# Patient Record
Sex: Male | Born: 1940 | Race: White | Hispanic: No | Smoking: Former smoker
Health system: Southern US, Community
[De-identification: ages and names within clinical notes are randomized; demographics above are authoritative.]

## PROBLEM LIST (undated history)

## (undated) DIAGNOSIS — I10 Essential (primary) hypertension: Secondary | ICD-10-CM

## (undated) DIAGNOSIS — E039 Hypothyroidism, unspecified: Secondary | ICD-10-CM

## (undated) DIAGNOSIS — I509 Heart failure, unspecified: Secondary | ICD-10-CM

## (undated) DIAGNOSIS — E119 Type 2 diabetes mellitus without complications: Secondary | ICD-10-CM

## (undated) DIAGNOSIS — E662 Morbid (severe) obesity with alveolar hypoventilation: Secondary | ICD-10-CM

## (undated) HISTORY — PX: OTHER SURGICAL HISTORY: SHX169

---

## 2019-09-08 ENCOUNTER — Other Ambulatory Visit: Payer: Self-pay

## 2019-09-08 ENCOUNTER — Inpatient Hospital Stay: Payer: Medicare Other

## 2019-09-08 ENCOUNTER — Inpatient Hospital Stay
Admission: EM | Admit: 2019-09-08 | Discharge: 2019-09-28 | DRG: 189 | Disposition: A | Discharge status: Hospice | Payer: Medicare Other | Attending: Internal Medicine | Admitting: Internal Medicine

## 2019-09-08 ENCOUNTER — Emergency Department: Payer: Medicare Other

## 2019-09-08 DIAGNOSIS — Z66 Do not resuscitate: Secondary | ICD-10-CM | POA: Diagnosis present

## 2019-09-08 DIAGNOSIS — D62 Acute posthemorrhagic anemia: Secondary | ICD-10-CM

## 2019-09-08 DIAGNOSIS — E662 Morbid (severe) obesity with alveolar hypoventilation: Secondary | ICD-10-CM | POA: Diagnosis present

## 2019-09-08 DIAGNOSIS — D500 Iron deficiency anemia secondary to blood loss (chronic): Secondary | ICD-10-CM | POA: Diagnosis not present

## 2019-09-08 DIAGNOSIS — I4819 Other persistent atrial fibrillation: Secondary | ICD-10-CM | POA: Diagnosis present

## 2019-09-08 DIAGNOSIS — E874 Mixed disorder of acid-base balance: Secondary | ICD-10-CM | POA: Diagnosis present

## 2019-09-08 DIAGNOSIS — I5082 Biventricular heart failure: Secondary | ICD-10-CM | POA: Diagnosis present

## 2019-09-08 DIAGNOSIS — E11649 Type 2 diabetes mellitus with hypoglycemia without coma: Secondary | ICD-10-CM | POA: Diagnosis present

## 2019-09-08 DIAGNOSIS — E875 Hyperkalemia: Secondary | ICD-10-CM

## 2019-09-08 DIAGNOSIS — Z79899 Other long term (current) drug therapy: Secondary | ICD-10-CM

## 2019-09-08 DIAGNOSIS — J440 Chronic obstructive pulmonary disease with acute lower respiratory infection: Secondary | ICD-10-CM | POA: Diagnosis present

## 2019-09-08 DIAGNOSIS — I272 Pulmonary hypertension, unspecified: Secondary | ICD-10-CM

## 2019-09-08 DIAGNOSIS — Z6841 Body Mass Index (BMI) 40.0 and over, adult: Secondary | ICD-10-CM | POA: Diagnosis not present

## 2019-09-08 DIAGNOSIS — Z95828 Presence of other vascular implants and grafts: Secondary | ICD-10-CM

## 2019-09-08 DIAGNOSIS — L89150 Pressure ulcer of sacral region, unstageable: Secondary | ICD-10-CM | POA: Diagnosis not present

## 2019-09-08 DIAGNOSIS — J159 Unspecified bacterial pneumonia: Secondary | ICD-10-CM | POA: Diagnosis present

## 2019-09-08 DIAGNOSIS — J9601 Acute respiratory failure with hypoxia: Principal | ICD-10-CM | POA: Diagnosis present

## 2019-09-08 DIAGNOSIS — I4891 Unspecified atrial fibrillation: Secondary | ICD-10-CM | POA: Insufficient documentation

## 2019-09-08 DIAGNOSIS — E861 Hypovolemia: Secondary | ICD-10-CM | POA: Diagnosis present

## 2019-09-08 DIAGNOSIS — L89154 Pressure ulcer of sacral region, stage 4: Secondary | ICD-10-CM | POA: Diagnosis present

## 2019-09-08 DIAGNOSIS — N2581 Secondary hyperparathyroidism of renal origin: Secondary | ICD-10-CM | POA: Diagnosis present

## 2019-09-08 DIAGNOSIS — Z880 Allergy status to penicillin: Secondary | ICD-10-CM

## 2019-09-08 DIAGNOSIS — D539 Nutritional anemia, unspecified: Secondary | ICD-10-CM | POA: Diagnosis present

## 2019-09-08 DIAGNOSIS — L89152 Pressure ulcer of sacral region, stage 2: Secondary | ICD-10-CM | POA: Diagnosis present

## 2019-09-08 DIAGNOSIS — J189 Pneumonia, unspecified organism: Secondary | ICD-10-CM

## 2019-09-08 DIAGNOSIS — K921 Melena: Secondary | ICD-10-CM | POA: Diagnosis not present

## 2019-09-08 DIAGNOSIS — Z7984 Long term (current) use of oral hypoglycemic drugs: Secondary | ICD-10-CM

## 2019-09-08 DIAGNOSIS — R0902 Hypoxemia: Secondary | ICD-10-CM | POA: Diagnosis not present

## 2019-09-08 DIAGNOSIS — W1811XA Fall from or off toilet without subsequent striking against object, initial encounter: Secondary | ICD-10-CM | POA: Diagnosis present

## 2019-09-08 DIAGNOSIS — B379 Candidiasis, unspecified: Secondary | ICD-10-CM | POA: Diagnosis present

## 2019-09-08 DIAGNOSIS — J9602 Acute respiratory failure with hypercapnia: Secondary | ICD-10-CM | POA: Diagnosis present

## 2019-09-08 DIAGNOSIS — I50811 Acute right heart failure: Secondary | ICD-10-CM | POA: Diagnosis not present

## 2019-09-08 DIAGNOSIS — Z452 Encounter for adjustment and management of vascular access device: Secondary | ICD-10-CM

## 2019-09-08 DIAGNOSIS — J690 Pneumonitis due to inhalation of food and vomit: Secondary | ICD-10-CM | POA: Diagnosis present

## 2019-09-08 DIAGNOSIS — N179 Acute kidney failure, unspecified: Secondary | ICD-10-CM | POA: Diagnosis not present

## 2019-09-08 DIAGNOSIS — N1832 Chronic kidney disease, stage 3b: Secondary | ICD-10-CM | POA: Diagnosis not present

## 2019-09-08 DIAGNOSIS — E039 Hypothyroidism, unspecified: Secondary | ICD-10-CM | POA: Diagnosis present

## 2019-09-08 DIAGNOSIS — N170 Acute kidney failure with tubular necrosis: Secondary | ICD-10-CM | POA: Diagnosis present

## 2019-09-08 DIAGNOSIS — N189 Chronic kidney disease, unspecified: Secondary | ICD-10-CM | POA: Diagnosis not present

## 2019-09-08 DIAGNOSIS — M899 Disorder of bone, unspecified: Secondary | ICD-10-CM | POA: Diagnosis present

## 2019-09-08 DIAGNOSIS — K259 Gastric ulcer, unspecified as acute or chronic, without hemorrhage or perforation: Secondary | ICD-10-CM | POA: Diagnosis present

## 2019-09-08 DIAGNOSIS — Z7189 Other specified counseling: Secondary | ICD-10-CM | POA: Diagnosis not present

## 2019-09-08 DIAGNOSIS — Z87891 Personal history of nicotine dependence: Secondary | ICD-10-CM

## 2019-09-08 DIAGNOSIS — I5033 Acute on chronic diastolic (congestive) heart failure: Secondary | ICD-10-CM | POA: Diagnosis present

## 2019-09-08 DIAGNOSIS — Z515 Encounter for palliative care: Secondary | ICD-10-CM | POA: Diagnosis not present

## 2019-09-08 DIAGNOSIS — J449 Chronic obstructive pulmonary disease, unspecified: Secondary | ICD-10-CM | POA: Diagnosis not present

## 2019-09-08 DIAGNOSIS — D649 Anemia, unspecified: Secondary | ICD-10-CM

## 2019-09-08 DIAGNOSIS — I248 Other forms of acute ischemic heart disease: Secondary | ICD-10-CM | POA: Diagnosis present

## 2019-09-08 DIAGNOSIS — E876 Hypokalemia: Secondary | ICD-10-CM | POA: Diagnosis not present

## 2019-09-08 DIAGNOSIS — Z79890 Hormone replacement therapy (postmenopausal): Secondary | ICD-10-CM

## 2019-09-08 DIAGNOSIS — I214 Non-ST elevation (NSTEMI) myocardial infarction: Secondary | ICD-10-CM | POA: Diagnosis present

## 2019-09-08 DIAGNOSIS — G9341 Metabolic encephalopathy: Secondary | ICD-10-CM | POA: Diagnosis present

## 2019-09-08 DIAGNOSIS — E1122 Type 2 diabetes mellitus with diabetic chronic kidney disease: Secondary | ICD-10-CM | POA: Diagnosis present

## 2019-09-08 DIAGNOSIS — R710 Precipitous drop in hematocrit: Secondary | ICD-10-CM | POA: Diagnosis not present

## 2019-09-08 DIAGNOSIS — I130 Hypertensive heart and chronic kidney disease with heart failure and stage 1 through stage 4 chronic kidney disease, or unspecified chronic kidney disease: Secondary | ICD-10-CM | POA: Diagnosis present

## 2019-09-08 DIAGNOSIS — I9589 Other hypotension: Secondary | ICD-10-CM | POA: Diagnosis present

## 2019-09-08 DIAGNOSIS — E872 Acidosis, unspecified: Secondary | ICD-10-CM

## 2019-09-08 DIAGNOSIS — T39395A Adverse effect of other nonsteroidal anti-inflammatory drugs [NSAID], initial encounter: Secondary | ICD-10-CM | POA: Diagnosis present

## 2019-09-08 DIAGNOSIS — R9389 Abnormal findings on diagnostic imaging of other specified body structures: Secondary | ICD-10-CM

## 2019-09-08 DIAGNOSIS — L270 Generalized skin eruption due to drugs and medicaments taken internally: Secondary | ICD-10-CM | POA: Diagnosis present

## 2019-09-08 DIAGNOSIS — L8990 Pressure ulcer of unspecified site, unspecified stage: Secondary | ICD-10-CM | POA: Insufficient documentation

## 2019-09-08 DIAGNOSIS — I82409 Acute embolism and thrombosis of unspecified deep veins of unspecified lower extremity: Secondary | ICD-10-CM

## 2019-09-08 DIAGNOSIS — E860 Dehydration: Secondary | ICD-10-CM | POA: Diagnosis present

## 2019-09-08 DIAGNOSIS — Z20822 Contact with and (suspected) exposure to covid-19: Secondary | ICD-10-CM | POA: Diagnosis present

## 2019-09-08 DIAGNOSIS — Z992 Dependence on renal dialysis: Secondary | ICD-10-CM

## 2019-09-08 DIAGNOSIS — G4733 Obstructive sleep apnea (adult) (pediatric): Secondary | ICD-10-CM | POA: Diagnosis not present

## 2019-09-08 DIAGNOSIS — E785 Hyperlipidemia, unspecified: Secondary | ICD-10-CM | POA: Diagnosis present

## 2019-09-08 DIAGNOSIS — Z538 Procedure and treatment not carried out for other reasons: Secondary | ICD-10-CM | POA: Diagnosis present

## 2019-09-08 DIAGNOSIS — D638 Anemia in other chronic diseases classified elsewhere: Secondary | ICD-10-CM | POA: Diagnosis present

## 2019-09-08 DIAGNOSIS — R809 Proteinuria, unspecified: Secondary | ICD-10-CM | POA: Diagnosis present

## 2019-09-08 DIAGNOSIS — Z9119 Patient's noncompliance with other medical treatment and regimen: Secondary | ICD-10-CM

## 2019-09-08 HISTORY — DX: Essential (primary) hypertension: I10

## 2019-09-08 HISTORY — DX: Heart failure, unspecified: I50.9

## 2019-09-08 HISTORY — DX: Hypothyroidism, unspecified: E03.9

## 2019-09-08 HISTORY — DX: Morbid (severe) obesity due to excess calories: E66.01

## 2019-09-08 HISTORY — DX: Type 2 diabetes mellitus without complications: E11.9

## 2019-09-08 HISTORY — DX: Morbid (severe) obesity with alveolar hypoventilation: E66.2

## 2019-09-08 LAB — COMPREHENSIVE METABOLIC PANEL
ALT: 16 U/L (ref 0–44)
AST: 25 U/L (ref 15–41)
Albumin: 3 g/dL — ABNORMAL LOW (ref 3.5–5.0)
Alkaline Phosphatase: 89 U/L (ref 38–126)
Anion gap: 8 (ref 5–15)
BUN: 94 mg/dL — ABNORMAL HIGH (ref 8–23)
CO2: 24 mmol/L (ref 22–32)
Calcium: 8 mg/dL — ABNORMAL LOW (ref 8.9–10.3)
Chloride: 108 mmol/L (ref 98–111)
Creatinine, Ser: 4.47 mg/dL — ABNORMAL HIGH (ref 0.61–1.24)
GFR calc Af Amer: 14 mL/min — ABNORMAL LOW (ref >60)
GFR calc non Af Amer: 12 mL/min — ABNORMAL LOW (ref >60)
Glucose, Bld: 88 mg/dL (ref 70–99)
Potassium: 5.3 mmol/L — ABNORMAL HIGH (ref 3.5–5.1)
Sodium: 140 mmol/L (ref 135–145)
Total Bilirubin: 1.5 mg/dL — ABNORMAL HIGH (ref 0.3–1.2)
Total Protein: 7.7 g/dL (ref 6.5–8.1)

## 2019-09-08 LAB — BLOOD GAS, VENOUS
Patient temperature: 37
pCO2, Ven: 59 mmHg (ref 44.0–60.0)
pH, Ven: 7.17 — CL (ref 7.250–7.430)
pO2, Ven: 50 mmHg — ABNORMAL HIGH (ref 32.0–45.0)

## 2019-09-08 LAB — CBC WITH DIFFERENTIAL/PLATELET
Abs Immature Granulocytes: 0.06 10*3/uL (ref 0.00–0.07)
Basophils Absolute: 0 10*3/uL (ref 0.0–0.1)
Basophils Relative: 0 %
Eosinophils Absolute: 0.1 10*3/uL (ref 0.0–0.5)
Eosinophils Relative: 1 %
HCT: 37.7 % — ABNORMAL LOW (ref 39.0–52.0)
Hemoglobin: 11.2 g/dL — ABNORMAL LOW (ref 13.0–17.0)
Immature Granulocytes: 1 %
Lymphocytes Relative: 8 %
Lymphs Abs: 0.8 10*3/uL (ref 0.7–4.0)
MCH: 30.9 pg (ref 26.0–34.0)
MCHC: 29.7 g/dL — ABNORMAL LOW (ref 30.0–36.0)
MCV: 104.1 fL — ABNORMAL HIGH (ref 80.0–100.0)
Monocytes Absolute: 0.8 10*3/uL (ref 0.1–1.0)
Monocytes Relative: 8 %
Neutro Abs: 8.9 10*3/uL — ABNORMAL HIGH (ref 1.7–7.7)
Neutrophils Relative %: 82 %
Platelets: 174 10*3/uL (ref 150–400)
RBC: 3.62 MIL/uL — ABNORMAL LOW (ref 4.22–5.81)
RDW: 17.7 % — ABNORMAL HIGH (ref 11.5–15.5)
WBC: 10.8 10*3/uL — ABNORMAL HIGH (ref 4.0–10.5)
nRBC: 0.4 % — ABNORMAL HIGH (ref 0.0–0.2)

## 2019-09-08 LAB — IRON AND TIBC
Iron: 32 ug/dL — ABNORMAL LOW (ref 45–182)
Saturation Ratios: 11 % — ABNORMAL LOW (ref 17.9–39.5)
TIBC: 290 ug/dL (ref 250–450)
UIBC: 258 ug/dL

## 2019-09-08 LAB — PROTEIN, URINE, RANDOM: Total Protein, Urine: 57 mg/dL

## 2019-09-08 LAB — BLOOD GAS, ARTERIAL
Acid-base deficit: 5.9 mmol/L — ABNORMAL HIGH (ref 0.0–2.0)
Bicarbonate: 22.1 mmol/L (ref 20.0–28.0)
FIO2: 40
O2 Saturation: 95 %
Patient temperature: 37
pCO2 arterial: 54 mmHg — ABNORMAL HIGH (ref 32.0–48.0)
pH, Arterial: 7.22 — ABNORMAL LOW (ref 7.350–7.450)
pO2, Arterial: 90 mmHg (ref 83.0–108.0)

## 2019-09-08 LAB — URINALYSIS, COMPLETE (UACMP) WITH MICROSCOPIC
Bacteria, UA: NONE SEEN
Bilirubin Urine: NEGATIVE
Glucose, UA: NEGATIVE mg/dL
Hgb urine dipstick: NEGATIVE
Ketones, ur: NEGATIVE mg/dL
Leukocytes,Ua: NEGATIVE
Nitrite: NEGATIVE
Protein, ur: 30 mg/dL — AB
Specific Gravity, Urine: 1.015 (ref 1.005–1.030)
Squamous Epithelial / HPF: NONE SEEN (ref 0–5)
pH: 5 (ref 5.0–8.0)

## 2019-09-08 LAB — RETICULOCYTES
Immature Retic Fract: 29.3 % — ABNORMAL HIGH (ref 2.3–15.9)
RBC.: 3.62 MIL/uL — ABNORMAL LOW (ref 4.22–5.81)
Retic Count, Absolute: 95.7 10*3/uL (ref 19.0–186.0)
Retic Ct Pct: 2.7 % (ref 0.4–3.1)

## 2019-09-08 LAB — GLUCOSE, CAPILLARY
Glucose-Capillary: 65 mg/dL — ABNORMAL LOW (ref 70–99)
Glucose-Capillary: 86 mg/dL (ref 70–99)
Glucose-Capillary: 86 mg/dL (ref 70–99)

## 2019-09-08 LAB — TROPONIN I (HIGH SENSITIVITY)
Troponin I (High Sensitivity): 181 ng/L (ref <18)
Troponin I (High Sensitivity): 445 ng/L (ref <18)

## 2019-09-08 LAB — FERRITIN: Ferritin: 32 ng/mL (ref 24–336)

## 2019-09-08 LAB — PROTIME-INR
INR: 1.2 (ref 0.8–1.2)
Prothrombin Time: 14.9 seconds (ref 11.4–15.2)

## 2019-09-08 LAB — HEPATITIS PANEL, ACUTE
HCV Ab: NONREACTIVE
Hep A IgM: NONREACTIVE
Hep B C IgM: NONREACTIVE
Hepatitis B Surface Ag: NONREACTIVE

## 2019-09-08 LAB — MAGNESIUM: Magnesium: 2.6 mg/dL — ABNORMAL HIGH (ref 1.7–2.4)

## 2019-09-08 LAB — APTT: aPTT: 34 seconds (ref 24–36)

## 2019-09-08 LAB — SODIUM, URINE, RANDOM: Sodium, Ur: 35 mmol/L

## 2019-09-08 LAB — BRAIN NATRIURETIC PEPTIDE: B Natriuretic Peptide: 280.3 pg/mL — ABNORMAL HIGH (ref 0.0–100.0)

## 2019-09-08 LAB — LACTIC ACID, PLASMA: Lactic Acid, Venous: 1.1 mmol/L (ref 0.5–1.9)

## 2019-09-08 LAB — CREATININE, URINE, RANDOM: Creatinine, Urine: 205 mg/dL

## 2019-09-08 LAB — SARS CORONAVIRUS 2 BY RT PCR (HOSPITAL ORDER, PERFORMED IN ~~LOC~~ HOSPITAL LAB): SARS Coronavirus 2: NEGATIVE

## 2019-09-08 LAB — PROCALCITONIN: Procalcitonin: <0.1 ng/mL

## 2019-09-08 LAB — FOLATE: Folate: 15.6 ng/mL (ref >5.9)

## 2019-09-08 LAB — SEDIMENTATION RATE: Sed Rate: 39 mm/hr — ABNORMAL HIGH (ref 0–20)

## 2019-09-08 MED ORDER — NOREPINEPHRINE 4 MG/250ML-% IV SOLN
INTRAVENOUS | Status: AC
  Administered 2019-09-08: 4 ug/min via INTRAVENOUS
  Filled 2019-09-08: qty 250

## 2019-09-08 MED ORDER — ATORVASTATIN CALCIUM 20 MG PO TABS
40.0000 mg | ORAL_TABLET | Freq: Every day | ORAL | Status: DC
  Administered 2019-09-08: 40 mg via ORAL
  Filled 2019-09-08: qty 2

## 2019-09-08 MED ORDER — INSULIN ASPART 100 UNIT/ML ~~LOC~~ SOLN: 0.0000 [IU] | Freq: Every day | SUBCUTANEOUS | Status: DC

## 2019-09-08 MED ORDER — NOREPINEPHRINE 4 MG/250ML-% IV SOLN
2.0000 ug/min | INTRAVENOUS | Status: DC
  Administered 2019-09-08: 10 ug/min via INTRAVENOUS
  Administered 2019-09-09: 12 ug/min via INTRAVENOUS
  Filled 2019-09-08: qty 250

## 2019-09-08 MED ORDER — AZITHROMYCIN 250 MG PO TABS
250.0000 mg | ORAL_TABLET | Freq: Every day | ORAL | Status: AC
  Administered 2019-09-10 – 2019-09-12 (×3): 250 mg via ORAL
  Filled 2019-09-08 (×4): qty 1

## 2019-09-08 MED ORDER — AZITHROMYCIN 500 MG PO TABS
500.0000 mg | ORAL_TABLET | Freq: Every day | ORAL | Status: AC
  Administered 2019-09-08: 500 mg via ORAL
  Filled 2019-09-08: qty 1

## 2019-09-08 MED ORDER — SENNA 8.6 MG PO TABS
1.0000 | ORAL_TABLET | Freq: Two times a day (BID) | ORAL | Status: DC
  Administered 2019-09-10 – 2019-09-28 (×27): 8.6 mg via ORAL
  Filled 2019-09-08 (×29): qty 1

## 2019-09-08 MED ORDER — SODIUM CHLORIDE 0.9 % IV SOLN: INTRAVENOUS | Status: DC

## 2019-09-08 MED ORDER — LEVOTHYROXINE SODIUM 50 MCG PO TABS
175.0000 ug | ORAL_TABLET | Freq: Every day | ORAL | Status: DC
  Administered 2019-09-08 – 2019-09-27 (×17): 175 ug via ORAL
  Filled 2019-09-08 (×3): qty 1
  Filled 2019-09-08: qty 4
  Filled 2019-09-08 (×2): qty 2
  Filled 2019-09-08: qty 1
  Filled 2019-09-08: qty 2
  Filled 2019-09-08: qty 1
  Filled 2019-09-08: qty 2
  Filled 2019-09-08 (×8): qty 1

## 2019-09-08 MED ORDER — ONDANSETRON HCL 4 MG PO TABS
4.0000 mg | ORAL_TABLET | Freq: Four times a day (QID) | ORAL | Status: DC | PRN
  Administered 2019-09-26: 4 mg via ORAL
  Filled 2019-09-08: qty 1

## 2019-09-08 MED ORDER — INSULIN ASPART 100 UNIT/ML ~~LOC~~ SOLN: 0.0000 [IU] | Freq: Three times a day (TID) | SUBCUTANEOUS | Status: DC

## 2019-09-08 MED ORDER — HEPARIN (PORCINE) 25000 UT/250ML-% IV SOLN
1600.0000 [IU]/h | INTRAVENOUS | Status: DC
  Administered 2019-09-08 – 2019-09-10 (×3): 1400 [IU]/h via INTRAVENOUS
  Administered 2019-09-12: 1800 [IU]/h via INTRAVENOUS
  Administered 2019-09-12: 1500 [IU]/h via INTRAVENOUS
  Filled 2019-09-08 (×8): qty 250

## 2019-09-08 MED ORDER — DEXTROSE-NACL 5-0.9 % IV SOLN: INTRAVENOUS | Status: DC

## 2019-09-08 MED ORDER — DEXTROSE 50 % IV SOLN
INTRAVENOUS | Status: AC
  Filled 2019-09-08: qty 50

## 2019-09-08 MED ORDER — ATORVASTATIN CALCIUM 20 MG PO TABS: 20.0000 mg | ORAL_TABLET | Freq: Every day | ORAL | Status: DC

## 2019-09-08 MED ORDER — ONDANSETRON HCL 4 MG/2ML IJ SOLN: 4.0000 mg | Freq: Four times a day (QID) | INTRAMUSCULAR | Status: DC | PRN

## 2019-09-08 MED ORDER — ALBUTEROL SULFATE (2.5 MG/3ML) 0.083% IN NEBU: 2.5000 mg | INHALATION_SOLUTION | RESPIRATORY_TRACT | Status: DC | PRN

## 2019-09-08 MED ORDER — ACETAMINOPHEN 650 MG RE SUPP: 650.0000 mg | Freq: Four times a day (QID) | RECTAL | Status: DC | PRN

## 2019-09-08 MED ORDER — SODIUM CHLORIDE 0.9 % IV BOLUS
250.0000 mL | Freq: Once | INTRAVENOUS | Status: AC
  Administered 2019-09-08: 250 mL via INTRAVENOUS

## 2019-09-08 MED ORDER — POLYETHYLENE GLYCOL 3350 17 G PO PACK: 17.0000 g | PACK | Freq: Every day | ORAL | Status: DC | PRN

## 2019-09-08 MED ORDER — FLUCONAZOLE IN SODIUM CHLORIDE 200-0.9 MG/100ML-% IV SOLN
200.0000 mg | INTRAVENOUS | Status: DC
  Administered 2019-09-09: 200 mg via INTRAVENOUS
  Filled 2019-09-08 (×2): qty 100

## 2019-09-08 MED ORDER — ASPIRIN 81 MG PO CHEW
324.0000 mg | CHEWABLE_TABLET | Freq: Once | ORAL | Status: AC
  Administered 2019-09-08: 324 mg via ORAL
  Filled 2019-09-08: qty 4

## 2019-09-08 MED ORDER — HEPARIN BOLUS VIA INFUSION
4000.0000 [IU] | Freq: Once | INTRAVENOUS | Status: AC
  Administered 2019-09-08: 4000 [IU] via INTRAVENOUS
  Filled 2019-09-08: qty 4000

## 2019-09-08 MED ORDER — LACTATED RINGERS IV BOLUS
1000.0000 mL | Freq: Once | INTRAVENOUS | Status: AC
  Administered 2019-09-08: 1000 mL via INTRAVENOUS

## 2019-09-08 MED ORDER — SODIUM CHLORIDE 0.9 % IV SOLN
1.0000 g | INTRAVENOUS | Status: DC
  Administered 2019-09-08: 1 g via INTRAVENOUS
  Filled 2019-09-08 (×2): qty 10

## 2019-09-08 MED ORDER — ACETAMINOPHEN 325 MG PO TABS: 650.0000 mg | ORAL_TABLET | Freq: Four times a day (QID) | ORAL | Status: DC | PRN

## 2019-09-08 MED ORDER — NYSTATIN 100000 UNIT/GM EX POWD
Freq: Once | CUTANEOUS | Status: AC
  Filled 2019-09-08: qty 15

## 2019-09-08 MED ORDER — ALBUTEROL SULFATE (2.5 MG/3ML) 0.083% IN NEBU
10.0000 mg | INHALATION_SOLUTION | Freq: Once | RESPIRATORY_TRACT | Status: AC
  Administered 2019-09-08: 10 mg via RESPIRATORY_TRACT
  Filled 2019-09-08: qty 12

## 2019-09-08 NOTE — ED Triage Notes (Signed)
Pt arrives via [facility] em[facility] from home, pt was having altered mental status, hypoglycemia of 38, room air sat of 67%, and hypotensive of 78/38. Pt received 25 grams of D10 and placed on a NS drip receiving 375 ml 12 lead showing right bundle branch block and a.fib.

## 2019-09-08 NOTE — ED Notes (Signed)
RN spoke with patient and MD Posey Pronto. Patient is a full code and NOT a DNR.

## 2019-09-08 NOTE — H&P (Addendum)
[facility] [place] NAME: Andrew Valentine    MR#:  [id number]  DATE OF BIRTH:  [birth year]  DATE OF ADMISSION:  09/08/2019  PRIMARY CARE PHYSICIAN: No primary care provider on file.   REQUESTING/REFERRING PHYSICIAN: Dr. Tamala Julian  Patient coming from : home  Patient lives alone. Daughter lives next door. He ambulate using walker. Does not wear oxygen  CHIEF COMPLAINT:   Patient is a very poor historian. No family in the ER. Unable to reach any family no contact information available  Brought in for altered mental status and hypoxia HISTORY OF PRESENT ILLNESS:  Andrew Valentine  is a 79 y.o. male with a known history of morbid obesity, sleep apnea intolerant noncompliant to CPAP, hypertension, diabetes, hyperlipidemia, hypothyroidism, remote history of tobacco abuse comes to the emergency room with altered mental status and hypoxia.  Patient tells me he fell from the commode could not get up. He was very short winded. Daughter called EMTs. His sats were found in the 60s. Patient is hard on hearing not able to give much history. His sugars were "low" per EMS received D50 and in the ER on dextrose drip.  ED course: in the ER patient is afebrile. Heart rate is 66 -111. His daily rhythm was noted to be a fib. ER physician start patient on IV heparin drip troponin 181 BNP 280 white count 10.8 creatinine 4.4 (unknown baseline) BNP 280, lactic acid 1.1 COVID negative chest x-ray shows BILATERAL pulmonary infiltrates favoring pneumonia. Probable small LEFT pleural effusion   Pro calcitonin pending.  Patient is being admitted with acute hypoxic respiratory failure, acute renal failure, hypoglycemia, elevated troponin, candidiasis PAST MEDICAL HISTORY:  HTN Dm-2 HL Hypothyroidism OSA  PAST SURGICAL HISTOIRY:  GB surgery  SOCIAL HISTORY:   Social History   Tobacco Use  . Smoking status: Not on file  Substance Use Topics  . Alcohol use: Not on  file    FAMILY HISTORY:  No family history on file.  DRUG ALLERGIES:   Allergies  Allergen Reactions  . Penicillins Rash    REVIEW OF SYSTEMS:  Review of Systems  Constitutional: Negative for chills, fever and weight loss.  HENT: Negative for ear discharge, ear pain and nosebleeds.   Eyes: Negative for blurred vision, pain and discharge.  Respiratory: Positive for shortness of breath. Negative for sputum production, wheezing and stridor.   Cardiovascular: Negative for chest pain, palpitations, orthopnea and PND.  Gastrointestinal: Negative for abdominal pain, diarrhea, nausea and vomiting.  Genitourinary: Negative for frequency and urgency.  Musculoskeletal: Positive for falls and joint pain. Negative for back pain.  Neurological: Positive for weakness. Negative for sensory change, speech change and focal weakness.  Psychiatric/Behavioral: Negative for depression and hallucinations. The patient is not nervous/anxious.      MEDICATIONS AT HOME:   Prior to Admission medications   Medication Sig Start Date End Date Taking? Authorizing Provider  atenolol (TENORMIN) 100 MG tablet Take 100 mg by mouth daily. 08/16/19   [provider]  atorvastatin (LIPITOR) 40 MG tablet Take 40 mg by mouth daily. 08/16/19   [provider]  glipiZIDE (GLUCOTROL) 5 MG tablet Take 5 mg by mouth daily. 08/16/19   [provider]  hydrochlorothiazide (HYDRODIURIL) 25 MG tablet Take 25 mg by mouth daily. 08/16/19   [provider]  levothyroxine (SYNTHROID) 175 MCG tablet Take 175 mcg by mouth daily. 08/16/19   [provider]  lisinopril (ZESTRIL) 5 MG tablet Take 5  mg by mouth daily. 08/16/19   [provider]      VITAL SIGNS:  Blood pressure 93/68, pulse 66, resp. rate 18, height 5\' 8"  (1.727 m), weight (!) 149.7 kg, SpO2 94 %.  PHYSICAL EXAMINATION:  GENERAL:  79 y.o.-year-old patient lying in the bed with no acute distress. Morbid obese EYES: Pupils  equal, round, reactive to light and accommodation. No scleral icterus.  HEENT: Head atraumatic, normocephalic. Oropharynx and nasopharynx clear.  LUNGS: Normal breath sounds bilaterally, no wheezing, rales,rhonchi or crepitation. No use of accessory muscles of respiration.  CARDIOVASCULAR: S1, S2 normal. No murmurs, rubs, or gallops.  ABDOMEN: Soft, nontender, nondistended. Abdominal obesity bowel sounds present. No organomegaly or mass.  Significant candidial skin rash and abdominal folds EXTREMITIES: + pedal edema, no cyanosis, or clubbing.  Left foot bruise after fall  POA aug 28th    NEUROLOGIC: Cranial nerves II through XII are intact. Muscle strength 5/5 in all extremities. Sensation intact. Gait not checked. weak PSYCHIATRIC: The patient is alert and oriented x 1-- hard on hearing SKIN: as above  LABORATORY PANEL:   CBC Recent Labs  Lab 09/08/19 1205  WBC 10.8*  HGB 11.2*  HCT 37.7*  PLT 174   ------------------------------------------------------------------------------------------------------------------  Chemistries  Recent Labs  Lab 09/08/19 1205  NA 140  K 5.3*  CL 108  CO2 24  GLUCOSE 88  BUN 94*  CREATININE 4.47*  CALCIUM 8.0*  MG 2.6*  AST 25  ALT 16  ALKPHOS 89  BILITOT 1.5*   ------------------------------------------------------------------------------------------------------------------  Cardiac Enzymes No results for input(s): TROPONINI in the last 168 hours. ------------------------------------------------------------------------------------------------------------------  RADIOLOGY:  DG Chest Portable 1 View  Result Date: 09/08/2019 CLINICAL DATA:  Oxygen desaturation, hypoglycemia, shortness of breath, history COPD, CHF EXAM: PORTABLE CHEST 1 VIEW COMPARISON:  Portable exam 1224 hours without priors for comparison FINDINGS: Upper normal heart size. Mediastinal contours normal. Patchy infiltrates bilaterally greater on RIGHT, question  pneumonia, asymmetric edema considered less likely. Probable small LEFT pleural effusion. No pneumothorax. IMPRESSION: BILATERAL pulmonary infiltrates favoring pneumonia. Probable small LEFT pleural effusion. Electronically Signed   By: Lavonia Dana M.D.   On: 09/08/2019 13:06    EKG:    IMPRESSION AND PLAN:   Kashden Deboy  is a 79 y.o. male with a known history of morbid obesity, sleep apnea intolerant noncompliant to CPAP, hypertension, diabetes, hyperlipidemia, hypothyroidism, remote history of tobacco abuse comes to the emergency room with altered mental status and hypoxia.  Acute metabolic encephalopathy Hypoglycemia, hypoxia, uremia  admit to medical floor -IV dextrose drip -check sugar Q3 hourly  Acute hypoxic respiratory failure suspected due to pneumonia versus CHF -empiric antibiotic with IV Rocephin and Zithromax -follow Pro calcitonin -blood culture -de-escalate antibiotics when able to -mild elevated white count of 10.8, no fever -COVID negative -given elevated creatinine holding of Lasix -cardiology consultation with Dr. Rockey Situ-- message sent  A fib-- unsure if it is chronic or acute -patient currently on heparin drip (started in the ER) -heart rate in the 60s -cardiology consultation placed  Acute renal failure-- unknown baseline creatinine -came in with creatinine of 4.4 with BUN 94 -cont IV fluids -US renal -monitor input output -avoid nephrotoxic agents -nephrology consultation with Dr Theador Hawthorne-- informed  Severe candidiasis of abdominal skin folds and sacrum/buttock -IV Diflucan for seven days  Chronic sacral decubitus-- present on admission -wound consult  Type II diabetes, hypoglycemia, non-insulin-dependent -sliding scale insulin once patient is off D10 -diabetes coordinator -patient takes glipizide at home Currently on  IV d10 gtt  History of hypertension relative hypotension -hold atenolol and lisinopril  hyperlipidemia  -continue  atorvastatin  Hypothyroidism - continue Synthroid   Family Communication : unable to reach daughter. No phone number in the medical records Consults : cardiology, nephrology, wound Code Status : full  DVT prophylaxis : heparin drip admission status: inpatient TOTAL TIME TAKING CARE OF THIS PATIENT: *55 minutes.    Fritzi Mandes M.D  [facility]     CC: Primary care physician; No primary care provider on file.

## 2019-09-08 NOTE — ED Provider Notes (Signed)
[facility] Emergency Department Provider Note ____________________________________________   First MD Initiated Contact with Patient 09/08/19 1149     (approximate)  I have reviewed the triage vital signs and the nursing notes.  HISTORY  Chief Complaint Hypotension, Hypoglycemia, and Altered Mental Status   HPI Andrew Valentine is a 79 y.o. malewho presents to the ED for evaluation of altered mental status, hypoglycemia and hypotension.  Chart review indicates no medical history with our system. No family contact permission available. Patient reports his daughter is his primary caregiver.  EMS reports 3-4 calls to this patient's house over the past 3 days, frequently for short of breath, and patient has always refused transport. They report finding him altered today hypoglycemic, hypoxic and hypotensive on presentation. EMS started IV access provided 500 cc of fluid, D50 and NRB.  History is limited due to acuity of condition and patient's altered mental status. He indicates that he does not know what happened this morning.  Denies pain at this time.  No past medical history on file.  Patient Active Problem List   Diagnosis Date Noted  . Acute hypoxemic respiratory failure ([place]) 09/08/2019  . Acute renal failure with tubular necrosis ([place])   . Hypoxia   . Hypotension due to hypovolemia   . Atrial fibrillation ([place])       Prior to Admission medications   Medication Sig Start Date End Date Taking? Authorizing Provider  atenolol (TENORMIN) 100 MG tablet Take 100 mg by mouth daily. 08/16/19   [provider]  atorvastatin (LIPITOR) 40 MG tablet Take 40 mg by mouth daily. 08/16/19   [provider]  glipiZIDE (GLUCOTROL) 5 MG tablet Take 5 mg by mouth daily. 08/16/19   [provider]  hydrochlorothiazide (HYDRODIURIL) 25 MG tablet Take 25 mg by mouth daily. 08/16/19   [provider]  levothyroxine (SYNTHROID) 175 MCG tablet Take  175 mcg by mouth daily. 08/16/19   [provider]  lisinopril (ZESTRIL) 5 MG tablet Take 5 mg by mouth daily. 08/16/19   [provider]    Allergies Penicillins  No family history on file.  Social History Social History   Tobacco Use  . Smoking status: Not on file  Substance Use Topics  . Alcohol use: Not on file  . Drug use: Not on file    Review of Systems  Unable to be accurately obtained due to patient's altered medical status and acuity of condition ____________________________________________   PHYSICAL EXAM:  VITAL SIGNS: Vitals:   09/08/19 1500 09/08/19 1515  BP: (!) 89/54 95/77  Pulse: 66 72  Resp: 16 20  SpO2: 100% 96%      Constitutional: Alert but disoriented to place and situation.  Follows commands in all 4 extremities.  Obese. Eyes: Conjunctivae are normal. PERRL. EOMI. Head: Atraumatic. Nose: No congestion/rhinnorhea. Mouth/Throat: Mucous membranes are dry.  Oropharynx non-erythematous. Neck: No stridor. No cervical spine tenderness to palpation. Cardiovascular: Normal rate, regular rhythm. Grossly normal heart sounds.  Good peripheral circulation. Respiratory: Tachypnea to the mid 20s, deescalated down to nasal cannula from EMS NRB.  He is conversational and speaking in full sentences. Gastrointestinal: Soft , nondistended, nontender to palpation. No abdominal bruits. No CVA tenderness. Erythema and irritation to his pannus Musculoskeletal: No lower extremity tenderness.  No signs of acute trauma. Neurologic:  Normal speech and language. No gross focal neurologic deficits are appreciated Skin:  No rash noted. Psychiatric: Mood and affect are normal. Speech and behavior are normal.  ____________________________________________   LABS (all labs ordered are listed, but only abnormal results are displayed)  Labs Reviewed  GLUCOSE, CAPILLARY - Abnormal; Notable for the following components:      Result Value   Glucose-Capillary  65 (*)    All other components within normal limits  CBC WITH DIFFERENTIAL/PLATELET - Abnormal; Notable for the following components:   WBC 10.8 (*)    RBC 3.62 (*)    Hemoglobin 11.2 (*)    HCT 37.7 (*)    MCV 104.1 (*)    MCHC 29.7 (*)    RDW 17.7 (*)    nRBC 0.4 (*)    Neutro Abs 8.9 (*)    All other components within normal limits  COMPREHENSIVE METABOLIC PANEL - Abnormal; Notable for the following components:   Potassium 5.3 (*)    BUN 94 (*)    Creatinine, Ser 4.47 (*)    Calcium 8.0 (*)    Albumin 3.0 (*)    Total Bilirubin 1.5 (*)    GFR calc non Af Amer 12 (*)    GFR calc Af Amer 14 (*)    All other components within normal limits  BRAIN NATRIURETIC PEPTIDE - Abnormal; Notable for the following components:   B Natriuretic Peptide 280.3 (*)    All other components within normal limits  MAGNESIUM - Abnormal; Notable for the following components:   Magnesium 2.6 (*)    All other components within normal limits  URINALYSIS, COMPLETE (UACMP) WITH MICROSCOPIC - Abnormal; Notable for the following components:   Color, Urine AMBER (*)    APPearance CLOUDY (*)    Protein, ur 30 (*)    All other components within normal limits  BLOOD GAS, VENOUS - Abnormal; Notable for the following components:   pH, Ven 7.17 (*)    pO2, Ven 50.0 (*)    All other components within normal limits  BLOOD GAS, ARTERIAL - Abnormal; Notable for the following components:   pH, Arterial 7.22 (*)    pCO2 arterial 54 (*)    Acid-base deficit 5.9 (*)    All other components within normal limits  TROPONIN I (HIGH SENSITIVITY) - Abnormal; Notable for the following components:   Troponin I (High Sensitivity) 181 (*)    All other components within normal limits  SARS CORONAVIRUS 2 BY RT PCR (HOSPITAL ORDER, [facility])  CULTURE, BLOOD (ROUTINE X 2)  CULTURE, BLOOD (ROUTINE X 2)  LACTIC ACID, PLASMA  APTT  PROTIME-INR  PROCALCITONIN  HEPARIN LEVEL (UNFRACTIONATED)    ____________________________________________  12 Lead EKG  Atrial fibrillation, rate of 63 bpm.  Normal axis.  Evidence of a right bundle and left anterior fascicular blocks, otherwise intervals are appropriate.  T wave inversions to anterior and septal leads of unknown chronicity, no evidence of acute STEMI criteria ____________________________________________  RADIOLOGY  ED MD interpretation: 1 view CXR reviewed and with evidence of pulmonary vascular congestion and a small left pleural effusion  Official radiology report(s): DG Chest Portable 1 View  Result Date: 09/08/2019 CLINICAL DATA:  Oxygen desaturation, hypoglycemia, shortness of breath, history COPD, CHF EXAM: PORTABLE CHEST 1 VIEW COMPARISON:  Portable exam 1224 hours without priors for comparison FINDINGS: Upper normal heart size. Mediastinal contours normal. Patchy infiltrates bilaterally greater on RIGHT, question pneumonia, asymmetric edema considered less likely. Probable small LEFT pleural effusion. No pneumothorax. IMPRESSION: BILATERAL pulmonary infiltrates favoring pneumonia. Probable small LEFT pleural effusion. Electronically Signed   By: Lavonia Dana M.D.   On:  09/08/2019 13:06    ____________________________________________   PROCEDURES and INTERVENTIONS  Procedure(s) performed (including Critical Care):  .Critical Care Performed by: Vladimir Crofts, MD Authorized by: Vladimir Crofts, MD   Critical care provider statement:    Critical care time (minutes):  45   Critical care was necessary to treat or prevent imminent or life-threatening deterioration of the following conditions:  Cardiac failure, respiratory failure, renal failure and shock   Critical care was time spent personally by me on the following activities:  Discussions with consultants, evaluation of patient's response to treatment, examination of patient, ordering and performing treatments and interventions, ordering and review of laboratory studies,  ordering and review of radiographic studies, pulse oximetry, re-evaluation of patient's condition, obtaining history from patient or surrogate and review of old charts    Medications  dextrose 50 % solution (0 g  Hold 09/08/19 1209)  dextrose 5 %-0.9 % sodium chloride infusion ( Intravenous New Bag/Given 09/08/19 1208)  nystatin (MYCOSTATIN/NYSTOP) topical powder ( Topical Not Given 09/08/19 1210)  heparin ADULT infusion 100 units/mL (25000 units/269mL sodium chloride 0.45%) (1,400 Units/hr Intravenous New Bag/Given 09/08/19 1401)  cefTRIAXone (ROCEPHIN) 1 g in sodium chloride 0.9 % 100 mL IVPB (has no administration in time range)  azithromycin (ZITHROMAX) tablet 500 mg (has no administration in time range)    Followed by  azithromycin (ZITHROMAX) tablet 250 mg (has no administration in time range)  lactated ringers bolus 1,000 mL (1,000 mLs Intravenous New Bag/Given 09/08/19 1312)  albuterol (PROVENTIL) (2.5 MG/3ML) 0.083% nebulizer solution 10 mg (10 mg Nebulization Given 09/08/19 1312)  aspirin chewable tablet 324 mg (324 mg Oral Given 09/08/19 1335)  heparin bolus via infusion 4,000 Units (4,000 Units Intravenous Bolus from Bag 09/08/19 1402)    ____________________________________________   MDM / ED COURSE  79 year old male presents from home with recurrent hypoglycemia, hypoxia and hypotension, found to have NSTEMI, acute renal failure requiring medical admission.  Patient initially hypotensive, but responsive to fluids and did not require pressors.  While he required NRB with EMS, he only needs nasal cannula here to maintain normoxia.  Exam demonstrates an obese patient without neurovascular deficits or distress.  EMS provides D50 on route, but he has hypoglycemia upon presentation here to the ED in the 60s, necessitating D5 normal saline drip.  After correction of his oxygen and hypoglycemia, patient is conversational in full sentences without distress.  He reports metabolic history, but  cannot provide any further history of present illness as he does not remember what happened this morning.  He further cannot give any contact information for family to assess collateral information.  Drew blood cultures and initiated heparin drip to treat NSTEMI due to his elevated troponin, in the setting of a nonischemic EKG without STEMI criteria, and will admit the patient to hospitalist medicine for further work-up and management of his metabolic derangements and hypoxia.  Clinical Course as of Sep 07 1529  Sat Sep 08, 2019  1212 Dextrose containing fluids ongoing due to fingerstick in the 60s.  Blood pressure 90/60, but not tachycardic   [DS]  1225 Reassessed.  Maps in the low 70s.  Mentation a little bit sharper.  Weaned down to nasal cannula.  I assisted x-ray techs with acquisition of CXR.Patient reports history of HTN, HLD, diabetes, denies blood thinners and denies heart stents   [DS]  13 Callback from hospitalist, Dr. Fritzi Mandes, who requests we wait until COVID-19 swab is resulted before disposition.   [DS]    Clinical Course  User Index [DS] Vladimir Crofts, MD   ____________________________________________   FINAL CLINICAL IMPRESSION(S) / ED DIAGNOSES  Final diagnoses:  AKI (acute kidney injury) ([place])  Hypotension due to hypovolemia  Hypoxia  Acute renal failure with tubular necrosis ([facility])  Hyperkalemia  Acidosis  NSTEMI (non-ST elevated myocardial infarction) [facility])     ED Discharge Orders    None       Dez Stauffer   Note:  This document was prepared using Dragon voice recognition software and may include unintentional dictation errors.   Vladimir Crofts, MD 09/08/19 1536

## 2019-09-08 NOTE — ED Notes (Signed)
Pt sats fluctuating on 5 L [place], 85-92 %, pt placed on nonrebreather, pt sats at 95-98% on 10 L non rebreather

## 2019-09-08 NOTE — ED Notes (Addendum)
Levo restarted, BP 73/59; RN is in communication with Dr. Sidney Ace (floor coverage), he is arranging for consult with ICU since level of care is currently Progressive Cardiac

## 2019-09-08 NOTE — Progress Notes (Signed)
Patient ID: Andrew Valentine, male   DOB: 1940-03-01, 79 y.o.   MRN: [id number]  Spoke with patient's daughter Donaciano Eva on the phone.  Daughter reports patient has been feeling weak on and off for a week. Poor PO intake. Drinks flavored water only, not taking his medications appropriately for last week. Having hard time getting around the house.  She denies any history of a fib or any issues related to his kidneys.  Discussed code status daughter says patient would want to be DNR.  20 mins

## 2019-09-08 NOTE — Consult Note (Signed)
Andrew Valentine MRN: [id number] DOB/AGE: 02-03-1940 79 y.o. Primary Care Physician:[facility] [employer] [facility] date: 09/08/2019 Chief Complaint:  Chief Complaint  Patient presents with  . Hypotension  . Hypoglycemia  . Altered Mental Status   HPI: Patient is a 79 year old Caucasian male with a past medical history of morbid obesity, sleep apnea, hypertension, diabetes mellitus, hyperlipidemia, hypothyroidism who was brought to the ER with chief complaint of altered mental status.  History has been obtained from medical record/patient's daughter/patient.  History of present illness  dates back to a week ago When as per patient's daughter patient started feeling weak, it was progressive in nature.  Patient also had decreased p.o. intake.  This morning patient was found to be confused and EMS was called.  Upon evaluation by the EMS at home patient blood glucose was found to be 38, patient was hypoxic with room air saturation of only 67% and was hypotensive with systolic blood pressure less than 80. Patient was brought to the ER.  Upon evaluation in the ER patient was found to have creatinine of 4.4.  Patient had chest x-ray done which showed bilateral pulmonary infiltrates-pneumonia. Patient was admitted for further care.  Patient was seen in the ER. Patient does give a history that he has not been eating or drinking well from past few days when asked a direct question when was his last good meal patient said few days ago Patient does complain of feeling weak No complaint of chest pain Patient does complain of shortness of breath No complaint of fever cough or chills No recent Covid contacts No complaint of nausea vomiting or diarrhea No complaint of increased frequency urgency or dysuria   No past medical history on file.  HTN Dm-2 HL Hypothyroidism OSA    No family history on file. No family history of ESRD   Social History:  has no history on file for tobacco use, alcohol  use, and drug use.   Allergies:  Allergies  Allergen Reactions  . Penicillins Rash    (Not in a hospital admission)      MOQ:HUTML from the symptoms mentioned above,there are no other symptoms referable to all systems reviewed.  Andrew Valentine ON 09/09/2019] azithromycin  250 mg Oral Daily  . dextrose      . nystatin   Topical Once          Physical Exam: Vital signs in last 24 hours: Pulse Rate:  [33-76] 67 (08/28 1604) Resp:  [14-36] 16 (08/28 1604) BP: (70-101)/(42-81) 93/46 (08/28 1604) SpO2:  [74 %-100 %] 98 % (08/28 1604) Weight:  [149.7 kg] 149.7 kg (08/28 1206) Weight change:     Intake/Output from previous day: No intake/output data recorded. Total I/O In: 1000 [IV Piggyback:1000] Out: 300 [Urine:300]   Physical Exam: General- pt is awake,alert, oriented to time place and person Resp- No acute REsp distress, CTA B/L NO Rhonchi, decreased breath sound at bases CVS- S1S2 irregular inrate and rhythm GIT- BS+, soft, NT, ND, morbidly obese EXT- NO LE Edema, no cyanosis CNS- CN 2-12 grossly intact. Moving all 4 extremities Psych- normal mod and affect    Lab Results: CBC Recent Labs    09/08/19 1205  WBC 10.8*  HGB 11.2*  HCT 37.7*  PLT 174    BMET Recent Labs    09/08/19 1205  NA 140  K 5.3*  CL 108  CO2 24  GLUCOSE 88  BUN 94*  CREATININE 4.47*  CALCIUM 8.0*    MICRO Recent Results (  from the past 240 hour(s))  SARS Coronavirus 2 by RT PCR (hospital order, performed in [facility] hospital lab) Nasopharyngeal Nasopharyngeal Swab     Status: None   Collection Time: 09/08/19 12:05 PM   Specimen: Nasopharyngeal Swab  Result Value Ref Range Status   SARS Coronavirus 2 NEGATIVE NEGATIVE Final    Comment: (NOTE) SARS-CoV-2 target nucleic acids are NOT DETECTED.  The SARS-CoV-2 RNA is generally detectable in upper and lower respiratory specimens during the acute phase of infection. The lowest concentration of SARS-CoV-2 viral copies  this assay can detect is 250 copies / mL. A negative result does not preclude SARS-CoV-2 infection and should not be used as the sole basis for treatment or other patient management decisions.  A negative result may occur with improper specimen collection / handling, submission of specimen other than nasopharyngeal swab, presence of viral mutation(s) within the areas targeted by this assay, and inadequate number of viral copies (<250 copies / mL). A negative result must be combined with clinical observations, patient history, and epidemiological information.  Fact Sheet for Patients:   StrictlyIdeas.no  Fact Sheet for Healthcare Providers: BankingDealers.co.za  This test is not yet approved or  cleared by the [place] FDA and has been authorized for detection and/or diagnosis of SARS-CoV-2 by FDA under an Emergency Use Authorization (EUA).  This EUA will remain in effect (meaning this test can be used) for the duration of the COVID-19 declaration under Section 564(b)(1) of the Act, 21 U.S.C. section 360bbb-3(b)(1), unless the authorization is terminated or revoked sooner.  Performed at [facility], [address] [place], [place] [address]   Blood culture (routine x 2)     Status: None (Preliminary result)   Collection Time: 09/08/19 12:07 PM   Specimen: BLOOD  Result Value Ref Range Status   Specimen Description BLOOD L DISTAL FOREARM  Final   Special Requests   Final    BOTTLES DRAWN AEROBIC AND ANAEROBIC Blood Culture results may not be optimal due to an excessive volume of blood received in culture bottles   Culture   Final    NO GROWTH <12 HOURS Performed at [facility], [address] [place], [place] [address]    Report Status PENDING  Incomplete  Blood culture (routine x 2)     Status: None (Preliminary result)   Collection Time: 09/08/19 12:59 PM   Specimen: BLOOD  Result Value Ref Range  Status   Specimen Description BLOOD LEFT UPPER FORARM  Final   Special Requests   Final    BOTTLES DRAWN AEROBIC AND ANAEROBIC Blood Culture results may not be optimal due to an excessive volume of blood received in culture bottles   Culture   Final    NO GROWTH <12 HOURS Performed at [facility], [address] [place], [place] [address]    Report Status PENDING  Incomplete      Lab Results  Component Value Date   CALCIUM 8.0 (L) 09/08/2019      Impression: 1)Renal  AKI secondary to ATN Patient has AKI secondary to hypovolemia/hypotension/ACE/diuretics on board AKI on CKD CKD? CKD data is not available Will initiate CKD/AKI work-up  Patient UA is positive for protein We will quantify patient proteinuria  2) hypotension Patient is currently on IV fluids Patient is not requiring IV pressors at this time  3)Anemia of chronic disease? Unclear etiology of anemia  HGb at goal (9--11) We will ask for anemia work-up  4) secondary hyperparathyroidism  CKD Mineral-Bone Disorder PTH not avail  secondary Hyperparathyroidism w/u pending   5) pneumonia Patient is on broad-spectrum antibiotics Primary MD is following  6) hyperkalemia Most likely secondary to AKI, CKD and is on board We will start patient on [place]   7)Acid base Patient has respiratory acidosis Patient pH is lower than expected for patient PCO2   Results for HESSTON, HITCHENS (MRN [id number]) as of 09/08/2019 16:53  Ref. Range 09/08/2019 12:53  pH, Arterial Latest Ref Range: 7.35 - 7.45  7.22 (L)  pCO2 arterial Latest Ref Range: 32 - 48 mmHg 54 (H)  pO2, Arterial Latest Ref Range: 83 - 108 mmHg 90  Acid-base deficit Latest Ref Range: 0.0 - 2.0 mmol/L 5.9 (H)  Bicarbonate Latest Ref Range: 20.0 - 28.0 mmol/L 22.1  O2 Saturation Latest Units: % 95.0  Patient temperature Unknown 37.0   Patient most likely has acute on chronic respiratory acidosis, metabolic acidosis and metabolic alkalosis   8)  troponin elevated Patient troponins are rising Patient is on IV heparin  Plan:  We will ask for AKI work-up-FENa  We will ask for CKD work-up As etiology of CKD is unclear we will ask for autoimmune work-up  We will ask for anemia work-up  We will ask for renal ultrasound  We will ask for urine spot protein/creatinine ratio  We will ask for serum phosphorus   We will suggest strict INO's   Loida Calamia s Andrew Valentine 09/08/2019, 4:53 PM

## 2019-09-08 NOTE — H&P (Signed)
The patient's blood pressure dropped to systolic of 37C after holding Levophed briefly.  It will be restarted again.  I discussed the case with the ED link intensivist.  The patient will be going to the ICU.  An intensivist consult was placed.

## 2019-09-08 NOTE — Progress Notes (Signed)
Patient ID: Andrew Valentine, male   DOB: 03-30-1940, 79 y.o.   MRN: [id number]  ER RN discussed with pt about code status. Pt per RN wants to be FULL CODE I called der again and told her about it and will keep full code till for now. BP remains low despite IV NS 250 cc bolus. Will start levophed gtt and admit to step down  CCU charge aware of step down admit.

## 2019-09-08 NOTE — ED Notes (Addendum)
Dr. Posey Pronto notified of pt's bp and change in O2 requirement. NS bolus given per Dr. Posey Pronto

## 2019-09-08 NOTE — ED Notes (Signed)
DNR armband placed

## 2019-09-08 NOTE — Consult Note (Addendum)
ANTICOAGULATION CONSULT NOTE - Initial Consult  Pharmacy Consult for heparin dosing  Indication: chest pain/ACS  Allergies  Allergen Reactions  . Penicillins Rash    Patient Measurements: Height: 5\' 8"  (172.7 cm) Weight: (!) 149.7 kg (330 lb) IBW/kg (Calculated) : 68.4 Heparin Dosing Weight: 104.8  Vital Signs: BP: 101/81 (08/28 1330) Pulse Rate: 63 (08/28 1330)  Labs: Recent Labs    09/08/19 1205  HGB 11.2*  HCT 37.7*  PLT 174  CREATININE 4.47*  TROPONINIHS 181*    Estimated Creatinine Clearance: 19.1 mL/min (A) (by C-G formula based on SCr of 4.47 mg/dL (H)).   Medical History: No past medical history on file.  Medications:  Scheduled:  . dextrose      . heparin  4,000 Units Intravenous Once  . nystatin   Topical Once    Assessment: 79 year old male presenting to the ED with AMS, hypoglycemia, and hypotension. Troponins 181, hgb 11.2, plt 174.  Goal of Therapy:  Heparin level 0.3-0.7 units/ml Monitor platelets by anticoagulation protocol: Yes   Plan:  Give 4000 units bolus x 1 Start heparin infusion at 1400 units/hr Check HL 8 hours after start of infusion Continue to monitor H&H and platelets with AM labs   Benn Moulder, PharmD Pharmacy Resident  09/08/2019 1:47 PM

## 2019-09-08 NOTE — ED Notes (Signed)
Patient has been cleaned and pressure pad has been placed on buttocks.

## 2019-09-08 NOTE — ED Notes (Signed)
Pt with skin tears to right outer upper arm x 2, nonstick dressing applied. Pt with wound to top off left foot, happened at home per pt. Nonstick dressing applied. Pt with multiple bruises, dry rough skin over entire body. Pt with raw, red, weeping areas in skin folds on abdomen. Pt with stage 2 decubitus on sacrum, sacral pad applied.

## 2019-09-08 NOTE — ED Notes (Addendum)
Levo stopped due to rising BP 162/151

## 2019-09-09 ENCOUNTER — Encounter: Payer: Self-pay | Admitting: Internal Medicine

## 2019-09-09 ENCOUNTER — Inpatient Hospital Stay (HOSPITAL_COMMUNITY)
Admit: 2019-09-09 | Discharge: 2019-09-09 | Disposition: A | Discharge status: Home / Self Care | Payer: Medicare Other | Attending: Cardiovascular Disease | Admitting: Cardiovascular Disease

## 2019-09-09 ENCOUNTER — Inpatient Hospital Stay: Payer: Medicare Other

## 2019-09-09 DIAGNOSIS — E861 Hypovolemia: Secondary | ICD-10-CM

## 2019-09-09 DIAGNOSIS — I4891 Unspecified atrial fibrillation: Secondary | ICD-10-CM

## 2019-09-09 DIAGNOSIS — I4819 Other persistent atrial fibrillation: Secondary | ICD-10-CM

## 2019-09-09 DIAGNOSIS — G4733 Obstructive sleep apnea (adult) (pediatric): Secondary | ICD-10-CM

## 2019-09-09 DIAGNOSIS — L8990 Pressure ulcer of unspecified site, unspecified stage: Secondary | ICD-10-CM | POA: Insufficient documentation

## 2019-09-09 DIAGNOSIS — J9601 Acute respiratory failure with hypoxia: Principal | ICD-10-CM

## 2019-09-09 DIAGNOSIS — I9589 Other hypotension: Secondary | ICD-10-CM

## 2019-09-09 DIAGNOSIS — E875 Hyperkalemia: Secondary | ICD-10-CM

## 2019-09-09 DIAGNOSIS — I214 Non-ST elevation (NSTEMI) myocardial infarction: Secondary | ICD-10-CM

## 2019-09-09 DIAGNOSIS — N179 Acute kidney failure, unspecified: Secondary | ICD-10-CM

## 2019-09-09 DIAGNOSIS — N170 Acute kidney failure with tubular necrosis: Secondary | ICD-10-CM

## 2019-09-09 LAB — BLOOD GAS, ARTERIAL
Acid-base deficit: 8.3 mmol/L — ABNORMAL HIGH (ref 0.0–2.0)
Acid-base deficit: 9.7 mmol/L — ABNORMAL HIGH (ref 0.0–2.0)
Bicarbonate: 19.9 mmol/L — ABNORMAL LOW (ref 20.0–28.0)
Bicarbonate: 18.3 mmol/L — ABNORMAL LOW (ref 20.0–28.0)
Delivery systems: POSITIVE
Delivery systems: POSITIVE
Expiratory PAP: 5
Expiratory PAP: 8
FIO2: 40
FIO2: 40
Inspiratory PAP: 10
Inspiratory PAP: 16
O2 Saturation: 95.8 %
O2 Saturation: 93.1 %
Patient temperature: 37
Patient temperature: 37
pCO2 arterial: 52 mmHg — ABNORMAL HIGH (ref 32.0–48.0)
pCO2 arterial: 48 mmHg (ref 32.0–48.0)
pH, Arterial: 7.19 — CL (ref 7.350–7.450)
pH, Arterial: 7.19 — CL (ref 7.350–7.450)
pO2, Arterial: 98 mmHg (ref 83.0–108.0)
pO2, Arterial: 83 mmHg (ref 83.0–108.0)

## 2019-09-09 LAB — BLOOD CULTURE ID PANEL (REFLEXED) - BCID2

## 2019-09-09 LAB — MAGNESIUM
Magnesium: 2.2 mg/dL (ref 1.7–2.4)
Magnesium: 2.2 mg/dL (ref 1.7–2.4)

## 2019-09-09 LAB — TROPONIN I (HIGH SENSITIVITY)
Troponin I (High Sensitivity): 1541 ng/L (ref <18)
Troponin I (High Sensitivity): 2333 ng/L (ref <18)

## 2019-09-09 LAB — COMPREHENSIVE METABOLIC PANEL
ALT: 19 U/L (ref 0–44)
AST: 48 U/L — ABNORMAL HIGH (ref 15–41)
Albumin: 2.7 g/dL — ABNORMAL LOW (ref 3.5–5.0)
Alkaline Phosphatase: 88 U/L (ref 38–126)
Anion gap: 8 (ref 5–15)
BUN: 94 mg/dL — ABNORMAL HIGH (ref 8–23)
CO2: 22 mmol/L (ref 22–32)
Calcium: 7.7 mg/dL — ABNORMAL LOW (ref 8.9–10.3)
Chloride: 109 mmol/L (ref 98–111)
Creatinine, Ser: 4.49 mg/dL — ABNORMAL HIGH (ref 0.61–1.24)
GFR calc Af Amer: 13 mL/min — ABNORMAL LOW (ref >60)
GFR calc non Af Amer: 12 mL/min — ABNORMAL LOW (ref >60)
Glucose, Bld: 86 mg/dL (ref 70–99)
Potassium: 5.3 mmol/L — ABNORMAL HIGH (ref 3.5–5.1)
Sodium: 139 mmol/L (ref 135–145)
Total Bilirubin: 1.3 mg/dL — ABNORMAL HIGH (ref 0.3–1.2)
Total Protein: 7.2 g/dL (ref 6.5–8.1)

## 2019-09-09 LAB — CBC
HCT: 36.3 % — ABNORMAL LOW (ref 39.0–52.0)
Hemoglobin: 10.7 g/dL — ABNORMAL LOW (ref 13.0–17.0)
MCH: 30.7 pg (ref 26.0–34.0)
MCHC: 29.5 g/dL — ABNORMAL LOW (ref 30.0–36.0)
MCV: 104.3 fL — ABNORMAL HIGH (ref 80.0–100.0)
Platelets: 186 10*3/uL (ref 150–400)
RBC: 3.48 MIL/uL — ABNORMAL LOW (ref 4.22–5.81)
RDW: 17.8 % — ABNORMAL HIGH (ref 11.5–15.5)
WBC: 13 10*3/uL — ABNORMAL HIGH (ref 4.0–10.5)
nRBC: 0.4 % — ABNORMAL HIGH (ref 0.0–0.2)

## 2019-09-09 LAB — FIBRIN DERIVATIVES D-DIMER (ARMC ONLY): Fibrin derivatives D-dimer (ARMC): 3926.98 ng/mL (FEU) — ABNORMAL HIGH (ref 0.00–499.00)

## 2019-09-09 LAB — BASIC METABOLIC PANEL
Anion gap: 9 (ref 5–15)
BUN: 95 mg/dL — ABNORMAL HIGH (ref 8–23)
CO2: 20 mmol/L — ABNORMAL LOW (ref 22–32)
Calcium: 8.2 mg/dL — ABNORMAL LOW (ref 8.9–10.3)
Chloride: 112 mmol/L — ABNORMAL HIGH (ref 98–111)
Creatinine, Ser: 4.44 mg/dL — ABNORMAL HIGH (ref 0.61–1.24)
GFR calc Af Amer: 14 mL/min — ABNORMAL LOW (ref >60)
GFR calc non Af Amer: 12 mL/min — ABNORMAL LOW (ref >60)
Glucose, Bld: 115 mg/dL — ABNORMAL HIGH (ref 70–99)
Potassium: 6 mmol/L — ABNORMAL HIGH (ref 3.5–5.1)
Sodium: 141 mmol/L (ref 135–145)

## 2019-09-09 LAB — HEMOGLOBIN A1C
Hgb A1c MFr Bld: 6.6 % — ABNORMAL HIGH (ref 4.8–5.6)
Mean Plasma Glucose: 142.72 mg/dL

## 2019-09-09 LAB — GLUCOSE, CAPILLARY
Glucose-Capillary: 109 mg/dL — ABNORMAL HIGH (ref 70–99)
Glucose-Capillary: 110 mg/dL — ABNORMAL HIGH (ref 70–99)
Glucose-Capillary: 76 mg/dL (ref 70–99)
Glucose-Capillary: 82 mg/dL (ref 70–99)
Glucose-Capillary: 83 mg/dL (ref 70–99)
Glucose-Capillary: 86 mg/dL (ref 70–99)
Glucose-Capillary: 99 mg/dL (ref 70–99)

## 2019-09-09 LAB — ECHOCARDIOGRAM COMPLETE
Height: 68 in
S' Lateral: 2.96 cm
Weight: 5280 oz

## 2019-09-09 LAB — MRSA PCR SCREENING: MRSA by PCR: NEGATIVE

## 2019-09-09 LAB — HEPARIN LEVEL (UNFRACTIONATED)
Heparin Unfractionated: 0.37 IU/mL (ref 0.30–0.70)
Heparin Unfractionated: 0.51 IU/mL (ref 0.30–0.70)

## 2019-09-09 LAB — LACTIC ACID, PLASMA: Lactic Acid, Venous: 1.2 mmol/L (ref 0.5–1.9)

## 2019-09-09 LAB — PHOSPHORUS
Phosphorus: 7.9 mg/dL — ABNORMAL HIGH (ref 2.5–4.6)
Phosphorus: 7.6 mg/dL — ABNORMAL HIGH (ref 2.5–4.6)

## 2019-09-09 LAB — POTASSIUM: Potassium: 5.7 mmol/L — ABNORMAL HIGH (ref 3.5–5.1)

## 2019-09-09 LAB — TSH: TSH: 1.653 u[IU]/mL (ref 0.350–4.500)

## 2019-09-09 LAB — VITAMIN B12: Vitamin B-12: 364 pg/mL (ref 180–914)

## 2019-09-09 LAB — PROCALCITONIN: Procalcitonin: 0.16 ng/mL

## 2019-09-09 LAB — CK: Total CK: 2443 U/L — ABNORMAL HIGH (ref 49–397)

## 2019-09-09 MED ORDER — FENTANYL CITRATE (PF) 100 MCG/2ML IJ SOLN
INTRAMUSCULAR | Status: AC
  Administered 2019-09-09: 50 ug via INTRAVENOUS
  Filled 2019-09-09: qty 2

## 2019-09-09 MED ORDER — NOREPINEPHRINE 16 MG/250ML-% IV SOLN
2.0000 ug/min | INTRAVENOUS | Status: DC
  Filled 2019-09-09: qty 250

## 2019-09-09 MED ORDER — DEXTROSE IN LACTATED RINGERS 5 % IV SOLN: INTRAVENOUS | Status: DC

## 2019-09-09 MED ORDER — LACTATED RINGERS IV SOLN: INTRAVENOUS | Status: DC

## 2019-09-09 MED ORDER — SODIUM CHLORIDE 0.9 % IV SOLN
2.0000 g | INTRAVENOUS | Status: DC
  Administered 2019-09-09 – 2019-09-11 (×3): 2 g via INTRAVENOUS
  Filled 2019-09-09: qty 2
  Filled 2019-09-09: qty 20
  Filled 2019-09-09 (×2): qty 2

## 2019-09-09 MED ORDER — INSULIN ASPART 100 UNIT/ML ~~LOC~~ SOLN
0.0000 [IU] | SUBCUTANEOUS | Status: DC
  Administered 2019-09-10 – 2019-09-11 (×8): 2 [IU] via SUBCUTANEOUS
  Administered 2019-09-11 – 2019-09-12 (×3): 3 [IU] via SUBCUTANEOUS
  Administered 2019-09-12 (×2): 2 [IU] via SUBCUTANEOUS
  Administered 2019-09-12: 3 [IU] via SUBCUTANEOUS
  Administered 2019-09-12 – 2019-09-15 (×13): 2 [IU] via SUBCUTANEOUS
  Administered 2019-09-16 (×2): 3 [IU] via SUBCUTANEOUS
  Administered 2019-09-16 (×4): 2 [IU] via SUBCUTANEOUS
  Administered 2019-09-17 (×3): 3 [IU] via SUBCUTANEOUS
  Administered 2019-09-17: 2 [IU] via SUBCUTANEOUS
  Administered 2019-09-17 – 2019-09-18 (×3): 3 [IU] via SUBCUTANEOUS
  Administered 2019-09-18: 2 [IU] via SUBCUTANEOUS
  Administered 2019-09-18 – 2019-09-19 (×4): 3 [IU] via SUBCUTANEOUS
  Administered 2019-09-19: 5 [IU] via SUBCUTANEOUS
  Administered 2019-09-19 (×4): 3 [IU] via SUBCUTANEOUS
  Administered 2019-09-20: 2 [IU] via SUBCUTANEOUS
  Administered 2019-09-20: 3 [IU] via SUBCUTANEOUS
  Administered 2019-09-20: 2 [IU] via SUBCUTANEOUS
  Administered 2019-09-20: 5 [IU] via SUBCUTANEOUS
  Administered 2019-09-21: 3 [IU] via SUBCUTANEOUS
  Administered 2019-09-21: 2 [IU] via SUBCUTANEOUS
  Administered 2019-09-21: 5 [IU] via SUBCUTANEOUS
  Administered 2019-09-21 (×3): 3 [IU] via SUBCUTANEOUS
  Administered 2019-09-22: 2 [IU] via SUBCUTANEOUS
  Filled 2019-09-09 (×61): qty 1

## 2019-09-09 MED ORDER — LACTATED RINGERS IV BOLUS
250.0000 mL | INTRAVENOUS | Status: AC
  Administered 2019-09-09 (×2): 250 mL via INTRAVENOUS

## 2019-09-09 MED ORDER — SODIUM CHLORIDE 0.9 % FOR CRRT
INTRAVENOUS_CENTRAL | Status: DC | PRN
  Filled 2019-09-09: qty 1000

## 2019-09-09 MED ORDER — HEPARIN SODIUM (PORCINE) 1000 UNIT/ML IJ SOLN
1000.0000 [IU] | INTRAMUSCULAR | Status: DC | PRN
  Filled 2019-09-09: qty 6
  Filled 2019-09-09: qty 10

## 2019-09-09 MED ORDER — PANTOPRAZOLE SODIUM 40 MG IV SOLR
40.0000 mg | INTRAVENOUS | Status: DC
  Administered 2019-09-10 – 2019-09-19 (×10): 40 mg via INTRAVENOUS
  Filled 2019-09-09 (×10): qty 40

## 2019-09-09 MED ORDER — INSULIN ASPART 100 UNIT/ML ~~LOC~~ SOLN: 0.0000 [IU] | SUBCUTANEOUS | Status: DC

## 2019-09-09 MED ORDER — CHLORHEXIDINE GLUCONATE CLOTH 2 % EX PADS
6.0000 | MEDICATED_PAD | Freq: Every day | CUTANEOUS | Status: DC
  Administered 2019-09-09 – 2019-09-10 (×2): 6 via TOPICAL

## 2019-09-09 MED ORDER — NOREPINEPHRINE 16 MG/250ML-% IV SOLN
0.0000 ug/min | INTRAVENOUS | Status: DC
  Administered 2019-09-09: 20 ug/min via INTRAVENOUS
  Administered 2019-09-09: 25 ug/min via INTRAVENOUS
  Administered 2019-09-09 (×2): 20 ug/min via INTRAVENOUS
  Administered 2019-09-10: 26 ug/min via INTRAVENOUS
  Administered 2019-09-10: 23 ug/min via INTRAVENOUS
  Administered 2019-09-11: 8 ug/min via INTRAVENOUS
  Administered 2019-09-12: 7 ug/min via INTRAVENOUS
  Filled 2019-09-09 (×7): qty 250

## 2019-09-09 MED ORDER — PRISMASOL BGK 0/2.5 32-2.5 MEQ/L IV SOLN
INTRAVENOUS | Status: DC
  Filled 2019-09-09 (×15): qty 5000

## 2019-09-09 MED ORDER — VASOPRESSIN 20 UNITS/100 ML INFUSION FOR SHOCK
0.0000 [IU]/min | INTRAVENOUS | Status: DC
  Administered 2019-09-09 – 2019-09-10 (×4): 0.03 [IU]/min via INTRAVENOUS
  Filled 2019-09-09 (×5): qty 100

## 2019-09-09 MED ORDER — PRISMASOL BGK 0/2.5 32-2.5 MEQ/L IV SOLN
INTRAVENOUS | Status: DC
  Filled 2019-09-09: qty 5000

## 2019-09-09 MED ORDER — FENTANYL CITRATE (PF) 100 MCG/2ML IJ SOLN: 50.0000 ug | Freq: Once | INTRAMUSCULAR | Status: AC

## 2019-09-09 MED ORDER — FLUCONAZOLE IN SODIUM CHLORIDE 400-0.9 MG/200ML-% IV SOLN
400.0000 mg | INTRAVENOUS | Status: DC
  Administered 2019-09-09 – 2019-09-11 (×3): 400 mg via INTRAVENOUS
  Filled 2019-09-09 (×4): qty 200

## 2019-09-09 MED ORDER — HEPARIN SODIUM (PORCINE) 1000 UNIT/ML DIALYSIS
1000.0000 [IU] | INTRAMUSCULAR | Status: DC | PRN
  Filled 2019-09-09: qty 6

## 2019-09-09 MED ORDER — IPRATROPIUM-ALBUTEROL 0.5-2.5 (3) MG/3ML IN SOLN
3.0000 mL | Freq: Four times a day (QID) | RESPIRATORY_TRACT | Status: DC
  Administered 2019-09-09 – 2019-09-12 (×14): 3 mL via RESPIRATORY_TRACT
  Filled 2019-09-09 (×14): qty 3

## 2019-09-09 NOTE — Progress Notes (Addendum)
Andrew Valentine  MRN: [id number]  DOB/AGE: 79-Jan-1942 79 y.o.  Primary Care Physician:[facility] [place] date: 09/08/2019  Chief Complaint:  Chief Complaint  Patient presents with  . Hypotension  . Hypoglycemia  . Altered Mental Status    S-Pt presented on  09/08/2019 with  Chief Complaint  Patient presents with  . Hypotension  . Hypoglycemia  . Altered Mental Status  .    Pt is obtunded , unable to offer any complaints . Patient son who happens to be traveling ICU RN video called from his location in [place]. I had extensive discussion with patient's son about patient's kidney related issues. As there was no recent data available in Care Everywhere/epic system I asked the patient's son let me know if they have any data on him. Patient's son after discussion with the family informed me about the family's request to continue to pursue aggressive treatment for now. Patient son also informed me about his creatinine available from 2020 which was around 1.5  Medications . atorvastatin  20 mg Oral Daily  . azithromycin  250 mg Oral Daily  . Chlorhexidine Gluconate Cloth  6 each Topical Daily  . insulin aspart  0-15 Units Subcutaneous Q4H  . ipratropium-albuterol  3 mL Nebulization Q6H  . levothyroxine  175 mcg Oral Daily  . pantoprazole (PROTONIX) IV  40 mg Intravenous Q24H  . senna  1 tablet Oral BID         ROS: unable to get any data  Physical Exam: Vital signs in last 24 hours: Temp:  [96.6 F (35.9 C)-97.6 F (36.4 C)] 97.6 F (36.4 C) (08/29 0400) Pulse Rate:  [33-91] 88 (08/29 1030) Resp:  [11-36] 17 (08/29 1030) BP: (67-162)/(31-151) 140/43 (08/29 1030) SpO2:  [74 %-100 %] 100 % (08/29 1030) FiO2 (%):  [40 %] 40 % (08/29 0300) Weight:  [149.7 kg] 149.7 kg (08/28 1206) Weight change:  Last BM Date: 09/09/19  Intake/Output from previous day: 08/28 0701 - 08/29 0700 In: 3090.9 [I.V.:1640.9; IV Piggyback:1450] Out: 445 [Urine:445] No  intake/output data recorded.   Physical Exam: General- pt is obtunded HEENT BIpap in situ  Resp- Moderate REsp distress, Rhonchi present CVS- S1S2 regular in rate and rhythm GIT- BS+, soft, NT, ND EXT- NO LE Edema, Cyanosis GU- Foley in situ  Access patient has temporary catheter in the left IJ  Lab Results: CBC Recent Labs    09/08/19 1205 09/09/19 0329  WBC 10.8* 13.0*  HGB 11.2* 10.7*  HCT 37.7* 36.3*  PLT 174 186    BMET Recent Labs    09/08/19 1205 09/09/19 0329  NA 140 139  K 5.3* 5.3*  CL 108 109  CO2 24 22  GLUCOSE 88 86  BUN 94* 94*  CREATININE 4.47* 4.49*  CALCIUM 8.0* 7.7*   Creatinine trend 2021 4.5 2020 1.5   Anion  Gap  140-131=9 Albumin  3.0  NO Delta AG Results for JAQWON, MANFRED (MRN [id number]) as of 09/09/2019 10:52  Ref. Range 09/08/2019 12:11 09/08/2019 12:53 09/09/2019 08:00  pH, Arterial Latest Ref Range: 7.35 - 7.45   7.22 (L) 7.19 (LL)  pCO2 arterial Latest Ref Range: 32 - 48 mmHg  54 (H) 52 (H)  pO2, Arterial Latest Ref Range: 83 - 108 mmHg  90 98  pH, Ven Latest Ref Range: 7.25 - 7.43  7.17 (LL)    pCO2, Ven Latest Ref Range: 44 - 60 mmHg 59       MICRO Recent Results (from the  past 240 hour(s))  MRSA PCR Screening     Status: None   Collection Time: 09/08/19 11:49 AM   Specimen: Nasopharyngeal  Result Value Ref Range Status   MRSA by PCR NEGATIVE NEGATIVE Final    Comment:        The GeneXpert MRSA Assay (FDA approved for NASAL specimens only), is one component of a comprehensive MRSA colonization surveillance program. It is not intended to diagnose MRSA infection nor to guide or monitor treatment for MRSA infections. Performed at [facility], [address] [place], [place] [address]   SARS Coronavirus 2 by RT PCR (hospital order, performed in [facility] hospital lab) Nasopharyngeal Nasopharyngeal Swab     Status: None   Collection Time: 09/08/19 12:05 PM   Specimen: Nasopharyngeal Swab  Result Value Ref  Range Status   SARS Coronavirus 2 NEGATIVE NEGATIVE Final    Comment: (NOTE) SARS-CoV-2 target nucleic acids are NOT DETECTED.  The SARS-CoV-2 RNA is generally detectable in upper and lower respiratory specimens during the acute phase of infection. The lowest concentration of SARS-CoV-2 viral copies this assay can detect is 250 copies / mL. A negative result does not preclude SARS-CoV-2 infection and should not be used as the sole basis for treatment or other patient management decisions.  A negative result may occur with improper specimen collection / handling, submission of specimen other than nasopharyngeal swab, presence of viral mutation(s) within the areas targeted by this assay, and inadequate number of viral copies (<250 copies / mL). A negative result must be combined with clinical observations, patient history, and epidemiological information.  Fact Sheet for Patients:   StrictlyIdeas.no  Fact Sheet for Healthcare Providers: BankingDealers.co.za  This test is not yet approved or  cleared by the [place] FDA and has been authorized for detection and/or diagnosis of SARS-CoV-2 by FDA under an Emergency Use Authorization (EUA).  This EUA will remain in effect (meaning this test can be used) for the duration of the COVID-19 declaration under Section 564(b)(1) of the Act, 21 U.S.C. section 360bbb-3(b)(1), unless the authorization is terminated or revoked sooner.  Performed at [facility], [address] [place], [place] [address]   Blood culture (routine x 2)     Status: None (Preliminary result)   Collection Time: 09/08/19 12:07 PM   Specimen: BLOOD  Result Value Ref Range Status   Specimen Description BLOOD L DISTAL FOREARM  Final   Special Requests   Final    BOTTLES DRAWN AEROBIC AND ANAEROBIC Blood Culture results may not be optimal due to an excessive volume of blood received in culture bottles    Culture  Setup Time   Final    Organism ID to follow [facility] CRITICAL RESULT CALLED TO, READ BACK BY AND VERIFIED WITH: Performed at [facility], [address], [place], [place] [address]    Culture GRAM POSITIVE COCCI  Final   Report Status PENDING  Incomplete  Blood culture (routine x 2)     Status: None (Preliminary result)   Collection Time: 09/08/19 12:59 PM   Specimen: BLOOD  Result Value Ref Range Status   Specimen Description BLOOD LEFT UPPER FORARM  Final   Special Requests   Final    BOTTLES DRAWN AEROBIC AND ANAEROBIC Blood Culture results may not be optimal due to an excessive volume of blood received in culture bottles   Culture   Final    NO GROWTH < 24 HOURS Performed at  [facility], [address], [place],  [address]    Report Status PENDING  Incomplete      Lab Results  Component Value Date   CALCIUM 7.7 (L) 09/09/2019   PHOS 7.9 (H) 09/09/2019          Results for EZELL, POKE (MRN [id number]) as of 09/09/2019 10:26  Ref. Range 09/08/2019 12:05  Hep A Ab, IgM Latest Ref Range: NON REACTIVE  NON REACTIVE  Hepatitis B Surface Ag Latest Ref Range: NON REACTIVE  NON REACTIVE  Hep B Core Ab, IgM Latest Ref Range: NON REACTIVE  NON REACTIVE  HCV Ab Latest Ref Range: NON REACTIVE  NON REACTIVE      Renal measurements: 10.2 x 4.8 x 5.2 cm = volume: 132 mL. Echogenicity within normal limits. No hydronephrosis. There is a cystic mass in the mid pole the right kidney measuring 1.2 cm.  Left Kidney:  Renal measurements: 9.1 x 4.8 x 4.3 cm = volume: 97 mL. Echogenicity within normal limits. No mass or hydronephrosis visualized.   FENA Results for SINAI, MAHANY (MRN [id number]) as of 09/09/2019 10:26  Ref. Range 09/08/2019 12:05  Sodium, Urine Latest Units: mmol/L 35  Creatinine, Urine Latest Units: mg/dL 205  Results for IMANOL, BIHL (MRN [id number]) as of 09/09/2019 10:26  Ref. Range 09/08/2019 12:05   Sodium Latest Ref Range: 135 - 145 mmol/L 140   Results for MIKAEL, SKODA (MRN [id number]) as of 09/09/2019 10:26  Ref. Range 09/08/2019 12:05  Creatinine Latest Ref Range: 0.61 - 1.24 mg/dL 4.47 (H)   FENA 0.05%  Impression:  1)Renal  AKI secondary to ATN Patient has AKI secondary to hypovolemia/hypotension/ACE/diuretics on board AKI on CKD CKD most likely secondary diabetes mellitus. 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 Range 09/08/2019 12:05  Iron Latest Ref Range: 45 - 182 ug/dL 32 (L)  UIBC Latest Units: ug/dL 258  TIBC Latest Ref Range: 250 - 450 ug/dL 290  Saturation Ratios Latest Ref Range: 17.9 - 39.5 % 11 (L)  Ferritin Latest Ref Range: 24 - 336 ng/mL 32  Folate Latest Ref Range: >5.9 ng/mL 15.6      4) secondary hyperparathyroidism CKD Mineral-Bone Disorder   secondary Hyperparathyroidism present Patient phosphorus is high CRRT should help   5) pneumonia Patient is on broad-spectrum antibiotics Primary MD is following  6) hyperkalemia Most likely secondary to AKI, CKD and is on board   CRRT should help  7)Acid base Patient has NO Anion gap ABG shows [place] low Patient ABG showed pCo2 high And chemistry bicarb normal  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 intake   8) troponin elevated Patient troponins  are rising  Results for STEVENSON, WINDMILLER (MRN [id number]) as of 09/09/2019 10:52  Ref. Range 09/08/2019 12:05 09/08/2019 16:07 09/09/2019 03:29 09/09/2019 06:07 09/09/2019 10:04  Troponin I (High Sensitivity) Latest Ref Range: <18 ng/L 181 (HH) 445 (  HH)  1,287 ([place]) 2,333 ([place])    Patient is on IV heparin   9) Acute respiratory  failures On Bipap Pulmonary team following closely for the need of intubation   10) AMS Primary team  Following     Plan:  Pt with rising troponin's, on vasopressors, Oliguric, acidotic -patient will not tolerate hemodialysis.  Pt will need CRRT. After discussion with the patient's family they do agree with the need for CRRT There is logistical issue of staffing.ICU team is working very diligently to bring in the trained staff under current pandemic peak/wave situations  Will use 3K bath and not 2K bath in an effort to avoid hypokalemia and rising troponin situation.  I spent more than 60 minutes on patient data collection/physical examination/decision making/care coordination/discussion with the ICU team/discussion with patient family   Jiya Kissinger s Theador Hawthorne 09/09/2019, 11:57 AM

## 2019-09-09 NOTE — Consult Note (Signed)
[place] NOTE   Pharmacy Consult for CRRT Med review  Allergies  Allergen Reactions   Penicillins Rash    Patient Measurements: Height: 5\' 8"  (172.7 cm) Weight: (!) 149.7 kg (330 lb) IBW/kg (Calculated) : 68.4  Vital Signs: Temp: 97 F (36.1 C) (08/29 1200) Temp Source: Axillary (08/29 1200) BP: 109/41 (08/29 1230) Pulse Rate: 80 (08/29 1230) Intake/Output from previous day: 08/28 0701 - 08/29 0700 In: 3090.9 [I.V.:1640.9; IV Piggyback:1450] Out: 445 [Urine:445] Intake/Output from this shift: No intake/output data recorded.  Labs: Recent Labs    09/08/19 1205 09/08/19 1206 09/09/19 0329  WBC 10.8*  --  13.0*  HGB 11.2*  --  10.7*  HCT 37.7*  --  36.3*  PLT 174  --  186  APTT  --  34  --   CREATININE 4.47*  --  4.49*  LABCREA 205  --   --   MG 2.6*  --  2.2  PHOS  --   --  7.9*  ALBUMIN 3.0*  --  2.7*  PROT 7.7  --  7.2  AST 25  --  48*  ALT 16  --  19  ALKPHOS 89  --  88  BILITOT 1.5*  --  1.3*   Estimated Creatinine Clearance: 19 mL/min (A) (by C-G formula based on SCr of 4.49 mg/dL (H)).    Medical History: Past Medical History:  Diagnosis Date   Congestive heart failure (CHF) ([place])    Hypertension    Hypothyroidism    Morbid obesity ([place])    Obesity hypoventilation syndrome (HCC)    Type 2 diabetes mellitus (HCC)     Medications:  Medications Prior to Admission  Medication Sig Dispense Refill Last Dose   atenolol (TENORMIN) 100 MG tablet Take 100 mg by mouth daily.      atorvastatin (LIPITOR) 40 MG tablet Take 40 mg by mouth daily.      glipiZIDE (GLUCOTROL) 5 MG tablet Take 5 mg by mouth daily.      hydrochlorothiazide (HYDRODIURIL) 25 MG tablet Take 25 mg by mouth daily.      levothyroxine (SYNTHROID) 175 MCG tablet Take 175 mcg by mouth daily.      lisinopril (ZESTRIL) 5 MG tablet Take 5 mg by mouth daily.      Scheduled:   azithromycin  250 mg Oral Daily   Chlorhexidine Gluconate Cloth  6 each Topical  Daily   insulin aspart  0-15 Units Subcutaneous Q4H   ipratropium-albuterol  3 mL Nebulization Q6H   levothyroxine  175 mcg Oral Daily   pantoprazole (PROTONIX) IV  40 mg Intravenous Q24H   senna  1 tablet Oral BID   Infusions:   cefTRIAXone (ROCEPHIN)  IV     dextrose 5% lactated ringers 75 mL/hr at 09/09/19 0640   fluconazole (DIFLUCAN) IV     heparin 1,400 Units/hr (09/09/19 0600)   lactated ringers 150 mL/hr at 09/09/19 1020   norepinephrine (LEVOPHED) Adult infusion 15 mcg/min (09/09/19 1059)   prismasol bgk replacement solution with potassium     prismasol bgk replacement solution with potassium     prismasol bgk dialysis solution with potassium     vasopressin 0.03 Units/min (09/09/19 1016)   PRN: acetaminophen **OR** acetaminophen, heparin, heparin sodium (porcine), ondansetron **OR** ondansetron (ZOFRAN) IV, polyethylene glycol, sodium chloride Anti-infectives (From admission, onward)   Start     Dose/Rate Route Frequency Ordered Stop   09/09/19 2200  fluconazole (DIFLUCAN) IVPB 400 mg  400 mg 100 mL/hr over 120 Minutes Intravenous Every 24 hours 09/09/19 1300 09/15/19 2159   09/09/19 1500  cefTRIAXone (ROCEPHIN) 2 g in sodium chloride 0.9 % 100 mL IVPB        2 g 200 mL/hr over 30 Minutes Intravenous Every 24 hours 09/09/19 1321     09/09/19 1000  azithromycin (ZITHROMAX) tablet 250 mg       "Followed by" Linked Group Details   250 mg Oral Daily 09/08/19 1437 09/13/19 0959   09/08/19 2151  fluconazole (DIFLUCAN) IVPB 200 mg  Status:  Discontinued        200 mg 100 mL/hr over 60 Minutes Intravenous Every 24 hours 09/08/19 1850 09/09/19 1300   09/08/19 1445  cefTRIAXone (ROCEPHIN) 1 g in sodium chloride 0.9 % 100 mL IVPB  Status:  Discontinued        1 g 200 mL/hr over 30 Minutes Intravenous Every 24 hours 09/08/19 1436 09/09/19 1321   09/08/19 1445  azithromycin (ZITHROMAX) tablet 500 mg       "Followed by" Linked Group Details   500 mg Oral Daily  09/08/19 1436 09/08/19 1548      Assessment: Pt will not tolerate HD per medical team. Starting CRRT. No med adjustments needed. Will continue to monitor daily.    [name], PharmD, BCPS 09/09/2019,1:23 PM

## 2019-09-09 NOTE — Progress Notes (Signed)
OT Cancellation Note  Patient Details Name: Andrew Valentine MRN: [id number] DOB: [birth year]   Cancelled Treatment:    Reason Eval/Treat Not Completed: Patient not medically ready. OT order received and chart reviewed - pt noted to have K+ critically high at 5.3; contraindicated for exertional activity at this time. Will continue to follow and initiate services as pt medically appropriate to participate in therapy.  Dessie Coma, M.S. OTR/L  09/09/19, 12:57 PM  ascom [phone]

## 2019-09-09 NOTE — Progress Notes (Signed)
Assisted tele visit to patient with family member.  Jaleisa Brose M, RN  

## 2019-09-09 NOTE — Consult Note (Signed)
[place] for heparin dosing  Indication: chest pain/ACS  Allergies  Allergen Reactions  . Penicillins Rash    Patient Measurements: Height: 5\' 8"  (172.7 cm) Weight: (!) 149.7 kg (330 lb) IBW/kg (Calculated) : 68.4 Heparin Dosing Weight: 104.8  Vital Signs: Temp: 97 F (36.1 C) (08/29 1200) Temp Source: Axillary (08/29 1200) BP: 109/41 (08/29 1230) Pulse Rate: 80 (08/29 1230)  Labs: Recent Labs    09/08/19 1205 09/08/19 1205 09/08/19 1206 09/08/19 1607 09/09/19 0042 09/09/19 0329 09/09/19 0607 09/09/19 1004 09/09/19 1218  HGB 11.2*  --   --   --   --  10.7*  --   --   --   HCT 37.7*  --   --   --   --  36.3*  --   --   --   PLT 174  --   --   --   --  186  --   --   --   APTT  --   --  34  --   --   --   --   --   --   LABPROT  --   --  14.9  --   --   --   --   --   --   INR  --   --  1.2  --   --   --   --   --   --   HEPARINUNFRC  --   --   --   --  0.51  --   --   --  0.37  CREATININE 4.47*  --   --   --   --  4.49*  --   --   --   CKTOTAL  --   --   --   --   --  2,443*  --   --   --   TROPONINIHS 181*   < >  --  445*  --   --  1,541* 2,333*  --    < > = values in this interval not displayed.    Estimated Creatinine Clearance: 19 mL/min (A) (by C-G formula based on SCr of 4.49 mg/dL (H)).   Medical History: Past Medical History:  Diagnosis Date  . Congestive heart failure (CHF) ([place])   . Hypertension   . Hypothyroidism   . Morbid obesity ([place])   . Obesity hypoventilation syndrome ([place])   . Type 2 diabetes mellitus (HCC)     Medications:  Scheduled:  . azithromycin  250 mg Oral Daily  . Chlorhexidine Gluconate Cloth  6 each Topical Daily  . insulin aspart  0-15 Units Subcutaneous Q4H  . ipratropium-albuterol  3 mL Nebulization Q6H  . levothyroxine  175 mcg Oral Daily  . pantoprazole (PROTONIX) IV  40 mg Intravenous Q24H  . senna  1 tablet Oral BID    Assessment: 78 year old male presenting to the ED  with AMS, hypoglycemia, and hypotension. Troponins 181, hgb 11.2, plt 174.  8/29 0042 HL 0.51  8/29 1218 HL 0.37   Goal of Therapy:  Heparin level 0.3-0.7 units/ml Monitor platelets by anticoagulation protocol: Yes   Plan:  Heparin level is therapeutic x2. Will continue current rate (1400 units/hr) and will recheck HL and CBC with AM labs.   Eleonore Chiquito, PharmD, BCPS Clinical Pharmacist 09/09/2019 1:08 PM

## 2019-09-09 NOTE — Progress Notes (Signed)
PT Cancellation Note  Patient Details Name: Andrew Valentine MRN: [id number] DOB: [birth year]   Cancelled Treatment:    Reason Eval/Treat Not Completed:Patient was transferred to the ICU and PT needs new orders to evaluate.    [address] [place], [place] DPT 09/09/2019, 11:05 AM

## 2019-09-09 NOTE — Progress Notes (Signed)
Levo moved over to  Central line following provider verification of central line placement.

## 2019-09-09 NOTE — Consult Note (Signed)
ANTICOAGULATION CONSULT NOTE - Initial Consult  Pharmacy Consult for heparin dosing  Indication: chest pain/ACS  Allergies  Allergen Reactions   Penicillins Rash    Patient Measurements: Height: 5\' 8"  (172.7 cm) Weight: (!) 149.7 kg (330 lb) IBW/kg (Calculated) : 68.4 Heparin Dosing Weight: 104.8  Vital Signs: Temp: 97 F (36.1 C) (08/29 0045) Temp Source: Axillary (08/29 0045) BP: 90/50 (08/29 0145) Pulse Rate: 75 (08/29 0145)  Labs: Recent Labs    09/08/19 1205 09/08/19 1206 09/08/19 1607 09/09/19 0042  HGB 11.2*  --   --   --   HCT 37.7*  --   --   --   PLT 174  --   --   --   APTT  --  34  --   --   LABPROT  --  14.9  --   --   INR  --  1.2  --   --   HEPARINUNFRC  --   --   --  0.51  CREATININE 4.47*  --   --   --   TROPONINIHS 181*  --  445*  --     Estimated Creatinine Clearance: 19.1 mL/min (A) (by C-G formula based on SCr of 4.47 mg/dL (H)).   Medical History: No past medical history on file.  Medications:  Scheduled:   atorvastatin  20 mg Oral Daily   azithromycin  250 mg Oral Daily   Chlorhexidine Gluconate Cloth  6 each Topical Daily   insulin aspart  0-15 Units Subcutaneous Q4H   levothyroxine  175 mcg Oral Daily   senna  1 tablet Oral BID    Assessment: 79 year old male presenting to the ED with AMS, hypoglycemia, and hypotension. Troponins 181, hgb 11.2, plt 174.  Goal of Therapy:  Heparin level 0.3-0.7 units/ml Monitor platelets by anticoagulation protocol: Yes   Plan:  08/29 @ 0100 HL 0.51 therapeutic. Will continue current rate and will recheck HL at 1000 and continue to monitor.   Tobie Lords, PharmD, BCPS Clinical Pharmacist 09/09/2019 1:55 AM

## 2019-09-09 NOTE — Consult Note (Signed)
PHARMACY - PHYSICIAN COMMUNICATION CRITICAL VALUE ALERT - BLOOD CULTURE IDENTIFICATION (BCID)  Andrew Valentine is an 79 y.o. male who presented to [facility] on 09/08/2019 with a chief complaint of AMS and hypoxia  Assessment:  1 out of 4 bottles positive for GPC. BCID was positive for staphylococcus epidermidis.    Name of physician (or Provider) Contacted: Dr. Lillard Anes  Current antibiotics: ceftriaxone and azithromycin + fluconazole.   Changes to prescribed antibiotics recommended:  No changes needed - provider agreed.   Results for orders placed or performed during the hospital encounter of 09/08/19  Blood Culture ID Panel (Reflexed) (Collected: 09/08/2019 12:07 PM)  Result Value Ref Range   Enterococcus faecalis NOT DETECTED NOT DETECTED   Enterococcus Faecium NOT DETECTED NOT DETECTED   Listeria monocytogenes NOT DETECTED NOT DETECTED   Staphylococcus species DETECTED (A) NOT DETECTED   Staphylococcus aureus (BCID) NOT DETECTED NOT DETECTED   Staphylococcus epidermidis DETECTED (A) NOT DETECTED   Staphylococcus lugdunensis NOT DETECTED NOT DETECTED   Streptococcus species NOT DETECTED NOT DETECTED   Streptococcus agalactiae NOT DETECTED NOT DETECTED   Streptococcus pneumoniae NOT DETECTED NOT DETECTED   Streptococcus pyogenes NOT DETECTED NOT DETECTED   A.calcoaceticus-baumannii NOT DETECTED NOT DETECTED   Bacteroides fragilis NOT DETECTED NOT DETECTED   Enterobacterales NOT DETECTED NOT DETECTED   Enterobacter cloacae complex NOT DETECTED NOT DETECTED   Escherichia coli NOT DETECTED NOT DETECTED   Klebsiella aerogenes NOT DETECTED NOT DETECTED   Klebsiella oxytoca NOT DETECTED NOT DETECTED   Klebsiella pneumoniae NOT DETECTED NOT DETECTED   Proteus species NOT DETECTED NOT DETECTED   Salmonella species NOT DETECTED NOT DETECTED   Serratia marcescens NOT DETECTED NOT DETECTED   Haemophilus influenzae NOT DETECTED NOT DETECTED   Neisseria meningitidis NOT DETECTED NOT DETECTED    Pseudomonas aeruginosa NOT DETECTED NOT DETECTED   Stenotrophomonas maltophilia NOT DETECTED NOT DETECTED   Candida albicans NOT DETECTED NOT DETECTED   Candida auris NOT DETECTED NOT DETECTED   Candida glabrata NOT DETECTED NOT DETECTED   Candida krusei NOT DETECTED NOT DETECTED   Candida parapsilosis NOT DETECTED NOT DETECTED   Candida tropicalis NOT DETECTED NOT DETECTED   Cryptococcus neoformans/gattii NOT DETECTED NOT DETECTED   Methicillin resistance mecA/C NOT DETECTED NOT DETECTED    [name] 09/09/2019  2:45 PM

## 2019-09-09 NOTE — Progress Notes (Signed)
Provider aware of blood pressure with Map below 65 after LR bolus and Levo at 10 mcg.  Provider aware Patient has only put out 164mls of dark brown urine.   Patient is Alert  And follows commands

## 2019-09-09 NOTE — Progress Notes (Signed)
PROGRESS NOTE    Andrew Valentine  WRU:[id number] DOB: 10-08-1940 DOA: 09/08/2019 PCP: [facility] [place]:   Active Problems:   Acute hypoxemic respiratory failure (HCC)   Acute renal failure (HCC)   Hyperkalemia   NSTEMI (non-ST elevated myocardial infarction) (HCC)   Pressure injury of skin   Acute metabolic encephalopathy: etiology unclear, possibly secondary to infection. Continue on IV abxs & IV anti-fungals. Re-orient prn   Acute hypoxic respiratory failure: likely secondary pneumonia versus CHF. Continue on IV rocephin & azithromycin. Holding lasix secondary to rising Cr. COVID19 neg.   A fib: unsure if it is chronic or PAF. Continue on tele. Continue on IV heparin drip. Cardio following and recs apprec  AKI vs CKD: baseline Cr/GFR is unknown. Will need CRRT as per nephro. Nephro following and recs apprec   Hypotension: continue on levophed, keep MAP > 65 and wean as tolerated.  Hyperkalemia: will managed w/ CRRT. CRRT as per nephro   Elevated troponins: significant, etiology unclear. S/p echo which shows pulmon HTN. Continue on tele. Cardio following and recs apprec  Severe candidiasis of abdominal skin folds and sacrum/buttock: continue on fluconazole   Leukocytosis: secondary to infection, bacterial & fungal   Macrocytic anemia: etiology unclear. 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 Wound care consulted   DM2: continue to hold home dose of glipizide. Continue on SSI w/ accuchecks   HTN: continue to hold home dose of atenolol, lisinopril secondary to hypotension   HLD: continue on statin    Hypothyroidism: continue on levothyroxine   DVT prophylaxis: heparin drip Code Status: full  Family Communication:  Disposition Plan: depends on PT/OT recs (not consulted yet)    Consultants:      Procedures:    Antimicrobials: azithromycin, rocephin    Subjective: Pt is obtunded and  unable to answer questions  Objective: Vitals:   09/09/19 0600 09/09/19 0615 09/09/19 0630 09/09/19 0728  BP: 116/74 123/76 (!) 115/48   Pulse: 77 72 72 64  Resp: (!) 23 11  17   Temp:      TempSrc:      SpO2: 97% 97% 99%   Weight:      Height:        Intake/Output Summary (Last 24 hours) at 09/09/2019 0810 Last data filed at 09/09/2019 0600 Gross per 24 hour  Intake 3090.92 ml  Output 445 ml  Net 2645.92 ml   Filed Weights   09/08/19 1206  Weight: (!) 149.7 kg    Examination:  General exam: obtunded. Morbidly obese  Respiratory system: diminished breath sounds b/l Cardiovascular system: S1 & S2 +. No rubs, gallops or clicks. B/l LE edema Gastrointestinal system: Abdomen is obese, soft and nontender. Hypoactive bowel sounds heard. Central nervous system: obtunded. Unable to complete neuro exam  Skin: fungal skin infection under abd folds Psychiatry: Judgement and insight appear abnormal.    Data Reviewed: I have personally reviewed following labs and imaging studies  CBC: Recent Labs  Lab 09/08/19 1205 09/09/19 0329  WBC 10.8* 13.0*  NEUTROABS 8.9*  --   HGB 11.2* 10.7*  HCT 37.7* 36.3*  MCV 104.1* 104.3*  PLT 174 914   Basic Metabolic Panel: Recent Labs  Lab 09/08/19 1205 09/09/19 0329  NA 140 139  K 5.3* 5.3*  CL 108 109  CO2 24 22  GLUCOSE 88 86  BUN 94* 94*  CREATININE 4.47* 4.49*  CALCIUM 8.0* 7.7*  MG  2.6* 2.2  PHOS  --  7.9*   GFR: Estimated Creatinine Clearance: 19 mL/min (A) (by C-G formula based on SCr of 4.49 mg/dL (H)). Liver Function Tests: Recent Labs  Lab 09/08/19 1205 09/09/19 0329  AST 25 48*  ALT 16 19  ALKPHOS 89 88  BILITOT 1.5* 1.3*  PROT 7.7 7.2  ALBUMIN 3.0* 2.7*   No results for input(s): LIPASE, AMYLASE in the last 168 hours. No results for input(s): AMMONIA in the last 168 hours. Coagulation Profile: Recent Labs  Lab 09/08/19 1206  INR 1.2   Cardiac Enzymes: Recent Labs  Lab 09/09/19 0329  CKTOTAL  2,443*   BNP (last 3 results) No results for input(s): PROBNP in the last 8760 hours. HbA1C: No results for input(s): HGBA1C in the last 72 hours. CBG: Recent Labs  Lab 09/08/19 1546 09/08/19 1848 09/09/19 0239 09/09/19 0600 09/09/19 0723  GLUCAP 86 86 86 76 82   Lipid Profile: No results for input(s): CHOL, HDL, LDLCALC, TRIG, CHOLHDL, LDLDIRECT in the last 72 hours. Thyroid Function Tests: Recent Labs    09/09/19 0329  TSH 1.653   Anemia Panel: Recent Labs    09/08/19 1205 09/08/19 1808  VITAMINB12  --  364  FOLATE 15.6  --   FERRITIN 32  --   TIBC 290  --   IRON 32*  --   RETICCTPCT 2.7  --    Sepsis Labs: Recent Labs  Lab 09/08/19 1205 09/08/19 1206 09/09/19 0630  PROCALCITON  --  <0.10  --   LATICACIDVEN 1.1  --  1.2    Recent Results (from the past 240 hour(s))  MRSA PCR Screening     Status: None   Collection Time: 09/08/19 11:49 AM   Specimen: Nasopharyngeal  Result Value Ref Range Status   MRSA by PCR NEGATIVE NEGATIVE Final    Comment:        The GeneXpert MRSA Assay (FDA approved for NASAL specimens only), is one component of a comprehensive MRSA colonization surveillance program. It is not intended to diagnose MRSA infection nor to guide or monitor treatment for MRSA infections. Performed at [facility], [address] [place], [place] [address]   SARS Coronavirus 2 by RT PCR (hospital order, performed in [facility] hospital lab) Nasopharyngeal Nasopharyngeal Swab     Status: None   Collection Time: 09/08/19 12:05 PM   Specimen: Nasopharyngeal Swab  Result Value Ref Range Status   SARS Coronavirus 2 NEGATIVE NEGATIVE Final    Comment: (NOTE) SARS-CoV-2 target nucleic acids are NOT DETECTED.  The SARS-CoV-2 RNA is generally detectable in upper and lower respiratory specimens during the acute phase of infection. The lowest concentration of SARS-CoV-2 viral copies this assay can detect is 250 copies / mL. A negative  result does not preclude SARS-CoV-2 infection and should not be used as the sole basis for treatment or other patient management decisions.  A negative result may occur with improper specimen collection / handling, submission of specimen other than nasopharyngeal swab, presence of viral mutation(s) within the areas targeted by this assay, and inadequate number of viral copies (<250 copies / mL). A negative result must be combined with clinical observations, patient history, and epidemiological information.  Fact Sheet for Patients:   StrictlyIdeas.no  Fact Sheet for Healthcare Providers: BankingDealers.co.za  This test is not yet approved or  cleared by the [place] FDA and has been authorized for detection and/or diagnosis of SARS-CoV-2 by FDA under an Emergency Use Authorization (EUA).  This EUA will remain in effect (meaning this test can be used) for the duration of the COVID-19 declaration under Section 564(b)(1) of the Act, 21 U.S.C. section 360bbb-3(b)(1), unless the authorization is terminated or revoked sooner.  Performed at [facility], [address] [place], [place] [address]   Blood culture (routine x 2)     Status: None (Preliminary result)   Collection Time: 09/08/19 12:07 PM   Specimen: BLOOD  Result Value Ref Range Status   Specimen Description BLOOD L DISTAL FOREARM  Final   Special Requests   Final    BOTTLES DRAWN AEROBIC AND ANAEROBIC Blood Culture results may not be optimal due to an excessive volume of blood received in culture bottles   Culture   Final    NO GROWTH < 24 HOURS Performed at [facility], [address], [place], [place] [address]    Report Status PENDING  Incomplete  Blood culture (routine x 2)     Status: None (Preliminary result)   Collection Time: 09/08/19 12:59 PM   Specimen: BLOOD  Result Value Ref Range Status   Specimen Description BLOOD LEFT UPPER FORARM   Final   Special Requests   Final    BOTTLES DRAWN AEROBIC AND ANAEROBIC Blood Culture results may not be optimal due to an excessive volume of blood received in culture bottles   Culture   Final    NO GROWTH < 24 HOURS Performed at [facility], [address] [place], [place] [address]    Report Status PENDING  Incomplete         Radiology Studies: US RENAL  Result Date: 09/08/2019 CLINICAL DATA:  ATN EXAM: RENAL / URINARY TRACT ULTRASOUND COMPLETE COMPARISON:  None. FINDINGS: Right Kidney: Renal measurements: 10.2 x 4.8 x 5.2 cm = volume: 132 mL. Echogenicity within normal limits. No hydronephrosis. There is a cystic mass in the mid pole the right kidney measuring 1.2 cm. Left Kidney: Renal measurements: 9.1 x 4.8 x 4.3 cm = volume: 97 mL. Echogenicity within normal limits. No mass or hydronephrosis visualized. Bladder: Not visualized. Other: Exam limited by patient body habitus and inability to change position. IMPRESSION: 1. Limited exam. No definite acute finding in the bilateral kidneys. 2.  Small cyst in the midpole of the right kidney. Electronically Signed   By: Audie Pinto M.D.   On: 09/08/2019 17:06   DG Chest Port 1 View  Result Date: 09/09/2019 CLINICAL DATA:  Central line placement, history of CHF EXAM: PORTABLE CHEST 1 VIEW COMPARISON:  09/08/2019 FINDINGS: Left IJ double-lumen central line tip overlies the SVC. No pneumothorax. There is similar lung aeration with patchy bilateral opacities. Probable small bilateral pleural effusions. Top-normal heart size. Possible enlargement of the main pulmonary artery. IMPRESSION: Interval left IJ line placement with tip overlying SVC. No pneumothorax. Lung aeration is not substantially changed. Electronically Signed   By: Macy Mis M.D.   On: 09/09/2019 08:07   DG Chest Portable 1 View  Result Date: 09/08/2019 CLINICAL DATA:  Oxygen desaturation, hypoglycemia, shortness of breath, history COPD, CHF EXAM: PORTABLE  CHEST 1 VIEW COMPARISON:  Portable exam 1224 hours without priors for comparison FINDINGS: Upper normal heart size. Mediastinal contours normal. Patchy infiltrates bilaterally greater on RIGHT, question pneumonia, asymmetric edema considered less likely. Probable small LEFT pleural effusion. No pneumothorax. IMPRESSION: BILATERAL pulmonary infiltrates favoring pneumonia. Probable small LEFT pleural effusion. Electronically Signed   By: Lavonia Dana M.D.   On: 09/08/2019 13:06  Scheduled Meds: . atorvastatin  20 mg Oral Daily  . azithromycin  250 mg Oral Daily  . Chlorhexidine Gluconate Cloth  6 each Topical Daily  . insulin aspart  0-15 Units Subcutaneous Q4H  . ipratropium-albuterol  3 mL Nebulization Q6H  . levothyroxine  175 mcg Oral Daily  . pantoprazole (PROTONIX) IV  40 mg Intravenous Q24H  . senna  1 tablet Oral BID   Continuous Infusions: . cefTRIAXone (ROCEPHIN)  IV Stopped (09/08/19 1708)  . dextrose 5% lactated ringers 75 mL/hr at 09/09/19 0640  . fluconazole (DIFLUCAN) IV Stopped (09/09/19 0319)  . heparin 1,400 Units/hr (09/09/19 0600)  . norepinephrine (LEVOPHED) Adult infusion       LOS: 1 day    Time spent: 33 mins    Wyvonnia Dusky, MD [facility] Pager [phone]  If 7PM-7AM, please contact night-coverage [email or web address] 09/09/2019, 8:10 AM

## 2019-09-09 NOTE — Progress Notes (Signed)
Verbal MAP goal per Dr. Lanney Gins: MAP greater than 60    Patient on Bipap at the beginning of this RN's shift. Patients daughter, Daneil Dan was updated via phone call with this RN & Dr. Lanney Gins. The patients son, Legrand Como used Elink to access video into the room. Legrand Como was updated by Dr. Theador Hawthorne who was at the bedside. Legrand Como agreeable to starting CRRT on his father. Potential start of CRRT this evening.  Pt has remained on Bipap for the duration of this RN's shift. Patient remains on Levophed, Heparin and Vasopressin. Pt remains lethargic and confused but is following commands.

## 2019-09-09 NOTE — Progress Notes (Signed)
0436 Provider instructed nurse to turn Patients PIV levo  up to 12 mcg  While attempting to place central line   0532 patient instructed nurse to turn patients PIV levo up to 58mcg  While attempting to place central line.   IV site is intact flushes and has blood return

## 2019-09-09 NOTE — Progress Notes (Signed)
*  PRELIMINARY RESULTS* Echocardiogram 2D Echocardiogram has been performed.  Sherrie Sport 09/09/2019, 8:59 AM

## 2019-09-09 NOTE — Procedures (Signed)
Hemodialysis Catheter Insertion Procedure Note  Andrew Valentine  [id number]  12-09-1940  Date:09/09/19  Time:6:41 AM   Provider Performing:Magddalene S Tukov-Yual   Procedure: Insertion of Non-tunneled Central Venous [phone]) with US guidance ([id number])   Indication(s) Medication administration, Difficult access and Hemodialysis  Consent Risks of the procedure as well as the alternatives and risks of each were explained to the patient and/or caregiver.  Consent for the procedure was obtained and is signed in the bedside chart  Anesthesia Topical only with 1% lidocaine   Timeout Verified patient identification, verified procedure, site/side was marked, verified correct patient position, special equipment/implants available, medications/allergies/relevant history reviewed, required imaging and test results available.  Sterile Technique Maximal sterile technique including full sterile barrier drape, hand hygiene, sterile gown, sterile gloves, mask, hair covering, sterile ultrasound probe cover (if used).  Procedure Description Area of catheter insertion was cleaned with chlorhexidine and draped in sterile fashion.  With real-time ultrasound guidance a HD catheter was placed into the left internal jugular vein. Nonpulsatile blood flow and easy flushing noted in all ports.  The catheter was sutured in place and sterile dressing applied.  Complications/Tolerance None; patient tolerated the procedure well. Chest X-ray is ordered to verify placement for internal jugular or subclavian cannulation.   Chest x-ray is not ordered for femoral cannulation.  EBL Minimal   Specimen(s) Morning labs Procedure performed under direct supervision of Dr.Kasa, supervising MD. Ultrasound utilized for realtime vessel cannulation  Andrew Valentine S. [facility] ANP-BC Pulmonary and Critical Care Medicine [facility] Pager [phone] or [phone]  NB: This document was prepared using Dragon voice  recognition software and may include unintentional dictation errors.

## 2019-09-09 NOTE — Progress Notes (Signed)
Patients Map 62. Patient is on 10 mcg of levo PIV. Two 250ML LR bolus ordered by provider to run over two hours.

## 2019-09-09 NOTE — Consult Note (Signed)
Cardiology Consultation:   Patient ID: Andrew Valentine MRN: [id number]; DOB: Jun 20, 1940  Admit date: 09/08/2019 Date of Consult: 09/09/2019  Primary Care Provider: [facility] Cardiologist: new to [facility] Physician requesting consult: Dr. Fritzi Mandes Reason for consult: Atrial fibrillation, respiratory distress   Patient Profile:   Andrew Valentine is a 79 y.o. male with a hx of morbid obesity, obstructive sleep apnea, obesity hypoventilation syndrome, noncompliant with CPAP , diabetes , presenting with hyperglycemia, hypoxia, hypotension, noted to be in atrial fibrillation in the emergency room  History of Present Illness:   Andrew Valentine is unable to provide history, most of the information below provided from the notes and his daughter EMS has been called to the patient's house 3-4 times over the past 3 days for shortness of breath, On each occasion patient refusing transport He was found altered on this admission , fell from the commode, could not get up, was very short winded and daughter called EMS EMS noted he was  hypoglycemia, hypoxia, hypotension as above, on route was given D50, placed on nonrebreather  In the emergency room lab work showing acute renal failure creatinine greater than 4, troponin up to 180 He was started on heparin infusion, IV fluids  Chest x-ray with bilateral pulmonary infiltrate favoring pneumonia  This morning on BiPAP, lethargic though arousable EKG confirming atrial fibrillation  Past Medical History:  Diagnosis Date  . Congestive heart failure (CHF) ([place])   . Hypertension   . Hypothyroidism   . Morbid obesity ([place])   . Obesity hypoventilation syndrome ([place])   . Type 2 diabetes mellitus ([place])     Past Surgical History:  Procedure Laterality Date  . none       Home Medications:  Prior to Admission medications   Medication Sig Start Date End Date Taking? Authorizing Provider  atenolol (TENORMIN) 100 MG tablet Take 100  mg by mouth daily. 08/16/19   [provider]  atorvastatin (LIPITOR) 40 MG tablet Take 40 mg by mouth daily. 08/16/19   [provider]  glipiZIDE (GLUCOTROL) 5 MG tablet Take 5 mg by mouth daily. 08/16/19   [provider]  hydrochlorothiazide (HYDRODIURIL) 25 MG tablet Take 25 mg by mouth daily. 08/16/19   [provider]  levothyroxine (SYNTHROID) 175 MCG tablet Take 175 mcg by mouth daily. 08/16/19   [provider]  lisinopril (ZESTRIL) 5 MG tablet Take 5 mg by mouth daily. 08/16/19   [provider]    Inpatient Medications: Scheduled Meds: . azithromycin  250 mg Oral Daily  . Chlorhexidine Gluconate Cloth  6 each Topical Daily  . insulin aspart  0-15 Units Subcutaneous Q4H  . ipratropium-albuterol  3 mL Nebulization Q6H  . levothyroxine  175 mcg Oral Daily  . pantoprazole (PROTONIX) IV  40 mg Intravenous Q24H  . senna  1 tablet Oral BID   Continuous Infusions: . cefTRIAXone (ROCEPHIN)  IV    . dextrose 5% lactated ringers 75 mL/hr at 09/09/19 0640  . fluconazole (DIFLUCAN) IV    . heparin 1,400 Units/hr (09/09/19 0600)  . lactated ringers 150 mL/hr at 09/09/19 1020  . norepinephrine (LEVOPHED) Adult infusion 15 mcg/min (09/09/19 1059)  . prismasol bgk replacement solution with potassium    . prismasol bgk replacement solution with potassium    . prismasol bgk dialysis solution with potassium    . vasopressin 0.03 Units/min (09/09/19 1016)   PRN Meds: acetaminophen **OR** acetaminophen, heparin, heparin sodium (porcine), ondansetron **OR** ondansetron (ZOFRAN) IV, polyethylene  glycol, sodium chloride  Allergies:    Allergies  Allergen Reactions  . Penicillins Rash    Social History:   Social History   Socioeconomic History  . Marital status: Widowed    Spouse name: Not on file  . Number of children: Not on file  . Years of education: Not on file  . Highest education level: Not on file  Occupational History  . Not on  file  Tobacco Use  . Smoking status: Former Research scientist (life sciences)  . Smokeless tobacco: Never Used  Substance and Sexual Activity  . Alcohol use: Not Currently  . Drug use: Never  . Sexual activity: Not Currently  Other Topics Concern  . Not on file  Social History Narrative  . Not on file   Social Determinants of Health   Financial Resource Strain:   . Difficulty of Paying Living Expenses: Not on file  Food Insecurity:   . Worried About Charity fundraiser in the Last Year: Not on file  . Ran Out of Food in the Last Year: Not on file  Transportation Needs:   . Lack of Transportation (Medical): Not on file  . Lack of Transportation (Non-Medical): Not on file  Physical Activity:   . Days of Exercise per Week: Not on file  . Minutes of Exercise per Session: Not on file  Stress:   . Feeling of Stress : Not on file  Social Connections:   . Frequency of Communication with Friends and Family: Not on file  . Frequency of Social Gatherings with Friends and Family: Not on file  . Attends Religious Services: Not on file  . Active Member of Clubs or Organizations: Not on file  . Attends Archivist Meetings: Not on file  . Marital Status: Not on file  Intimate Partner Violence:   . Fear of Current or Ex-Partner: Not on file  . Emotionally Abused: Not on file  . Physically Abused: Not on file  . Sexually Abused: Not on file    Family History:    Family History  Family history unknown: Yes     ROS:  Please see the history of present illness.  Review of Systems  Unable to perform ROS: Acuity of condition    Physical Exam/Data:   Vitals:   09/09/19 1145 09/09/19 1200 09/09/19 1215 09/09/19 1230  BP: (!) 107/33 116/62 99/74 (!) 109/41  Pulse: 81 73 79 80  Resp: 20 18 20 17   Temp:  (!) 97 F (36.1 C)    TempSrc:  Axillary    SpO2: 100% 98% 91% 98%  Weight:      Height:        Intake/Output Summary (Last 24 hours) at 09/09/2019 1338 Last data filed at 09/09/2019 0600 Gross  per 24 hour  Intake 3090.92 ml  Output 145 ml  Net 2945.92 ml   Last 3 Weights 09/08/2019  Weight (lbs) 330 lb  Weight (kg) 149.687 kg     Body mass index is 50.18 kg/m.  General: Obese, no distress, on BiPAP HEENT: normal Lymph: no adenopathy Neck: no JVD Endocrine:  No thryomegaly Vascular: No carotid bruits; FA pulses 2+ bilaterally without bruits  Cardiac: Irregularly irregular no murmur  Lungs: Coarse breath sounds bilaterally, Rales Abd: soft, nontender, no hepatomegaly  Ext: no edema Musculoskeletal:  No deformities, BUE and BLE strength normal and equal Skin: warm and dry  Neuro: Unable to test Psych: Minimally arousable  EKG:  The EKG was personally reviewed and demonstrates:  Atrial fibrillation   Telemetry:  Telemetry was personally reviewed and demonstrates: Atrial fibrillation, right bundle branch block, rate in the 70s  Relevant CV Studies: Echocardiogram with normal LV function, mildly dilated left atrium, moderately dilated right ventricle though difficult to visualize, moderate to severely elevated right heart pressures  Laboratory Data:  High Sensitivity Troponin:   Recent Labs  Lab 10-04-19 1205 10/04/2019 1607 09/09/19 0607 09/09/19 1004  TROPONINIHS 181* 445* 1,541* 2,333*     Chemistry Recent Labs  Lab 10/04/19 1205 09/09/19 0329  NA 140 139  K 5.3* 5.3*  CL 108 109  CO2 24 22  GLUCOSE 88 86  BUN 94* 94*  CREATININE 4.47* 4.49*  CALCIUM 8.0* 7.7*  GFRNONAA 12* 12*  GFRAA 14* 13*  ANIONGAP 8 8    Recent Labs  Lab Oct 04, 2019 1205 09/09/19 0329  PROT 7.7 7.2  ALBUMIN 3.0* 2.7*  AST 25 48*  ALT 16 19  ALKPHOS 89 88  BILITOT 1.5* 1.3*   Hematology Recent Labs  Lab 10-04-2019 1205 09/09/19 0329  WBC 10.8* 13.0*  RBC 3.62*  3.62* 3.48*  HGB 11.2* 10.7*  HCT 37.7* 36.3*  MCV 104.1* 104.3*  MCH 30.9 30.7  MCHC 29.7* 29.5*  RDW 17.7* 17.8*  PLT 174 186   BNP Recent Labs  Lab 10-04-19 1205  BNP 280.3*    DDimer No  results for input(s): DDIMER in the last 168 hours.   Radiology/Studies:  US RENAL  Result Date: 2019-10-04 CLINICAL DATA:  ATN EXAM: RENAL / URINARY TRACT ULTRASOUND COMPLETE COMPARISON:  None. FINDINGS: Right Kidney: Renal measurements: 10.2 x 4.8 x 5.2 cm = volume: 132 mL. Echogenicity within normal limits. No hydronephrosis. There is a cystic mass in the mid pole the right kidney measuring 1.2 cm. Left Kidney: Renal measurements: 9.1 x 4.8 x 4.3 cm = volume: 97 mL. Echogenicity within normal limits. No mass or hydronephrosis visualized. Bladder: Not visualized. Other: Exam limited by patient body habitus and inability to change position. IMPRESSION: 1. Limited exam. No definite acute finding in the bilateral kidneys. 2.  Small cyst in the midpole of the right kidney. Electronically Signed   By: Audie Pinto M.D.   On: October 04, 2019 17:06   DG Chest Port 1 View  Result Date: 09/09/2019 CLINICAL DATA:  Central line placement, history of CHF EXAM: PORTABLE CHEST 1 VIEW COMPARISON:  October 04, 2019 FINDINGS: Left IJ double-lumen central line tip overlies the SVC. No pneumothorax. There is similar lung aeration with patchy bilateral opacities. Probable small bilateral pleural effusions. Top-normal heart size. Possible enlargement of the main pulmonary artery. IMPRESSION: Interval left IJ line placement with tip overlying SVC. No pneumothorax. Lung aeration is not substantially changed. Electronically Signed   By: Macy Mis M.D.   On: 09/09/2019 08:07   DG Chest Portable 1 View  Result Date: 04-Oct-2019 CLINICAL DATA:  Oxygen desaturation, hypoglycemia, shortness of breath, history COPD, CHF EXAM: PORTABLE CHEST 1 VIEW COMPARISON:  Portable exam 1224 hours without priors for comparison FINDINGS: Upper normal heart size. Mediastinal contours normal. Patchy infiltrates bilaterally greater on RIGHT, question pneumonia, asymmetric edema considered less likely. Probable small LEFT pleural effusion. No  pneumothorax. IMPRESSION: BILATERAL pulmonary infiltrates favoring pneumonia. Probable small LEFT pleural effusion. Electronically Signed   By: Lavonia Dana M.D.   On: 10/04/19 13:06   {   Assessment and Plan:   Atrial fibrillation Timing of onset unclear, rate relatively well controlled Prior medical records unavailable though have been requested Given hypotension, oral medications  have been held including lisinopril, HCTZ, atenolol --- He is on heparin infusion, currently not taking oral medications --- No plan to restore normal sinus rhythm at this time, high risk of recurrent arrhythmia given body habitus, dilated left atrium, pulmonary hypertension.  We will continue to reassess  Acute respiratory distress Presenting with hypoxia, hypercarbia in the setting of creatinine of 4 D-dimer markedly elevated, Discussed with critical care service, need to rule out PE High risk of PE given sedentary state --He does have pulmonary hypertension noted on echocardiogram, RVSP at least greater than 55 Elevated right heart pressures could also be secondary to chronic longstanding poorly controlled sleep apnea and obesity hypoventilation, prior smoking history  Pulmonary hypertension Elevated D-dimer, it is worth attempting to rule out PE -Likely secondary to poor controlled sleep apnea, obstructive sleep apnea --We will need outpatient BiPAP  Anemia Close monitoring, consider transfusion Followed by nephrology,  -Iron studies, check guaiacs, Consider EPO  Renal failure, stage IV Creatinine greater than 4 Etiology unclear, nephrology following May need kidney ultrasound to rule out obstructive uropathy, hydronephrosis   Total encounter time more than 110 minutes  Greater than 50% was spent in counseling and coordination of care with the patient   For questions or updates, please contact [facility] Please consult [email or web address] for contact info under    Signed, Ida Rogue,  MD  09/09/2019 1:38 PM

## 2019-09-09 NOTE — Consult Note (Signed)
PULMONARY / CRITICAL CARE MEDICINE  Name: Andrew Valentine MRN: [id number] DOB: [birth year]    LOS: 1  Referring Provider: Dr Sidney Ace Reason for Referral:  Hypovolemic shock and acute renal failure Brief patient description: This is a 79 year old male who presented from home via EMS with severe hypovolemic shock, acute renal failure with decreased urine output and uremic encephalopathy  SIGNIFICANT EVENTS: September 08, 2019: Admitted with acute renal failure, acute hypoxic respiratory failure requiring BiPAP and hypovolemic shock 09/09/2019: Decreased urine output, dialysis catheter placed for possible hemodialysis   HPI: This is a 79 year old male with a medical history as indicated below who presented to the ED after being found on his chair by his daughter.  Of note, patient lives alone but his daughter checks on him frequently.  Per patient's daughter, patient reported that he was trying to get up from his recliner and fell backwards.  He was unable to get up.  It is unclear how long patient was in this position.  When his daughter arrived, he was hypoxic, hypoglycemic and confused.  EMS was called and when EMS arrived, his SPO2 was in the 94I, systolic blood pressure in the 70s and his blood sugar was 38 mg/dL.  He was given dextrose, placed on supplemental oxygen and transferred to the ED.  His ED work-up revealed a creatinine level of 4.47.  He does not have a baseline creatinine documented.  His daughters denies any known history of CKD.  His renal ultrasound was unremarkable and difficult to obtain due to body habitus.  His chest x-ray showed patchy bilateral infiltrates suggestive of pneumonia.  He was placed on BiPAP and started on broad-spectrum antibiotics and admitted to the hospitalist service.  Despite fluid resuscitation, patient remained in refractory hypovolemic shock hence PCCM was consulted.  Past Medical History:  Diagnosis Date   Congestive heart failure (CHF) ([place])    Hypertension     Hypothyroidism    Morbid obesity ([place])    Obesity hypoventilation syndrome ([place])    Type 2 diabetes mellitus ([place])    Past Surgical History:  Procedure Laterality Date   none     No current facility-administered medications on file prior to encounter.   Current Outpatient Medications on File Prior to Encounter  Medication Sig   atenolol (TENORMIN) 100 MG tablet Take 100 mg by mouth daily.   atorvastatin (LIPITOR) 40 MG tablet Take 40 mg by mouth daily.   glipiZIDE (GLUCOTROL) 5 MG tablet Take 5 mg by mouth daily.   hydrochlorothiazide (HYDRODIURIL) 25 MG tablet Take 25 mg by mouth daily.   levothyroxine (SYNTHROID) 175 MCG tablet Take 175 mcg by mouth daily.   lisinopril (ZESTRIL) 5 MG tablet Take 5 mg by mouth daily.    Allergies Allergies  Allergen Reactions   Penicillins Rash    Family History Family History  Family history unknown: Yes   Social History  reports that he has quit smoking. He has never used smokeless tobacco. He reports previous alcohol use. He reports that he does not use drugs.  Review Of Systems: Unable to obtain an accurate review of systems as patient is on BiPAP and confused  VITAL SIGNS: BP 116/74    Pulse 77    Temp (!) 97 F (36.1 C) (Axillary)    Resp (!) 23    Ht 5\' 8"  (1.727 m)    Wt (!) 149.7 kg    SpO2 97%    BMI 50.18 kg/m   HEMODYNAMICS:    VENTILATOR SETTINGS:  INTAKE / OUTPUT: I/O last 3 completed shifts: In: 1100 [IV Piggyback:1100] Out: 300 [Urine:300]  PHYSICAL EXAMINATION: General: In moderate respiratory distress, awake and confused HEENT: Normocephalic and atraumatic, PERRLA, trachea midline, neck is short, mild JVD Neuro: Alert to person and place, follows basic commands, moves all extremities Cardiovascular: Apical pulse is regular, S1-S2, no murmur regurg or gallop, +2 pulses bilaterally Lungs: Increased work of breathing, diffuse rhonchi in anterior lung fields, no wheezes  Abdomen: Profound  central obesity, positive bowel sounds in all 4 quadrants, palpation reveals no organomegaly, anasarca involving the lower abdominal folds Musculoskeletal: Positive range of motion, no joint deformities Extremities: Venous stasis discoloration in bilateral lower extremity, +2 pulses, +2 nonpitting edema with multiple small excoriated skin areas Psyche: Confused, cooperative and follows commands Skin: Extensive bruising in bilateral lower and upper extremities, minor skin tears in upper and lower images, abdominal folds and perineal area extensively excoriated with small bleeding areas  LABS:  BMET Recent Labs  Lab 09/08/19 1205 09/09/19 0329  NA 140 139  K 5.3* 5.3*  CL 108 109  CO2 24 22  BUN 94* 94*  CREATININE 4.47* 4.49*  GLUCOSE 88 86    Electrolytes Recent Labs  Lab 09/08/19 1205 09/09/19 0329  CALCIUM 8.0* 7.7*  MG 2.6* 2.2  PHOS  --  7.9*    CBC Recent Labs  Lab 09/08/19 1205 09/09/19 0329  WBC 10.8* 13.0*  HGB 11.2* 10.7*  HCT 37.7* 36.3*  PLT 174 186    Coag's Recent Labs  Lab 09/08/19 1206  APTT 34  INR 1.2    Sepsis Markers Recent Labs  Lab 09/08/19 1205 09/08/19 1206  LATICACIDVEN 1.1  --   PROCALCITON  --  <0.10    ABG Recent Labs  Lab 09/08/19 1253  PHART 7.22*  PCO2ART 54*  PO2ART 90    Liver Enzymes Recent Labs  Lab 09/08/19 1205 09/09/19 0329  AST 25 48*  ALT 16 19  ALKPHOS 89 88  BILITOT 1.5* 1.3*  ALBUMIN 3.0* 2.7*    Cardiac Enzymes No results for input(s): TROPONINI, PROBNP in the last 168 hours.  Glucose Recent Labs  Lab 09/08/19 1155 09/08/19 1546 09/08/19 1848 09/09/19 0239 09/09/19 0600  GLUCAP 65* 86 86 86 76    Imaging US RENAL  Result Date: 09/08/2019 CLINICAL DATA:  ATN EXAM: RENAL / URINARY TRACT ULTRASOUND COMPLETE COMPARISON:  None. FINDINGS: Right Kidney: Renal measurements: 10.2 x 4.8 x 5.2 cm = volume: 132 mL. Echogenicity within normal limits. No hydronephrosis. There is a cystic  mass in the mid pole the right kidney measuring 1.2 cm. Left Kidney: Renal measurements: 9.1 x 4.8 x 4.3 cm = volume: 97 mL. Echogenicity within normal limits. No mass or hydronephrosis visualized. Bladder: Not visualized. Other: Exam limited by patient body habitus and inability to change position. IMPRESSION: 1. Limited exam. No definite acute finding in the bilateral kidneys. 2.  Small cyst in the midpole of the right kidney. Electronically Signed   By: Audie Pinto M.D.   On: 09/08/2019 17:06   DG Chest Portable 1 View  Result Date: 09/08/2019 CLINICAL DATA:  Oxygen desaturation, hypoglycemia, shortness of breath, history COPD, CHF EXAM: PORTABLE CHEST 1 VIEW COMPARISON:  Portable exam 1224 hours without priors for comparison FINDINGS: Upper normal heart size. Mediastinal contours normal. Patchy infiltrates bilaterally greater on RIGHT, question pneumonia, asymmetric edema considered less likely. Probable small LEFT pleural effusion. No pneumothorax. IMPRESSION: BILATERAL pulmonary infiltrates favoring pneumonia. Probable small LEFT pleural effusion.  Electronically Signed   By: Lavonia Dana M.D.   On: 09/08/2019 13:06   STUDIES:  2D echo  CULTURES: Blood cultures x2  ANTIBIOTICS: Ceftriaxone:  Azithromycin:  LINES/TUBES: Peripheral IVs Foley catheter Left IJ dialysis catheter  DISCUSSION: 79 year old male presenting with acute hypoxic respiratory failure secondary to obesity hypoventilation syndrome, hypovolemic shock and hypoglycemia  ASSESSMENT / PLAN:  PULMONARY A: Acute hypoxic respiratory failure requiring BiPAP Obesity hypoventilation syndrome Possible obstructive sleep apnea-patient never completed sleep study Community-acquired pneumonia P:   Continues BiPAP and titrate to nasal cannula as tolerated Mandatory nocturnal BiPAP Patient will require an outpatient sleep study DuoNeb every 6 hours Chest x-ray and ABG as needed  CARDIOVASCULAR A:  Hypovolemic  shock History of CHF-unclear if it is diastolic or systolic heart failure or combined History of hypertension and hyperlipidemia Elevated troponin P:  Gentle IV hydration and pressors to maintain mean arterial blood pressure greater than 65 Central venous catheter placed CVP monitoring every 4 hours Heparin infusion per hospitalist team Consulted cardiology, awaiting input  RENAL A:   Acute renal failure-current creatinine 4.4 Hyperkalemia Possible rhabdomyolysis given that patient was sitting in the same position for a long time after falling P:   Gentle IV hydration Nephrology consulted and following already Follow-up renal indicis Monitor and correct electrolytes Dialysis catheter placed Patient may require intermittent hemodialysis or CRRT  HEMATOLOGIC A:   Anemia of chronic disease P:  Trend hemoglobin and hematocrit Transfuse as needed Anemia panel unremarkable  INFECTIOUS A:   Community-acquired pneumonia P:   Continue broad-spectrum antibiotics Follow-up cultures  ENDOCRINE A:   Hypoglycemia due to poor oral intake Type 2 diabetes mellitus P:   We will monitor blood glucose closely Hold off on all insulin and oral hypoglycemics D5 LR at 75 cc/h  NEUROLOGIC A:   Acute uremic encephalopathy P:   Improving with IV fluids and BiPAP Monitor mental status closely Consider CT head for worsening encephalopathy  Best Practice: Code Status: Full code.  Will consult palliative care for goals of care conversations. Diet: N.p.o. while on BiPAP and carb modified and health healthy diet once off BiPAP GI prophylaxis: Protonix VTE prophylaxis: Subcu heparin   Plan of care discussed with [facility] physician  FAMILY  - Updates: Daughter updated on plan of care.  All questions answered  Aislin Onofre S. [facility] ANP-BC Pulmonary and Critical Care Medicine [facility] Pager [phone] or [phone]  NB: This document was prepared using Dragon voice  recognition software and may include unintentional dictation errors.    09/09/2019, 6:39 AM

## 2019-09-09 NOTE — Procedures (Signed)
Arterial Catheter Insertion Procedure Note  Andrew Valentine  [id number]  05/26/1940  Date:09/09/19  Time:6:45 AM    Provider Performing: Delight Hoh Tukov-Yual    Procedure: Insertion of Arterial Line [id number]) with US guidance ([id number])   Indication(s) Blood pressure monitoring and/or need for frequent ABGs  Consent Risks of the procedure as well as the alternatives and risks of each were explained to the patient and/or caregiver.  Consent for the procedure was obtained and is signed in the bedside chart  Anesthesia None   Time Out Verified patient identification, verified procedure, site/side was marked, verified correct patient position, special equipment/implants available, medications/allergies/relevant history reviewed, required imaging and test results available.   Sterile Technique Maximal sterile technique including full sterile barrier drape, hand hygiene, sterile gown, sterile gloves, mask, hair covering, sterile ultrasound probe cover (if used).   Procedure Description Area of catheter insertion was cleaned with chlorhexidine and draped in sterile fashion. With real-time ultrasound guidance an arterial catheter was attempted to be  placed into the left radial artery twice but patient kept moving his arm.  After two attempts, procedure was aborted.   Complications/Tolerance None; patient tolerated the procedure well.   EBL Minimal   Specimen(s) None Procedure performed under direct supervision of Dr.Kasa, supervising MD. Ultrasound utilized for realtime vessel cannulation  [name] S. [facility] ANP-BC Pulmonary and Critical Care Medicine [facility] Pager [phone] or [phone]  NB: This document was prepared using Dragon voice recognition software and may include unintentional dictation errors.

## 2019-09-09 NOTE — Progress Notes (Signed)
CRITICAL CARE PROGRESS NOTE    Name: Andrew Valentine MRN: [id number] DOB: 09-Nov-1940     LOS: 1   SUBJECTIVE FINDINGS & SIGNIFICANT EVENTS    Patient description:  30M hx of Morbid obesity BMI >50, CHF, DM, hypothoroidism, came from home due to dehydration and AKI.  He lives alone and cannot take care of himself. He was noted to be lethargic in ED with hypercapnia and hypoxemia.  I spoke to daughter she states she came to check on him and blood glucose was 47, he was unable to get out of chair. He was weak could not walk, desaturating 70s.  He was encephalopathic at this time. Daughter lives next door.   Lines/tubes : Urethral Catheter Maureen RN Straight-tip 16 Fr. (Active)  Indication for Insertion or Continuance of Catheter Unstable critically ill patients first 24-48 hours (See Criteria) 09/09/19 0729  Site Assessment Clean;Intact 09/09/19 0729  Catheter Maintenance Bag below level of bladder;Drainage bag/tubing not touching floor;No dependent loops;Seal intact 09/09/19 0729  Collection Container Standard drainage bag 09/09/19 0729  Securement Method Securing device (Describe) 09/09/19 0729  Urinary Catheter Interventions (if applicable) Unclamped 81/27/51 0729  Output (mL) 40 mL 09/09/19 0400    Microbiology/Sepsis markers: Results for orders placed or performed during the hospital encounter of 09/08/19  MRSA PCR Screening     Status: None   Collection Time: 09/08/19 11:49 AM   Specimen: Nasopharyngeal  Result Value Ref Range Status   MRSA by PCR NEGATIVE NEGATIVE Final    Comment:        The GeneXpert MRSA Assay (FDA approved for NASAL specimens only), is one component of a comprehensive MRSA colonization surveillance program. It is not intended to diagnose MRSA infection nor to guide or monitor  treatment for MRSA infections. Performed at [facility], [address] [place], [place] [address]   SARS Coronavirus 2 by RT PCR (hospital order, performed in [facility] hospital lab) Nasopharyngeal Nasopharyngeal Swab     Status: None   Collection Time: 09/08/19 12:05 PM   Specimen: Nasopharyngeal Swab  Result Value Ref Range Status   SARS Coronavirus 2 NEGATIVE NEGATIVE Final    Comment: (NOTE) SARS-CoV-2 target nucleic acids are NOT DETECTED.  The SARS-CoV-2 RNA is generally detectable in upper and lower respiratory specimens during the acute phase of infection. The lowest concentration of SARS-CoV-2 viral copies this assay can detect is 250 copies / mL. A negative result does not preclude SARS-CoV-2 infection and should not be used as the sole basis for treatment or other patient management decisions.  A negative result may occur with improper specimen collection / handling, submission of specimen other than nasopharyngeal swab, presence of viral mutation(s) within the areas targeted by this assay, and inadequate number of viral copies (<250 copies / mL). A negative result must be combined with clinical observations, patient history, and epidemiological information.  Fact Sheet for Patients:   StrictlyIdeas.no  Fact Sheet for Healthcare Providers: BankingDealers.co.za  This test is not yet approved or  cleared by the [place] FDA and has been authorized for detection and/or diagnosis of SARS-CoV-2 by FDA under an Emergency Use Authorization (EUA).  This EUA will remain in effect (meaning this test can be used) for the duration of the COVID-19 declaration under Section 564(b)(1) of the Act, 21 U.S.C. section 360bbb-3(b)(1), unless the authorization is terminated or revoked sooner.  Performed at [facility], [address] [place], [place] [address]   Blood culture (routine x 2)  Status: None  (Preliminary result)   Collection Time: 09/08/19 12:07 PM   Specimen: BLOOD  Result Value Ref Range Status   Specimen Description BLOOD L DISTAL FOREARM  Final   Special Requests   Final    BOTTLES DRAWN AEROBIC AND ANAEROBIC Blood Culture results may not be optimal due to an excessive volume of blood received in culture bottles   Culture   Final    NO GROWTH < 24 HOURS Performed at [facility], [address] [place], [place] [address]    Report Status PENDING  Incomplete  Blood culture (routine x 2)     Status: None (Preliminary result)   Collection Time: 09/08/19 12:59 PM   Specimen: BLOOD  Result Value Ref Range Status   Specimen Description BLOOD LEFT UPPER FORARM  Final   Special Requests   Final    BOTTLES DRAWN AEROBIC AND ANAEROBIC Blood Culture results may not be optimal due to an excessive volume of blood received in culture bottles   Culture   Final    NO GROWTH < 24 HOURS Performed at [facility], [address] [place], [place] [address]    Report Status PENDING  Incomplete    Anti-infectives:  Anti-infectives (From admission, onward)   Start     Dose/Rate Route Frequency Ordered Stop   09/09/19 1000  azithromycin (ZITHROMAX) tablet 250 mg       "Followed by" Linked Group Details   250 mg Oral Daily 09/08/19 1437 09/13/19 0959   09/08/19 2151  fluconazole (DIFLUCAN) IVPB 200 mg        200 mg 100 mL/hr over 60 Minutes Intravenous Every 24 hours 09/08/19 1850 09/15/19 2159   09/08/19 1445  cefTRIAXone (ROCEPHIN) 1 g in sodium chloride 0.9 % 100 mL IVPB        1 g 200 mL/hr over 30 Minutes Intravenous Every 24 hours 09/08/19 1436     09/08/19 1445  azithromycin (ZITHROMAX) tablet 500 mg       "Followed by" Linked Group Details   500 mg Oral Daily 09/08/19 1436 09/08/19 1548       Consults: Treatment Team:  Minna Merritts, MD Liana Gerold, MD     PAST MEDICAL HISTORY   Past Medical History:  Diagnosis Date  .  Congestive heart failure (CHF) ([place])   . Hypertension   . Hypothyroidism   . Morbid obesity ([place])   . Obesity hypoventilation syndrome ([place])   . Type 2 diabetes mellitus ([place])      SURGICAL HISTORY   Past Surgical History:  Procedure Laterality Date  . none       FAMILY HISTORY   Family History  Family history unknown: Yes     SOCIAL HISTORY   Social History   Tobacco Use  . Smoking status: Former Research scientist (life sciences)  . Smokeless tobacco: Never Used  Substance Use Topics  . Alcohol use: Not Currently  . Drug use: Never     MEDICATIONS   Current Medication:  Current Facility-Administered Medications:  .  acetaminophen (TYLENOL) tablet 650 mg, 650 mg, Oral, Q6H PRN **OR** acetaminophen (TYLENOL) suppository 650 mg, 650 mg, Rectal, Q6H PRN, Fritzi Mandes, MD .  atorvastatin (LIPITOR) tablet 20 mg, 20 mg, Oral, Daily, Fritzi Mandes, MD .  Margrett Rud azithromycin [facility]) tablet 500 mg, 500 mg, Oral, Daily, 500 mg at 09/08/19 1548 **FOLLOWED BY** azithromycin (ZITHROMAX) tablet 250 mg, 250 mg, Oral, Daily, Fritzi Mandes, MD .  cefTRIAXone (ROCEPHIN) 1 g in  sodium chloride 0.9 % 100 mL IVPB, 1 g, Intravenous, Q24H, Fritzi Mandes, MD, Stopped at 09/08/19 1708 .  Chlorhexidine Gluconate Cloth 2 % PADS 6 each, 6 each, Topical, Daily, Tukov-Yual, Magdalene S, NP, 6 each at 09/09/19 1000 .  dextrose 5 % in lactated ringers infusion, , Intravenous, Continuous, Tukov-Yual, Magdalene S, NP, Last Rate: 75 mL/hr at 09/09/19 0640, New Bag at 09/09/19 0640 .  fluconazole (DIFLUCAN) IVPB 200 mg, 200 mg, Intravenous, Q24H, Fritzi Mandes, MD, Stopped at 09/09/19 0319 .  heparin ADULT infusion 100 units/mL (25000 units/270mL sodium chloride 0.45%), 1,400 Units/hr, Intravenous, Continuous, Benn Moulder, RPH, Last Rate: 14 mL/hr at 09/09/19 0600, 1,400 Units/hr at 09/09/19 0600 .  heparin sodium (porcine) injection 1,000-6,000 Units, 1,000-6,000 Units, Intracatheter, PRN, Fritzi Mandes, MD .  insulin  aspart (novoLOG) injection 0-15 Units, 0-15 Units, Subcutaneous, Q4H, Tukov-Yual, Magdalene S, NP .  ipratropium-albuterol (DUONEB) 0.5-2.5 (3) MG/3ML nebulizer solution 3 mL, 3 mL, Nebulization, Q6H, Tukov-Yual, Magdalene S, NP, 3 mL at 09/09/19 0824 .  lactated ringers infusion, , Intravenous, Continuous, Anarely Nicholls, MD, Last Rate: 150 mL/hr at 09/09/19 1020, New Bag at 09/09/19 1020 .  levothyroxine (SYNTHROID) tablet 175 mcg, 175 mcg, Oral, Daily, Fritzi Mandes, MD, 175 mcg at 09/08/19 2036 .  norepinephrine (LEVOPHED) 16 mg in 25mL premix infusion, 0-40 mcg/min, Intravenous, Titrated, Jyl Chico, MD, Last Rate: 23.4 mL/hr at 09/09/19 0840, 25 mcg/min at 09/09/19 0840 .  ondansetron (ZOFRAN) tablet 4 mg, 4 mg, Oral, Q6H PRN **OR** ondansetron (ZOFRAN) injection 4 mg, 4 mg, Intravenous, Q6H PRN, Fritzi Mandes, MD .  pantoprazole (PROTONIX) injection 40 mg, 40 mg, Intravenous, Q24H, Tukov-Yual, Magdalene S, NP .  polyethylene glycol (MIRALAX / GLYCOLAX) packet 17 g, 17 g, Oral, Daily PRN, Fritzi Mandes, MD .  senna (SENOKOT) tablet 8.6 mg, 1 tablet, Oral, BID, Fritzi Mandes, MD .  vasopressin (PITRESSIN) 20 Units in sodium chloride 0.9 % 100 mL infusion-*FOR SHOCK*, 0-0.03 Units/min, Intravenous, Continuous, Lanney Gins, Canon Gola, MD, Last Rate: 9 mL/hr at 09/09/19 1016, 0.03 Units/min at 09/09/19 1016    ALLERGIES   Penicillins    REVIEW OF SYSTEMS    Unable to obtain due to lethargy  PHYSICAL EXAMINATION   Vital Signs: Temp:  [96.6 F (35.9 C)-97.6 F (36.4 C)] (P) 97.6 F (36.4 C) (08/29 0400) Pulse Rate:  [33-91] 81 (08/29 1000) Resp:  [11-36] 17 (08/29 1000) BP: (67-162)/(31-151) 129/45 (08/29 1000) SpO2:  [74 %-100 %] 100 % (08/29 1000) FiO2 (%):  [40 %] 40 % (08/29 0300) Weight:  [149.7 kg] 149.7 kg (08/28 1206)  GENERAL:Age appropriate on BIPAP lethargic HEAD: Normocephalic, atraumatic.  EYES: Pupils equal, round, reactive to light.  No scleral icterus.  MOUTH: Moist  mucosal membrane. NECK: Supple. No thyromegaly. No nodules. No JVD.  PULMONARY: decreased breath sounds bilatearlly  CARDIOVASCULAR: S1 and S2. Regular rate and rhythm. No murmurs, rubs, or gallops.  GASTROINTESTINAL: excoriated rash with skin breakdown of anterior lowe abdominal pannus.  MUSCULOSKELETAL: No swelling, clubbing, or edema.  NEUROLOGIC:  GCS6 SKIN:intact,warm,dry   PERTINENT DATA     Infusions: . cefTRIAXone (ROCEPHIN)  IV Stopped (09/08/19 1708)  . dextrose 5% lactated ringers 75 mL/hr at 09/09/19 0640  . fluconazole (DIFLUCAN) IV Stopped (09/09/19 0319)  . heparin 1,400 Units/hr (09/09/19 0600)  . lactated ringers 150 mL/hr at 09/09/19 1020  . norepinephrine (LEVOPHED) Adult infusion 25 mcg/min (09/09/19 0840)  . vasopressin 0.03 Units/min (09/09/19 1016)   Scheduled Medications: . atorvastatin  20 mg Oral  Daily  . azithromycin  250 mg Oral Daily  . Chlorhexidine Gluconate Cloth  6 each Topical Daily  . insulin aspart  0-15 Units Subcutaneous Q4H  . ipratropium-albuterol  3 mL Nebulization Q6H  . levothyroxine  175 mcg Oral Daily  . pantoprazole (PROTONIX) IV  40 mg Intravenous Q24H  . senna  1 tablet Oral BID   PRN Medications: acetaminophen **OR** acetaminophen, heparin sodium (porcine), ondansetron **OR** ondansetron (ZOFRAN) IV, polyethylene glycol Hemodynamic parameters:   Intake/Output: 08/28 0701 - 08/29 0700 In: 3090.9 [I.V.:1640.9; IV Piggyback:1450] Out: 445 [Urine:445]  Ventilator  Settings: FiO2 (%):  [40 %] 40 %   LAB RESULTS:  Basic Metabolic Panel: Recent Labs  Lab 09/08/19 1205 09/09/19 0329  NA 140 139  K 5.3* 5.3*  CL 108 109  CO2 24 22  GLUCOSE 88 86  BUN 94* 94*  CREATININE 4.47* 4.49*  CALCIUM 8.0* 7.7*  MG 2.6* 2.2  PHOS  --  7.9*   Liver Function Tests: Recent Labs  Lab 09/08/19 1205 09/09/19 0329  AST 25 48*  ALT 16 19  ALKPHOS 89 88  BILITOT 1.5* 1.3*  PROT 7.7 7.2  ALBUMIN 3.0* 2.7*   No results for  input(s): LIPASE, AMYLASE in the last 168 hours. No results for input(s): AMMONIA in the last 168 hours. CBC: Recent Labs  Lab 09/08/19 1205 09/09/19 0329  WBC 10.8* 13.0*  NEUTROABS 8.9*  --   HGB 11.2* 10.7*  HCT 37.7* 36.3*  MCV 104.1* 104.3*  PLT 174 186   Cardiac Enzymes: Recent Labs  Lab 09/09/19 0329  CKTOTAL 2,443*   BNP: Invalid input(s): POCBNP CBG: Recent Labs  Lab 09/08/19 1546 09/08/19 1848 09/09/19 0239 09/09/19 0600 09/09/19 0723  GLUCAP 86 86 86 76 82       IMAGING RESULTS:  Imaging: US RENAL  Result Date: 09/08/2019 CLINICAL DATA:  ATN EXAM: RENAL / URINARY TRACT ULTRASOUND COMPLETE COMPARISON:  None. FINDINGS: Right Kidney: Renal measurements: 10.2 x 4.8 x 5.2 cm = volume: 132 mL. Echogenicity within normal limits. No hydronephrosis. There is a cystic mass in the mid pole the right kidney measuring 1.2 cm. Left Kidney: Renal measurements: 9.1 x 4.8 x 4.3 cm = volume: 97 mL. Echogenicity within normal limits. No mass or hydronephrosis visualized. Bladder: Not visualized. Other: Exam limited by patient body habitus and inability to change position. IMPRESSION: 1. Limited exam. No definite acute finding in the bilateral kidneys. 2.  Small cyst in the midpole of the right kidney. Electronically Signed   By: Audie Pinto M.D.   On: 09/08/2019 17:06   DG Chest Port 1 View  Result Date: 09/09/2019 CLINICAL DATA:  Central line placement, history of CHF EXAM: PORTABLE CHEST 1 VIEW COMPARISON:  09/08/2019 FINDINGS: Left IJ double-lumen central line tip overlies the SVC. No pneumothorax. There is similar lung aeration with patchy bilateral opacities. Probable small bilateral pleural effusions. Top-normal heart size. Possible enlargement of the main pulmonary artery. IMPRESSION: Interval left IJ line placement with tip overlying SVC. No pneumothorax. Lung aeration is not substantially changed. Electronically Signed   By: Macy Mis M.D.   On: 09/09/2019  08:07   DG Chest Portable 1 View  Result Date: 09/08/2019 CLINICAL DATA:  Oxygen desaturation, hypoglycemia, shortness of breath, history COPD, CHF EXAM: PORTABLE CHEST 1 VIEW COMPARISON:  Portable exam 1224 hours without priors for comparison FINDINGS: Upper normal heart size. Mediastinal contours normal. Patchy infiltrates bilaterally greater on RIGHT, question pneumonia, asymmetric edema considered less likely.  Probable small LEFT pleural effusion. No pneumothorax. IMPRESSION: BILATERAL pulmonary infiltrates favoring pneumonia. Probable small LEFT pleural effusion. Electronically Signed   By: Lavonia Dana M.D.   On: 09/08/2019 13:06   @PROBHOSP @ US RENAL  Result Date: 09/08/2019 CLINICAL DATA:  ATN EXAM: RENAL / URINARY TRACT ULTRASOUND COMPLETE COMPARISON:  None. FINDINGS: Right Kidney: Renal measurements: 10.2 x 4.8 x 5.2 cm = volume: 132 mL. Echogenicity within normal limits. No hydronephrosis. There is a cystic mass in the mid pole the right kidney measuring 1.2 cm. Left Kidney: Renal measurements: 9.1 x 4.8 x 4.3 cm = volume: 97 mL. Echogenicity within normal limits. No mass or hydronephrosis visualized. Bladder: Not visualized. Other: Exam limited by patient body habitus and inability to change position. IMPRESSION: 1. Limited exam. No definite acute finding in the bilateral kidneys. 2.  Small cyst in the midpole of the right kidney. Electronically Signed   By: Audie Pinto M.D.   On: 09/08/2019 17:06   DG Chest Port 1 View  Result Date: 09/09/2019 CLINICAL DATA:  Central line placement, history of CHF EXAM: PORTABLE CHEST 1 VIEW COMPARISON:  09/08/2019 FINDINGS: Left IJ double-lumen central line tip overlies the SVC. No pneumothorax. There is similar lung aeration with patchy bilateral opacities. Probable small bilateral pleural effusions. Top-normal heart size. Possible enlargement of the main pulmonary artery. IMPRESSION: Interval left IJ line placement with tip overlying SVC. No  pneumothorax. Lung aeration is not substantially changed. Electronically Signed   By: Macy Mis M.D.   On: 09/09/2019 08:07   DG Chest Portable 1 View  Result Date: 09/08/2019 CLINICAL DATA:  Oxygen desaturation, hypoglycemia, shortness of breath, history COPD, CHF EXAM: PORTABLE CHEST 1 VIEW COMPARISON:  Portable exam 1224 hours without priors for comparison FINDINGS: Upper normal heart size. Mediastinal contours normal. Patchy infiltrates bilaterally greater on RIGHT, question pneumonia, asymmetric edema considered less likely. Probable small LEFT pleural effusion. No pneumothorax. IMPRESSION: BILATERAL pulmonary infiltrates favoring pneumonia. Probable small LEFT pleural effusion. Electronically Signed   By: Lavonia Dana M.D.   On: 09/08/2019 13:06      ASSESSMENT AND PLAN     Acute Hypoxic Respiratory Failure -negative COVID -8/29 -due to most likely OHS/chronic thoracic restriction with concomitant OSA -background of CHF- repeat TTE  -CXR reviewd independently by me- cardiomegaly, mild interstitial infiltrates with platelike atelectasis bilaterally.  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    - will order [place] LE and ddimer today   - patient empirically on heparin gtt  -plan for poss CTPE once renal impairment improved.   - may be due to AKI, will trend trop     Encephalopathy     -toxic metabolic vs  hypercapnic.     - correct electrolyte derrangement    -IVF rehydration      Excoriated rash of abdominal pannus -dressing per RN -wound care consultation  -empiric abx  ID -continue IV abx as prescibed -follow up cultures  GI/Nutrition GI PROPHYLAXIS as indicated DIET-->TF's as tolerated Constipation protocol as indicated  ENDO - ICU hypoglycemic\Hyperglycemia protocol -check FSBS per protocol   ELECTROLYTES -follow labs as needed -replace as needed -pharmacy consultation   DVT/GI PRX ordered -SCDs  TRANSFUSIONS AS NEEDED MONITOR FSBS ASSESS the need for LABS as needed   Critical care provider statement:    Critical care time (minutes):  109   Critical care time was exclusive of:  Separately billable procedures and treating other patients   Critical care was necessary to treat or prevent imminent or life-threatening deterioration of the following conditions:  Encephalopathy, morbid obesity, AKI, demand ischemia, mulpiple comorbid conditions   Critical care was time spent personally by me on the following activities:  Development of treatment plan with patient or surrogate, discussions with consultants, evaluation of patient's response to treatment, examination of patient, obtaining history from patient or surrogate, ordering and performing treatments and interventions, ordering and review of laboratory studies and re-evaluation of patient's condition.  I assumed direction of critical care for this patient from another provider in my specialty: no    This document was prepared using Dragon voice recognition software and may include unintentional dictation errors.    Ottie Glazier, M.D.  Division of [place]

## 2019-09-10 LAB — CBC
HCT: 34.1 % — ABNORMAL LOW (ref 39.0–52.0)
Hemoglobin: 10.3 g/dL — ABNORMAL LOW (ref 13.0–17.0)
MCH: 31.4 pg (ref 26.0–34.0)
MCHC: 30.2 g/dL (ref 30.0–36.0)
MCV: 104 fL — ABNORMAL HIGH (ref 80.0–100.0)
Platelets: 156 10*3/uL (ref 150–400)
RBC: 3.28 MIL/uL — ABNORMAL LOW (ref 4.22–5.81)
RDW: 17.7 % — ABNORMAL HIGH (ref 11.5–15.5)
WBC: 11.5 10*3/uL — ABNORMAL HIGH (ref 4.0–10.5)
nRBC: 0.2 % (ref 0.0–0.2)

## 2019-09-10 LAB — BLOOD GAS, ARTERIAL
Acid-base deficit: 5.8 mmol/L — ABNORMAL HIGH (ref 0.0–2.0)
Bicarbonate: 22.4 mmol/L (ref 20.0–28.0)
Delivery systems: POSITIVE
Expiratory PAP: 8
FIO2: 0.4
Inspiratory PAP: 16
O2 Saturation: 96.7 %
Patient temperature: 37
pCO2 arterial: 56 mmHg — ABNORMAL HIGH (ref 32.0–48.0)
pH, Arterial: 7.21 — ABNORMAL LOW (ref 7.350–7.450)
pO2, Arterial: 105 mmHg (ref 83.0–108.0)

## 2019-09-10 LAB — BASIC METABOLIC PANEL
Anion gap: 4 — ABNORMAL LOW (ref 5–15)
BUN: 82 mg/dL — ABNORMAL HIGH (ref 8–23)
CO2: 25 mmol/L (ref 22–32)
Calcium: 7.9 mg/dL — ABNORMAL LOW (ref 8.9–10.3)
Chloride: 110 mmol/L (ref 98–111)
Creatinine, Ser: 3.67 mg/dL — ABNORMAL HIGH (ref 0.61–1.24)
GFR calc Af Amer: 17 mL/min — ABNORMAL LOW (ref >60)
GFR calc non Af Amer: 15 mL/min — ABNORMAL LOW (ref >60)
Glucose, Bld: 136 mg/dL — ABNORMAL HIGH (ref 70–99)
Potassium: 5.2 mmol/L — ABNORMAL HIGH (ref 3.5–5.1)
Sodium: 139 mmol/L (ref 135–145)

## 2019-09-10 LAB — MAGNESIUM: Magnesium: 2.2 mg/dL (ref 1.7–2.4)

## 2019-09-10 LAB — PROCALCITONIN: Procalcitonin: 0.62 ng/mL

## 2019-09-10 LAB — GLUCOSE, CAPILLARY
Glucose-Capillary: 89 mg/dL (ref 70–99)
Glucose-Capillary: 117 mg/dL — ABNORMAL HIGH (ref 70–99)
Glucose-Capillary: 117 mg/dL — ABNORMAL HIGH (ref 70–99)
Glucose-Capillary: 128 mg/dL — ABNORMAL HIGH (ref 70–99)
Glucose-Capillary: 144 mg/dL — ABNORMAL HIGH (ref 70–99)
Glucose-Capillary: 139 mg/dL — ABNORMAL HIGH (ref 70–99)

## 2019-09-10 LAB — HEPARIN LEVEL (UNFRACTIONATED): Heparin Unfractionated: 0.35 IU/mL (ref 0.30–0.70)

## 2019-09-10 LAB — VITAMIN B12: Vitamin B-12: 325 pg/mL (ref 180–914)

## 2019-09-10 MED ORDER — HYDROCORTISONE NA SUCCINATE PF 100 MG IJ SOLR
100.0000 mg | Freq: Two times a day (BID) | INTRAMUSCULAR | Status: DC
  Administered 2019-09-10 – 2019-09-19 (×18): 100 mg via INTRAVENOUS
  Filled 2019-09-10 (×19): qty 2

## 2019-09-10 MED ORDER — PRISMASOL BGK 0/2.5 32-2.5 MEQ/L IV SOLN
INTRAVENOUS | Status: DC
  Filled 2019-09-10: qty 5000

## 2019-09-10 MED ORDER — ZINC OXIDE 12.8 % EX OINT
TOPICAL_OINTMENT | Freq: Every day | CUTANEOUS | Status: DC
  Filled 2019-09-10: qty 56.7

## 2019-09-10 MED ORDER — ZINC OXIDE 11.3 % EX CREA
1.0000 "application " | TOPICAL_CREAM | Freq: Every day | CUTANEOUS | Status: DC
  Administered 2019-09-10 – 2019-09-28 (×18): 1 via TOPICAL
  Filled 2019-09-10 (×2): qty 56

## 2019-09-10 MED ORDER — ORAL CARE MOUTH RINSE
15.0000 mL | Freq: Two times a day (BID) | OROMUCOSAL | Status: DC
  Administered 2019-09-10 – 2019-09-28 (×32): 15 mL via OROMUCOSAL

## 2019-09-10 MED ORDER — MIDODRINE HCL 5 MG PO TABS
10.0000 mg | ORAL_TABLET | Freq: Three times a day (TID) | ORAL | Status: DC
  Administered 2019-09-10 – 2019-09-28 (×41): 10 mg via ORAL
  Filled 2019-09-10 (×44): qty 2

## 2019-09-10 NOTE — Progress Notes (Signed)
[facility]  ROUNDING NOTE   Subjective:   On CRRT. Requiring vasopressin and norepinephrine.   Some confusion but able to answer some questions slowly.   LR infusion Tmax 100.2  K 5.2 (6)  Objective:  Vital signs in last 24 hours:  Temp:  [98.7 F (37.1 C)-100.2 F (37.9 C)] 98.7 F (37.1 C) (08/30 0800) Pulse Rate:  [65-99] 82 (08/30 0900) Resp:  [14-22] 19 (08/30 0900) BP: (65-209)/(23-184) 107/55 (08/30 0900) SpO2:  [91 %-100 %] 94 % (08/30 0900) FiO2 (%):  [40 %] 40 % (08/30 0000) Weight:  [161.4 kg-162.3 kg] 162.3 kg (08/30 0500)  Weight change: 11.7 kg Filed Weights   09/08/19 1206 09/09/19 2000 09/10/19 0500  Weight: (!) 149.7 kg (!) 161.4 kg (!) 162.3 kg    Intake/Output: I/O last 3 completed shifts: In: 8829.9 [I.V.:7666.9; IV HQPRFFMBW:4665] Out: 2321 [Urine:742; LDJTT:0177]   Intake/Output this shift:  Total I/O In: 215.1 [P.O.:20; I.V.:195.1] Out: 235 [Urine:35; Other:200]  Physical Exam: General: Critically ill  Head: Normocephalic, atraumatic. Moist oral mucosal membranes  Eyes: Anicteric, PERRL  Neck: Supple, trachea midline, obese  Lungs:  Diminished bilaterally 2L [place] O2  Heart: Regular rate and rhythm  Abdomen:  Soft, nontender, obese  Extremities: ++ peripheral edema.  Neurologic: Nonfocal, moving all four extremities, answering questions intermittently.   Skin: No lesions  Access: LIJ temp HD catheter 8/29 ICU    Basic Metabolic Panel: Recent Labs  Lab 09/08/19 1205 09/08/19 1205 09/09/19 0329 09/09/19 1004 09/09/19 1613 09/10/19 0409  NA 140  --  139  --  141 139  K 5.3*  --  5.3* 5.7* 6.0* 5.2*  CL 108  --  109  --  112* 110  CO2 24  --  22  --  20* 25  GLUCOSE 88  --  86  --  115* 136*  BUN 94*  --  94*  --  95* 82*  CREATININE 4.47*  --  4.49*  --  4.44* 3.67*  CALCIUM 8.0*   < > 7.7*  --  8.2* 7.9*  MG 2.6*  --  2.2  --  2.2 2.2  PHOS  --   --  7.9*  --  7.6*  --    < > = values in this interval not  displayed.    Liver Function Tests: Recent Labs  Lab 09/08/19 1205 09/09/19 0329  AST 25 48*  ALT 16 19  ALKPHOS 89 88  BILITOT 1.5* 1.3*  PROT 7.7 7.2  ALBUMIN 3.0* 2.7*   No results for input(s): LIPASE, AMYLASE in the last 168 hours. No results for input(s): AMMONIA in the last 168 hours.  CBC: Recent Labs  Lab 09/08/19 1205 09/09/19 0329 09/10/19 0409  WBC 10.8* 13.0* 11.5*  NEUTROABS 8.9*  --   --   HGB 11.2* 10.7* 10.3*  HCT 37.7* 36.3* 34.1*  MCV 104.1* 104.3* 104.0*  PLT 174 186 156    Cardiac Enzymes: Recent Labs  Lab 09/09/19 0329  CKTOTAL 2,443*    BNP: Invalid input(s): POCBNP  CBG: Recent Labs  Lab 09/09/19 1604 09/09/19 1913 09/09/19 2347 09/10/19 0404 09/10/19 0746  GLUCAP 99 110* 109* 117* 117*    Microbiology: Results for orders placed or performed during the hospital encounter of 09/08/19  MRSA PCR Screening     Status: None   Collection Time: 09/08/19 11:49 AM   Specimen: Nasopharyngeal  Result Value Ref Range Status   MRSA by PCR NEGATIVE NEGATIVE Final  Comment:        The GeneXpert MRSA Assay (FDA approved for NASAL specimens only), is one component of a comprehensive MRSA colonization surveillance program. It is not intended to diagnose MRSA infection nor to guide or monitor treatment for MRSA infections. Performed at [facility], [address] [place], [place] [address]   SARS Coronavirus 2 by RT PCR (hospital order, performed in [facility] hospital lab) Nasopharyngeal Nasopharyngeal Swab     Status: None   Collection Time: 09/08/19 12:05 PM   Specimen: Nasopharyngeal Swab  Result Value Ref Range Status   SARS Coronavirus 2 NEGATIVE NEGATIVE Final    Comment: (NOTE) SARS-CoV-2 target nucleic acids are NOT DETECTED.  The SARS-CoV-2 RNA is generally detectable in upper and lower respiratory specimens during the acute phase of infection. The lowest concentration of SARS-CoV-2 viral copies this assay  can detect is 250 copies / mL. A negative result does not preclude SARS-CoV-2 infection and should not be used as the sole basis for treatment or other patient management decisions.  A negative result may occur with improper specimen collection / handling, submission of specimen other than nasopharyngeal swab, presence of viral mutation(s) within the areas targeted by this assay, and inadequate number of viral copies (<250 copies / mL). A negative result must be combined with clinical observations, patient history, and epidemiological information.  Fact Sheet for Patients:   StrictlyIdeas.no  Fact Sheet for Healthcare Providers: BankingDealers.co.za  This test is not yet approved or  cleared by the [place] FDA and has been authorized for detection and/or diagnosis of SARS-CoV-2 by FDA under an Emergency Use Authorization (EUA).  This EUA will remain in effect (meaning this test can be used) for the duration of the COVID-19 declaration under Section 564(b)(1) of the Act, 21 U.S.C. section 360bbb-3(b)(1), unless the authorization is terminated or revoked sooner.  Performed at [facility], [address] [place], [place] [address]   Blood culture (routine x 2)     Status: None (Preliminary result)   Collection Time: 09/08/19 12:07 PM   Specimen: BLOOD  Result Value Ref Range Status   Specimen Description   Final    BLOOD L DISTAL FOREARM Performed at [facility], [address] [place], [place] [address]    Special Requests   Final    BOTTLES DRAWN AEROBIC AND ANAEROBIC Blood Culture results may not be optimal due to an excessive volume of blood received in culture bottles Performed at [facility], [address] [place], [place] [address]    Culture  Setup Time   Final    [place] CRITICAL RESULT CALLED TO, READ BACK BY AND VERIFIED WITH: MORGAN CUNNINGHAM  AT  [place] ON 09/09/19 SNG    Culture GRAM POSITIVE COCCI  Final   Report Status PENDING  Incomplete  Blood Culture ID Panel (Reflexed)     Status: Abnormal   Collection Time: 09/08/19 12:07 PM  Result Value Ref Range Status   Enterococcus faecalis NOT DETECTED NOT DETECTED Final   Enterococcus Faecium NOT DETECTED NOT DETECTED Final   Listeria monocytogenes NOT DETECTED NOT DETECTED Final   Staphylococcus species DETECTED (A) NOT DETECTED Final    Comment: CRITICAL RESULT CALLED TO, READ BACK BY AND VERIFIED WITH: MORGAN CUNNINGHAM AT 1345 ON 09/09/19 SNG    Staphylococcus aureus (BCID) NOT DETECTED NOT DETECTED Final   Staphylococcus epidermidis DETECTED (A) NOT DETECTED Final    Comment: CRITICAL RESULT CALLED  TO, READ BACK BY AND VERIFIED WITH: MORGAN CUNNINGHAM AT 2130 ON 09/09/19 SNG    Staphylococcus lugdunensis NOT DETECTED NOT DETECTED Final   Streptococcus species NOT DETECTED NOT DETECTED Final   Streptococcus agalactiae NOT DETECTED NOT DETECTED Final   Streptococcus pneumoniae NOT DETECTED NOT DETECTED Final   Streptococcus pyogenes NOT DETECTED NOT DETECTED Final   A.calcoaceticus-baumannii NOT DETECTED NOT DETECTED Final   Bacteroides fragilis NOT DETECTED NOT DETECTED Final   Enterobacterales NOT DETECTED NOT DETECTED Final   Enterobacter cloacae complex NOT DETECTED NOT DETECTED Final   Escherichia coli NOT DETECTED NOT DETECTED Final   Klebsiella aerogenes NOT DETECTED NOT DETECTED Final   Klebsiella oxytoca NOT DETECTED NOT DETECTED Final   Klebsiella pneumoniae NOT DETECTED NOT DETECTED Final   Proteus species NOT DETECTED NOT DETECTED Final   Salmonella species NOT DETECTED NOT DETECTED Final   Serratia marcescens NOT DETECTED NOT DETECTED Final   Haemophilus influenzae NOT DETECTED NOT DETECTED Final   Neisseria meningitidis NOT DETECTED NOT DETECTED Final   Pseudomonas aeruginosa NOT DETECTED NOT DETECTED Final   Stenotrophomonas maltophilia NOT DETECTED NOT  DETECTED Final   Candida albicans NOT DETECTED NOT DETECTED Final   Candida auris NOT DETECTED NOT DETECTED Final   Candida glabrata NOT DETECTED NOT DETECTED Final   Candida krusei NOT DETECTED NOT DETECTED Final   Candida parapsilosis NOT DETECTED NOT DETECTED Final   Candida tropicalis NOT DETECTED NOT DETECTED Final   Cryptococcus neoformans/gattii NOT DETECTED NOT DETECTED Final   Methicillin resistance mecA/C NOT DETECTED NOT DETECTED Final    Comment: Performed at [facility], [address] [place], [place] [address]  Blood culture (routine x 2)     Status: None (Preliminary result)   Collection Time: 09/08/19 12:59 PM   Specimen: BLOOD  Result Value Ref Range Status   Specimen Description BLOOD LEFT UPPER FORARM  Final   Special Requests   Final    BOTTLES DRAWN AEROBIC AND ANAEROBIC Blood Culture results may not be optimal due to an excessive volume of blood received in culture bottles   Culture   Final    NO GROWTH 2 DAYS Performed at [facility], [address] [place],  [address]    Report Status PENDING  Incomplete    Coagulation Studies: Recent Labs    09/08/19 1206  LABPROT 14.9  INR 1.2    Urinalysis: Recent Labs    09/08/19 1205  COLORURINE AMBER*  LABSPEC 1.015  PHURINE 5.0  GLUCOSEU NEGATIVE  HGBUR NEGATIVE  BILIRUBINUR NEGATIVE  KETONESUR NEGATIVE  PROTEINUR 30*  NITRITE NEGATIVE  LEUKOCYTESUR NEGATIVE      Imaging: US RENAL  Result Date: 09/08/2019 CLINICAL DATA:  ATN EXAM: RENAL / URINARY TRACT ULTRASOUND COMPLETE COMPARISON:  None. FINDINGS: Right Kidney: Renal measurements: 10.2 x 4.8 x 5.2 cm = volume: 132 mL. Echogenicity within normal limits. No hydronephrosis. There is a cystic mass in the mid pole the right kidney measuring 1.2 cm. Left Kidney: Renal measurements: 9.1 x 4.8 x 4.3 cm = volume: 97 mL. Echogenicity within normal limits. No mass or hydronephrosis visualized. Bladder: Not visualized. Other:  Exam limited by patient body habitus and inability to change position. IMPRESSION: 1. Limited exam. No definite acute finding in the bilateral kidneys. 2.  Small cyst in the midpole of the right kidney. Electronically Signed   By: Audie Pinto M.D.   On: 09/08/2019 17:06   US Venous Img Lower Bilateral (DVT)  Result Date: 09/09/2019  CLINICAL DATA:  Swelling in the bilateral legs, rule out DVT. EXAM: BILATERAL LOWER EXTREMITY VENOUS DOPPLER ULTRASOUND TECHNIQUE: Gray-scale sonography with compression, as well as color and duplex ultrasound, were performed to evaluate the deep venous system(s) from the level of the common femoral vein through the popliteal and proximal calf veins. COMPARISON:  None. FINDINGS: VENOUS Normal compressibility of the common femoral, superficial femoral, and popliteal veins, as well as the visualized calf veins. Visualized portions of profunda femoral vein and great saphenous vein unremarkable. No filling defects to suggest DVT on grayscale or color Doppler imaging. Doppler waveforms show normal direction of venous flow, normal respiratory plasticity and response to augmentation. Limited views of the contralateral common femoral vein are unremarkable. OTHER None. Limitations: none IMPRESSION: Negative for DVT in the bilateral lower extremities. Electronically Signed   By: Audie Pinto M.D.   On: 09/09/2019 14:38   DG Chest Port 1 View  Result Date: 09/09/2019 CLINICAL DATA:  Central line placement, history of CHF EXAM: PORTABLE CHEST 1 VIEW COMPARISON:  09/08/2019 FINDINGS: Left IJ double-lumen central line tip overlies the SVC. No pneumothorax. There is similar lung aeration with patchy bilateral opacities. Probable small bilateral pleural effusions. Top-normal heart size. Possible enlargement of the main pulmonary artery. IMPRESSION: Interval left IJ line placement with tip overlying SVC. No pneumothorax. Lung aeration is not substantially changed. Electronically Signed    By: Macy Mis M.D.   On: 09/09/2019 08:07   DG Chest Portable 1 View  Result Date: 09/08/2019 CLINICAL DATA:  Oxygen desaturation, hypoglycemia, shortness of breath, history COPD, CHF EXAM: PORTABLE CHEST 1 VIEW COMPARISON:  Portable exam 1224 hours without priors for comparison FINDINGS: Upper normal heart size. Mediastinal contours normal. Patchy infiltrates bilaterally greater on RIGHT, question pneumonia, asymmetric edema considered less likely. Probable small LEFT pleural effusion. No pneumothorax. IMPRESSION: BILATERAL pulmonary infiltrates favoring pneumonia. Probable small LEFT pleural effusion. Electronically Signed   By: Lavonia Dana M.D.   On: 09/08/2019 13:06   ECHOCARDIOGRAM COMPLETE  Result Date: 09/09/2019    ECHOCARDIOGRAM REPORT   Patient Name:   Andrew Valentine   Date of Exam: 09/09/2019 Medical Rec #:  [id number]  Height:       68.0 in Accession #:    [id number] Weight:       330.0 lb Date of Birth:  Nov 23, 1940  BSA:          2.528 m Patient Age:    79 years   BP:           115/48 mmHg Patient Gender: M          HR:           72 bpm. Exam Location:  [facility] Procedure: 2D Echo, Cardiac Doppler and Color Doppler Indications:     Atrial Fibrillation 427.31  History:         Patient has no prior history of Echocardiogram examinations.                  CHF; Risk Factors:Hypertension and Diabetes.  Sonographer:     Sherrie Sport RDCS (AE) Referring Phys:  [place] Diagnosing Phys: Ida Rogue MD  Sonographer Comments: No apical window, no subcostal window and Technically difficult study due to poor echo windows. IMPRESSIONS  1. Left ventricular ejection fraction, by estimation, is 55 to 60%. The left ventricle has normal function. The left ventricle has no regional wall motion abnormalities. Left ventricular diastolic parameters are indeterminate. Septal flattening in systole consistent  with elevated right heart pressures.  2. Right ventricular systolic function was not well visualized.  The right ventricular size is moderately enlarged. There is severely elevated pulmonary artery systolic pressure. The estimated right ventricular systolic pressure is 40.9 mmHg.  3. Left atrial size was mildly dilated. FINDINGS  Left Ventricle: Left ventricular ejection fraction, by estimation, is 55 to 60%. The left ventricle has normal function. The left ventricle has no regional wall motion abnormalities. The left ventricular internal cavity size was normal in size. There is  no left ventricular hypertrophy. Left ventricular diastolic parameters are indeterminate. Right Ventricle: The right ventricular size is moderately enlarged. No increase in right ventricular wall thickness. Right ventricular systolic function was not well visualized. There is severely elevated pulmonary artery systolic pressure. The tricuspid  regurgitant velocity is 3.94 m/s, and with an assumed right atrial pressure of 10 mmHg, the estimated right ventricular systolic pressure is 73.5 mmHg. Left Atrium: Left atrial size was mildly dilated. Right Atrium: Right atrial size was normal in size. Pericardium: There is no evidence of pericardial effusion. Mitral Valve: The mitral valve was not well visualized. Normal mobility of the mitral valve leaflets. No evidence of mitral valve regurgitation. No evidence of mitral valve stenosis. Tricuspid Valve: The tricuspid valve is not well visualized. Tricuspid valve regurgitation is not demonstrated. No evidence of tricuspid stenosis. Aortic Valve: The aortic valve was not well visualized. Aortic valve regurgitation is not visualized. Mild aortic valve sclerosis is present, with no evidence of aortic valve stenosis. Pulmonic Valve: The pulmonic valve was not well visualized. Pulmonic valve regurgitation is not visualized. No evidence of pulmonic stenosis. Aorta: The aortic root is normal in size and structure and the aortic root was not well visualized. Venous: The inferior vena cava is normal in size  with greater than 50% respiratory variability, suggesting right atrial pressure of 3 mmHg. IAS/Shunts: No atrial level shunt detected by color flow Doppler.  LEFT VENTRICLE PLAX 2D LVIDd:         5.24 cm LVIDs:         2.96 cm LV PW:         1.11 cm LV IVS:        0.96 cm LVOT diam:     2.00 cm LVOT Area:     3.14 cm  LEFT ATRIUM         Index LA diam:    4.50 cm 1.78 cm/m                        PULMONIC VALVE AORTA                 PV Vmax:        0.64 m/s Ao Root diam: 3.20 cm PV Peak grad:   1.6 mmHg                       RVOT Peak grad: 2 mmHg  TRICUSPID VALVE TR Peak grad:   62.1 mmHg TR Vmax:        394.00 cm/s  SHUNTS Systemic Diam: 2.00 cm Ida Rogue MD Electronically signed by Ida Rogue MD Signature Date/Time: 09/09/2019/3:00:55 PM    Final      Medications:   . cefTRIAXone (ROCEPHIN)  IV Stopped (09/09/19 1530)  . fluconazole (DIFLUCAN) IV Stopped (09/10/19 0044)  . heparin 1,400 Units/hr (09/10/19 0800)  . norepinephrine (LEVOPHED) Adult infusion 24 mcg/min (09/10/19 0800)  . prismasol BGK 2/2.5  dialysis solution    . prismasol BGK 2/2.5 replacement solution    . prismasol BGK 2/2.5 replacement solution    . vasopressin 0.03 Units/min (09/10/19 0800)   . azithromycin  250 mg Oral Daily  . Chlorhexidine Gluconate Cloth  6 each Topical Daily  . insulin aspart  0-15 Units Subcutaneous Q4H  . ipratropium-albuterol  3 mL Nebulization Q6H  . levothyroxine  175 mcg Oral Daily  . pantoprazole (PROTONIX) IV  40 mg Intravenous Q24H  . senna  1 tablet Oral BID   acetaminophen **OR** acetaminophen, heparin, heparin sodium (porcine), ondansetron **OR** ondansetron (ZOFRAN) IV, polyethylene glycol, sodium chloride  Assessment/ Plan:  Mr. Hoke Baer is a 79 y.o. 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, hypoventilation syndrome, hyperlipidemia who is admitted to [facility] on 09/08/2019 for Acidosis  [E87.2] Hyperkalemia [E87.5] ATN (acute tubular necrosis) (HCC) [N17.0] Acute renal failure with tubular necrosis (HCC) [N17.0] Acute renal failure (HCC) [N17.9] Hypoxia [R09.02] NSTEMI (non-ST elevated myocardial infarction) ([place]) [I21.4] AKI (acute kidney injury) ([place]) [N17.9] Acute hypoxemic respiratory failure (HCC) [J96.01] Hypotension due to hypovolemia [I95.89, E86.1]  1. Acute renal failure with hyperkalemia: on chronic kidney disease stage IIIB. Creatinine baseline reported to be 1.5 by son.  Nonoliguric urine output.  - requiring CRRT.  - Change to 2K bath  2. Hypotension: requiring vasopressors. With cardiogenic and possible septic shock from pneumonia - Appreciate cardiology input.  - empiric antibiotics.   3. Acute respiratory failure with respiratory acidosis: with acute exacerbation of chronic diastolic congestive heart failure  - discontinue IV Lactated [place] infusion.  - Appreciate pulmonary input.    LOS: 2 Rayaan Garguilo 8/30/20219:06 AM

## 2019-09-10 NOTE — Consult Note (Signed)
[place] for heparin dosing  Indication: chest pain/ACS  Allergies  Allergen Reactions  . Penicillins Rash    Patient Measurements: Height: 5\' 8"  (172.7 cm) Weight: (!) 162.3 kg (357 lb 12.9 oz) IBW/kg (Calculated) : 68.4 Heparin Dosing Weight: 104.8  Vital Signs: Temp: 98.9 F (37.2 C) (08/30 0400) Temp Source: Axillary (08/30 0400) BP: 105/54 (08/30 0630) Pulse Rate: 74 (08/30 0630)  Labs: Recent Labs    09/08/19 1205 09/08/19 1205 09/08/19 1206 09/08/19 1607 09/09/19 0042 09/09/19 0329 09/09/19 0607 09/09/19 1004 09/09/19 1218 09/09/19 1613 09/10/19 0409  HGB 11.2*   < >  --   --   --  10.7*  --   --   --   --  10.3*  HCT 37.7*  --   --   --   --  36.3*  --   --   --   --  34.1*  PLT 174  --   --   --   --  186  --   --   --   --  156  APTT  --   --  34  --   --   --   --   --   --   --   --   LABPROT  --   --  14.9  --   --   --   --   --   --   --   --   INR  --   --  1.2  --   --   --   --   --   --   --   --   HEPARINUNFRC  --   --   --   --  0.51  --   --   --  0.37  --  0.35  CREATININE 4.47*   < >  --   --   --  4.49*  --   --   --  4.44* 3.67*  CKTOTAL  --   --   --   --   --  2,443*  --   --   --   --   --   TROPONINIHS 181*   < >  --  445*  --   --  1,541* 2,333*  --   --   --    < > = values in this interval not displayed.    Estimated Creatinine Clearance: 24.5 mL/min (A) (by C-G formula based on SCr of 3.67 mg/dL (H)).   Medical History: Past Medical History:  Diagnosis Date  . Congestive heart failure (CHF) ([place])   . Hypertension   . Hypothyroidism   . Morbid obesity ([place])   . Obesity hypoventilation syndrome ([place])   . Type 2 diabetes mellitus (HCC)     Medications:  Scheduled:  . azithromycin  250 mg Oral Daily  . Chlorhexidine Gluconate Cloth  6 each Topical Daily  . insulin aspart  0-15 Units Subcutaneous Q4H  . ipratropium-albuterol  3 mL Nebulization Q6H  . levothyroxine  175 mcg Oral  Daily  . pantoprazole (PROTONIX) IV  40 mg Intravenous Q24H  . senna  1 tablet Oral BID    Assessment: 79 year old male presenting to the ED with AMS, hypoglycemia, and hypotension. Troponins 181, hgb 11.2, plt 174.  8/29 0042 HL 0.51  8/29 1218 HL 0.37   Goal of Therapy:  Heparin level 0.3-0.7 units/ml Monitor platelets by anticoagulation protocol:  Yes   Plan:  08/30 @ 0500 HL 0.35 therapeutic. Will continue current rate and will recheck HL w/ am labs and continue to monitor.  Tobie Lords, PharmD, BCPS Clinical Pharmacist 09/10/2019 7:02 AM

## 2019-09-10 NOTE — Progress Notes (Addendum)
Pt sleeping, difficult to arouse this afternoon.  BiPap 40% FIO2 placed as precautionary measure d/t hx OSA needing CPAP at home (although pt is not compliant with that therapy.)  ABG drawn, mild acidosis and hypercapnia, PO2 ok.  Per Dr Lanney Gins keep bipap in place for now.  Pt's daughter Daneil Dan visited him and left his dentures at bedside.  She did take the gold colored necklace with cross that was in the closet.  His eyeglasses will remain in the room for now.  Daneil Dan was updated on the POC and verbalizes understanding.  She did give writing RN a brief history of the pt's last day at home before he came to the hospital.

## 2019-09-10 NOTE — Progress Notes (Addendum)
Pt observed with pronounced wet cough/throat clearing while attempting to eat breakfast.  NPO order placed and SLP consulted.  Per Dr Juleen China change CRRT bath to 2K and stop LR infusion. Pt pleasantly confused and very forgetful.  He does admit to repeated episodes of coughing/choking with PO intake at home with both solid food and liquids. O2 titrated to 2 LPM [place] and tolerated well.  He was not able to consistently tolerate room air. Intermittent confusion; needs redirection as he tries to take off gown (I need to take off this jacket), does not remember he is in the hospital, cannot state where he lives, denies that he lives in [place]?

## 2019-09-10 NOTE — Progress Notes (Signed)
OT Cancellation Note  Patient Details Name: Andrew Valentine MRN: [id number] DOB: 10-12-1940   Cancelled Treatment:    Reason Eval/Treat Not Completed: Patient not medically ready  OT consult received and chart reviewed. Upon chart review this AM, noted that pt with soft BP. In addition, per PT, upon speaking with nursing, pt is on continuous HD. Not appropriate for therapy participation at this time. Will f/u for OT evaluation as pt becomes more appropriate/as able. Thank you.  Gerrianne Scale, [place], OTR/L ascom [phone] 09/10/19, 9:23 AM

## 2019-09-10 NOTE — Evaluation (Signed)
Clinical/Bedside Swallow Evaluation Patient Details  Name: Andrew Valentine MRN: [id number] Date of Birth: [birth year]  Today's Date: 09/10/2019 Time: SLP Start Time (ACUTE ONLY): 1015 SLP Stop Time (ACUTE ONLY): 1105 SLP Time Calculation (min) (ACUTE ONLY): 50 min  Past Medical History:  Past Medical History:  Diagnosis Date  . Congestive heart failure (CHF) ([place])   . Hypertension   . Hypothyroidism   . Morbid obesity ([place])   . Obesity hypoventilation syndrome ([place])   . Type 2 diabetes mellitus ([place])    Past Surgical History:  Past Surgical History:  Procedure Laterality Date  . none     HPI:  Pt is a 9M hx of Morbid obesity BMI >50, CHF, DM, hypothoroidism, came from home due to dehydration and AKI.  He lives alone and cannot take care of himself. He was noted to be lethargic in ED with hypercapnia and hypoxemia. Per Daughter, she stated pt had been feeling poorly for 1+ week w/ poor appetite. She came to check on him the morning of admit and blood glucose was 47, he was unable to get out of chair. He was weak could not walk, desaturating 70s.  He was encephalopathic at this time. Daughter lives next door.    Assessment / Plan / Recommendation Clinical Impression  Pt remains on continuous On CRRT. Requiring vasopressin and norepinephrine and followed by Nephrology. Pt is min more alert and communicating more w/ less confusion per report. At this BSE today, he appears to present w/ adequate oropharyngeal phase swallow function w/ No gross oropharyngeal phase dysphagia noted, No neuromuscular deficits noted.Pt consumed po trials w/ No immediate overt, clinical s/s of aspiration during po trials. Pt appears at reduced risk for aspiration when following general aspiration precautions. Pt requires FULL support in positioning fully upright for safe oral intake; he appears challenged finding proper upright positioning in the bed d/t his size and overall weakness. Swallowing success and safety would be  increased w/ pt eating/drinking out of the bed in a chair. During po trials, pt consumed all consistencies w/ no imediate, overt coughing, decline in vocal quality, or change in respiratory presentation during/post trials. O2 sats remained in low 90s as at baseline. Oral phase appeared grossly [facility] w/ timely bolus management and control of bolus propulsion for A-P transfer for swallowing. Oral clearing achieved w/ all trial consistencies. Increased oral phase time noted w/ trials of increased texture d/t his Edentulous status baseline despite moistening the foods. Noted increased respiratory effort w/ the increased oral phase time which can increase risk for aspiration w/ bolus. Rest Breaks were given to allow pt time to calm his breathing, RR. OM Exam appeared [facility] w/ no unilateral weakness noted. Speech Clear. Pt required setup and support w/ feeidng -- UEs were weak and often dropped the Cup when drinking(no straws used). Recommend a more Pureed diet consistency of Dysphagia level 1, moistened foods; Thin liquidsVIA CUP - NO Straws currently. Recommend general aspiration precautions, Pills WHOLE in Puree for safer, easier swallowing. Education given on Pills in Puree; food consistencies and easy to eat options; general aspiration precautions. ST services will f/u w/ toleration of diet and trials to upgrade next 1-3 days as pt's medical status improves. NSG agreed. SLP Visit Diagnosis: Dysphagia, oropharyngeal phase (R13.12) (Edentulous )    Aspiration Risk  Mild aspiration risk;Risk for inadequate nutrition/hydration (Dietician f/u)    Diet Recommendation  Dysphagia level 1 (puree) w/ Thin liquids; general aspiration precautions including NO Straws currently. Full assistance w/ feeding  at meals; setup support and Positioning Support for upright, head forward position in bed/chair  Medication Administration: Whole meds with puree (for safer swallowing)    Other  Recommendations Recommended Consults:   (dietician f/u) Oral Care Recommendations: Oral care BID;Oral care before and after PO;Staff/trained caregiver to provide oral care Other Recommendations:  (n/a)   Follow up Recommendations  (TBD)      Frequency and Duration min 3x week  2 weeks       Prognosis Prognosis for Safe Diet Advancement: Fair (-Good) Barriers to Reach Goals: Time post onset;Severity of deficits      Swallow Study   General Date of Onset: 09/08/19 HPI: Pt is a 89M hx of Morbid obesity BMI >50, CHF, DM, hypothoroidism, came from home due to dehydration and AKI.  He lives alone and cannot take care of himself. He was noted to be lethargic in ED with hypercapnia and hypoxemia. Per Daughter, she stated pt had been feeling poorly for 1+ week w/ poor appetite. She came to check on him the morning of admit and blood glucose was 47, he was unable to get out of chair. He was weak could not walk, desaturating 70s.  He was encephalopathic at this time. Daughter lives next door.  Type of Study: Bedside Swallow Evaluation Previous Swallow Assessment: none indicated Diet Prior to this Study: NPO (had been on a regular diet per report) Temperature Spikes Noted: No (wbc 11.5) Respiratory Status: Nasal cannula (2-3L) History of Recent Intubation: No Behavior/Cognition: Alert;Cooperative;Pleasant mood;Distractible;Requires cueing Oral Cavity Assessment: Within Functional Limits Oral Care Completed by SLP: Recent completion by staff Oral Cavity - Dentition: Edentulous Vision: Functional for self-feeding (appeared) Self-Feeding Abilities: Needs assist;Needs set up;Total assist Patient Positioning: Upright in bed (needed complete assistance w/ sitting upright in bed) Baseline Vocal Quality: Normal (but often mumbled/muttered) Volitional Cough: Strong Volitional Swallow: Able to elicit    Oral/Motor/Sensory Function Overall Oral Motor/Sensory Function: Within functional limits   Ice Chips Ice chips: Within functional  limits Presentation: Spoon (fed; 3 trials)   Thin Liquid Thin Liquid: Within functional limits Presentation: Cup;Self Fed (fully supported; 10 trials)    Nectar Thick Nectar Thick Liquid: Not tested   Honey Thick Honey Thick Liquid: Not tested   Puree Puree: Within functional limits Presentation: Spoon (fed; 6 trials)   Solid     Solid: Impaired Presentation: Spoon (fed; 6 trials) Oral Phase Impairments: Impaired mastication (Edentulous baseline) Oral Phase Functional Implications: Impaired mastication (moistened cookies well) Pharyngeal Phase Impairments:  (none)       Orinda Kenner, MS, CCC-SLP Maegan Buller 09/10/2019,5:00 PM

## 2019-09-10 NOTE — Consult Note (Signed)
WOC Nurse Consult Note: Reason for Consult: pannus; skin tears Patient with fall at home. Currently on CVVHD Wound type: Intertriginous skin damage related to moisture Moisture associated skin damage bilateral buttocks related to incontinence Deep tissue injury vs trauma upper inner buttocks/and bilateral buttocks Pressure Injury POA: Yes Measurement: Buttocks; 13cm x 4cm x 0 scattered areas of skin loss from incontinence  Right elbow see nursing flow sheet Left dorsal foot see nursing flow sheet Redness under pannus  Wound bed: Partial thickness skin loss on the right elbow and left dorsal foot; skin tears vs weeping related to edema (left dorsal foot) Dark purple tissue but appears to present more like bruising than pattern of typical DTPI over buttocks and upper inner gluteal cleft.  Drainage (amount, consistency, odor) sanguinous weeping in the buttocks fold Periwound: intact; edematous  Dressing procedure/placement/frequency: ICU bed LALM for moisture management and pressure redistribution Skin care order set for skin tears, non adherent (vaseline gauze) and foam topper Triple paste zinc barrier for the buttocks daily and after each episode of incontinence Antimicrobial wicking fabric to address ITD.   Discussed POC with patient and bedside nurse.  Re consult if needed, will not follow at this time. Thanks  Jaykwon Morones [employer], RN,CWOCN, CNS, [place] [phone])

## 2019-09-10 NOTE — Progress Notes (Signed)
CRITICAL CARE PROGRESS NOTE    Name: Andrew Valentine MRN: [id number] DOB: May 02, 1940     LOS: 2   SUBJECTIVE FINDINGS & SIGNIFICANT EVENTS    Patient description:  79M hx of Morbid obesity BMI >50, CHF, DM, hypothoroidism, came from home due to dehydration and AKI.  He lives alone and cannot take care of himself. He was noted to be lethargic in ED with hypercapnia and hypoxemia.  I spoke to daughter she states she came to check on him and blood glucose was 47, he was unable to get out of chair. He was weak could not walk, desaturating 70s.  He was encephalopathic at this time. Daughter lives next door.    09/10/19- patient is awake this am, he on levphed 25, hes on CRRT with dyasylate modifcation this am by renal team. S/p wound care eval, UOP has improved.     Lines/tubes : Urethral Catheter Maureen RN Straight-tip 16 Fr. (Active)  Indication for Insertion or Continuance of Catheter Unstable critically ill patients first 24-48 hours (See Criteria) 09/09/19 0729  Site Assessment Clean;Intact 09/09/19 0729  Catheter Maintenance Bag below level of bladder;Drainage bag/tubing not touching floor;No dependent loops;Seal intact 09/09/19 0729  Collection Container Standard drainage bag 09/09/19 0729  Securement Method Securing device (Describe) 09/09/19 0729  Urinary Catheter Interventions (if applicable) Unclamped 83/38/25 0729  Output (mL) 40 mL 09/09/19 0400    Microbiology/Sepsis markers: Results for orders placed or performed during the hospital encounter of 09/08/19  MRSA PCR Screening     Status: None   Collection Time: 09/08/19 11:49 AM   Specimen: Nasopharyngeal  Result Value Ref Range Status   MRSA by PCR NEGATIVE NEGATIVE Final    Comment:        The GeneXpert MRSA Assay (FDA approved for NASAL  specimens only), is one component of a comprehensive MRSA colonization surveillance program. It is not intended to diagnose MRSA infection nor to guide or monitor treatment for MRSA infections. Performed at [facility] [address] [place], [place] [address]   SARS Coronavirus 2 by RT PCR (hospital order, performed in [facility] hospital lab) Nasopharyngeal Nasopharyngeal Swab     Status: None   Collection Time: 09/08/19 12:05 PM   Specimen: Nasopharyngeal Swab  Result Value Ref Range Status   SARS Coronavirus 2 NEGATIVE NEGATIVE Final    Comment: (NOTE) SARS-CoV-2 target nucleic acids are NOT DETECTED.  The SARS-CoV-2 RNA is generally detectable in upper and lower respiratory specimens during the acute phase of infection. The lowest concentration of SARS-CoV-2 viral copies this assay can detect is 250 copies / mL. A negative result does not preclude SARS-CoV-2 infection and should not be used as the sole basis for treatment or other patient management decisions.  A negative result may occur with improper specimen collection / handling, submission of specimen other than nasopharyngeal swab, presence of viral mutation(s) within the areas targeted by this assay, and inadequate number of viral copies (<250 copies / mL). A negative result must be combined with clinical observations, patient history, and epidemiological information.  Fact Sheet for Patients:   StrictlyIdeas.no  Fact Sheet for Healthcare Providers: BankingDealers.co.za  This test is not yet approved or  cleared by the [place] FDA and has been authorized for detection and/or diagnosis of SARS-CoV-2 by FDA under an Emergency Use Authorization (EUA).  This EUA will remain in effect (meaning this test can be used) for the duration of the COVID-19 declaration under Section 564(b)(1) of the  Act, 21 U.S.C. section 360bbb-3(b)(1), unless the authorization  is terminated or revoked sooner.  Performed at [facility], [address] [place], [place] [address]   Blood culture (routine x 2)     Status: Abnormal (Preliminary result)   Collection Time: 09/08/19 12:07 PM   Specimen: BLOOD  Result Value Ref Range Status   Specimen Description   Final    BLOOD L DISTAL FOREARM Performed at [facility], [address], [place], [place] [address]    Special Requests   Final    BOTTLES DRAWN AEROBIC AND ANAEROBIC Blood Culture results may not be optimal due to an excessive volume of blood received in culture bottles Performed at [facility], [address] [place], [place] [address]    Culture  Setup Time   Final    GRAM POSITIVE COCCI AEROBIC BOTTLE ONLY CRITICAL RESULT CALLED TO, READ BACK BY AND VERIFIED WITH: MORGAN CUNNINGHAM  AT 8938 ON 09/09/19 SNG    Culture STAPHYLOCOCCUS EPIDERMIDIS (A)  Final   Report Status PENDING  Incomplete  Blood Culture ID Panel (Reflexed)     Status: Abnormal   Collection Time: 09/08/19 12:07 PM  Result Value Ref Range Status   Enterococcus faecalis NOT DETECTED NOT DETECTED Final   Enterococcus Faecium NOT DETECTED NOT DETECTED Final   Listeria monocytogenes NOT DETECTED NOT DETECTED Final   Staphylococcus species DETECTED (A) NOT DETECTED Final    Comment: CRITICAL RESULT CALLED TO, READ BACK BY AND VERIFIED WITH: MORGAN CUNNINGHAM AT 1345 ON 09/09/19 SNG    Staphylococcus aureus (BCID) NOT DETECTED NOT DETECTED Final   Staphylococcus epidermidis DETECTED (A) NOT DETECTED Final    Comment: CRITICAL RESULT CALLED TO, READ BACK BY AND VERIFIED WITH: MORGAN CUNNINGHAM AT 1345 ON 09/09/19 SNG    Staphylococcus lugdunensis NOT DETECTED NOT DETECTED Final   Streptococcus species NOT DETECTED NOT DETECTED Final   Streptococcus agalactiae NOT DETECTED NOT DETECTED Final   Streptococcus pneumoniae NOT DETECTED NOT DETECTED Final   Streptococcus pyogenes NOT DETECTED NOT DETECTED  Final   A.calcoaceticus-baumannii NOT DETECTED NOT DETECTED Final   Bacteroides fragilis NOT DETECTED NOT DETECTED Final   Enterobacterales NOT DETECTED NOT DETECTED Final   Enterobacter cloacae complex NOT DETECTED NOT DETECTED Final   Escherichia coli NOT DETECTED NOT DETECTED Final   Klebsiella aerogenes NOT DETECTED NOT DETECTED Final   Klebsiella oxytoca NOT DETECTED NOT DETECTED Final   Klebsiella pneumoniae NOT DETECTED NOT DETECTED Final   Proteus species NOT DETECTED NOT DETECTED Final   Salmonella species NOT DETECTED NOT DETECTED Final   Serratia marcescens NOT DETECTED NOT DETECTED Final   Haemophilus influenzae NOT DETECTED NOT DETECTED Final   Neisseria meningitidis NOT DETECTED NOT DETECTED Final   Pseudomonas aeruginosa NOT DETECTED NOT DETECTED Final   Stenotrophomonas maltophilia NOT DETECTED NOT DETECTED Final   Candida albicans NOT DETECTED NOT DETECTED Final   Candida auris NOT DETECTED NOT DETECTED Final   Candida glabrata NOT DETECTED NOT DETECTED Final   Candida krusei NOT DETECTED NOT DETECTED Final   Candida parapsilosis NOT DETECTED NOT DETECTED Final   Candida tropicalis NOT DETECTED NOT DETECTED Final   Cryptococcus neoformans/gattii NOT DETECTED NOT DETECTED Final   Methicillin resistance mecA/C NOT DETECTED NOT DETECTED Final    Comment: Performed at [facility], [address] [place], [place] [address]  Blood culture (routine x 2)     Status: None (Preliminary result)   Collection Time: 09/08/19 12:59 PM  Specimen: BLOOD  Result Value Ref Range Status   Specimen Description BLOOD LEFT UPPER FORARM  Final   Special Requests   Final    BOTTLES DRAWN AEROBIC AND ANAEROBIC Blood Culture results may not be optimal due to an excessive volume of blood received in culture bottles   Culture   Final    NO GROWTH 2 DAYS Performed at [facility], [address], [place], [place] [address]    Report Status PENDING  Incomplete     Anti-infectives:  Anti-infectives (From admission, onward)   Start     Dose/Rate Route Frequency Ordered Stop   09/09/19 2200  fluconazole (DIFLUCAN) IVPB 400 mg        400 mg 100 mL/hr over 120 Minutes Intravenous Every 24 hours 09/09/19 1300 09/15/19 2159   09/09/19 1500  cefTRIAXone (ROCEPHIN) 2 g in sodium chloride 0.9 % 100 mL IVPB        2 g 200 mL/hr over 30 Minutes Intravenous Every 24 hours 09/09/19 1321     09/09/19 1000  azithromycin (ZITHROMAX) tablet 250 mg       "Followed by" Linked Group Details   250 mg Oral Daily 09/08/19 1437 09/13/19 0959   09/08/19 2151  fluconazole (DIFLUCAN) IVPB 200 mg  Status:  Discontinued        200 mg 100 mL/hr over 60 Minutes Intravenous Every 24 hours 09/08/19 1850 09/09/19 1300   09/08/19 1445  cefTRIAXone (ROCEPHIN) 1 g in sodium chloride 0.9 % 100 mL IVPB  Status:  Discontinued        1 g 200 mL/hr over 30 Minutes Intravenous Every 24 hours 09/08/19 1436 09/09/19 1321   09/08/19 1445  azithromycin (ZITHROMAX) tablet 500 mg       "Followed by" Linked Group Details   500 mg Oral Daily 09/08/19 1436 09/08/19 1548       Consults: Treatment Team:  Minna Merritts, MD Liana Gerold, MD     PAST MEDICAL HISTORY   Past Medical History:  Diagnosis Date  . Congestive heart failure (CHF) ([place])   . Hypertension   . Hypothyroidism   . Morbid obesity ([place])   . Obesity hypoventilation syndrome ([place])   . Type 2 diabetes mellitus ([place])      SURGICAL HISTORY   Past Surgical History:  Procedure Laterality Date  . none       FAMILY HISTORY   Family History  Family history unknown: Yes     SOCIAL HISTORY   Social History   Tobacco Use  . Smoking status: Former Research scientist (life sciences)  . Smokeless tobacco: Never Used  Substance Use Topics  . Alcohol use: Not Currently  . Drug use: Never     MEDICATIONS   Current Medication:  Current Facility-Administered Medications:  .  acetaminophen (TYLENOL) tablet 650 mg, 650  mg, Oral, Q6H PRN **OR** acetaminophen (TYLENOL) suppository 650 mg, 650 mg, Rectal, Q6H PRN, Fritzi Mandes, MD .  Margrett Rud azithromycin (ZITHROMAX) tablet 500 mg, 500 mg, Oral, Daily, 500 mg at 09/08/19 1548 **FOLLOWED BY** azithromycin (ZITHROMAX) tablet 250 mg, 250 mg, Oral, Daily, Fritzi Mandes, MD, 250 mg at 09/10/19 0801 .  cefTRIAXone (ROCEPHIN) 2 g in sodium chloride 0.9 % 100 mL IVPB, 2 g, Intravenous, Q24H, Laquenta Whitsell, MD, Stopping Infusion hung by another clincian at 09/09/19 1530 .  Chlorhexidine Gluconate Cloth 2 % PADS 6 each, 6 each, Topical, Daily, Tukov-Yual, Magdalene S, NP, 6 each at 09/10/19 0600 .  fluconazole (DIFLUCAN) IVPB 400  mg, 400 mg, Intravenous, Q24H, Dallie Piles, [facility], Stopped at 09/10/19 0044 .  heparin ADULT infusion 100 units/mL (25000 units/277mL sodium chloride 0.45%), 1,400 Units/hr, Intravenous, Continuous, Benn Moulder, RPH, Last Rate: 14 mL/hr at 09/10/19 1000, 1,400 Units/hr at 09/10/19 1000 .  heparin injection 1,000-6,000 Units, 1,000-6,000 Units, CRRT, PRN, Bhutani, Manpreet S, MD .  heparin sodium (porcine) injection 1,000-6,000 Units, 1,000-6,000 Units, Intracatheter, PRN, Fritzi Mandes, MD .  insulin aspart (novoLOG) injection 0-15 Units, 0-15 Units, Subcutaneous, Q4H, Jordani Nunn, MD .  ipratropium-albuterol (DUONEB) 0.5-2.5 (3) MG/3ML nebulizer solution 3 mL, 3 mL, Nebulization, Q6H, Tukov-Yual, Magdalene S, NP, 3 mL at 09/10/19 0749 .  levothyroxine (SYNTHROID) tablet 175 mcg, 175 mcg, Oral, Daily, Fritzi Mandes, MD, 175 mcg at 09/10/19 0801 .  norepinephrine (LEVOPHED) 16 mg in 262mL premix infusion, 0-40 mcg/min, Intravenous, Titrated, Theresia Pree, MD, Last Rate: 23.4 mL/hr at 09/10/19 1000, 25 mcg/min at 09/10/19 1000 .  ondansetron (ZOFRAN) tablet 4 mg, 4 mg, Oral, Q6H PRN **OR** ondansetron (ZOFRAN) injection 4 mg, 4 mg, Intravenous, Q6H PRN, Fritzi Mandes, MD .  pantoprazole (PROTONIX) injection 40 mg, 40 mg, Intravenous, Q24H,  Tukov-Yual, Magdalene S, NP, 40 mg at 09/10/19 0651 .  polyethylene glycol (MIRALAX / GLYCOLAX) packet 17 g, 17 g, Oral, Daily PRN, Fritzi Mandes, MD .  prismasol BGK 2/2.5 dialysis solution, , CRRT, Continuous, Kolluru, Sarath, MD, Last Rate: 1,500 mL/hr at 09/10/19 1000, New Bag at 09/10/19 1000 .  prismasol BGK 2/2.5 replacement solution, , CRRT, Continuous, Kolluru, Sarath, MD, Last Rate: 300 mL/hr at 09/10/19 1000, New Bag at 09/10/19 1000 .  prismasol BGK 2/2.5 replacement solution, , CRRT, Continuous, Kolluru, Sarath, MD, Last Rate: 300 mL/hr at 09/10/19 1000, New Bag at 09/10/19 1000 .  senna (SENOKOT) tablet 8.6 mg, 1 tablet, Oral, BID, Fritzi Mandes, MD, 8.6 mg at 09/10/19 0801 .  sodium chloride 0.9 % primer fluid for CRRT, , CRRT, PRN, Bhutani, Manpreet S, MD .  vasopressin (PITRESSIN) 20 Units in sodium chloride 0.9 % 100 mL infusion-*FOR SHOCK*, 0-0.03 Units/min, Intravenous, Continuous, Swanson Farnell, MD, Last Rate: 9 mL/hr at 09/10/19 1000, 0.03 Units/min at 09/10/19 1000 .  Zinc Oxide (TRIPLE PASTE) 12.8 % ointment, , Topical, PRN, Ottie Glazier, MD    ALLERGIES   Penicillins    REVIEW OF SYSTEMS    Unable to obtain due to lethargy  PHYSICAL EXAMINATION   Vital Signs: Temp:  [98.7 F (37.1 C)-100.2 F (37.9 C)] 98.7 F (37.1 C) (08/30 0800) Pulse Rate:  [65-99] 65 (08/30 1000) Resp:  [14-22] 20 (08/30 1000) BP: (65-209)/(23-184) 107/55 (08/30 0900) SpO2:  [87 %-100 %] 87 % (08/30 1000) FiO2 (%):  [40 %] 40 % (08/30 0000) Weight:  [161.4 kg-162.3 kg] 162.3 kg (08/30 0500)  GENERAL:Age appropriate on BIPAP lethargic HEAD: Normocephalic, atraumatic.  EYES: Pupils equal, round, reactive to light.  No scleral icterus.  MOUTH: Moist mucosal membrane. NECK: Supple. No thyromegaly. No nodules. No JVD.  PULMONARY: decreased breath sounds bilatearlly  CARDIOVASCULAR: S1 and S2. Regular rate and rhythm. No murmurs, rubs, or gallops.  GASTROINTESTINAL: excoriated rash  with skin breakdown of anterior lowe abdominal pannus.  MUSCULOSKELETAL: No swelling, clubbing, or edema.  NEUROLOGIC:  GCS6 SKIN:intact,warm,dry   PERTINENT DATA     Infusions: . cefTRIAXone (ROCEPHIN)  IV Stopped (09/09/19 1530)  . fluconazole (DIFLUCAN) IV Stopped (09/10/19 0044)  . heparin 1,400 Units/hr (09/10/19 1000)  . norepinephrine (LEVOPHED) Adult infusion 25 mcg/min (09/10/19 1000)  . prismasol BGK  2/2.5 dialysis solution 1,500 mL/hr at 09/10/19 1000  . prismasol BGK 2/2.5 replacement solution 300 mL/hr at 09/10/19 1000  . prismasol BGK 2/2.5 replacement solution 300 mL/hr at 09/10/19 1000  . vasopressin 0.03 Units/min (09/10/19 1000)   Scheduled Medications: . azithromycin  250 mg Oral Daily  . Chlorhexidine Gluconate Cloth  6 each Topical Daily  . insulin aspart  0-15 Units Subcutaneous Q4H  . ipratropium-albuterol  3 mL Nebulization Q6H  . levothyroxine  175 mcg Oral Daily  . pantoprazole (PROTONIX) IV  40 mg Intravenous Q24H  . senna  1 tablet Oral BID   PRN Medications: acetaminophen **OR** acetaminophen, heparin, heparin sodium (porcine), ondansetron **OR** ondansetron (ZOFRAN) IV, polyethylene glycol, sodium chloride, Zinc Oxide Hemodynamic parameters:   Intake/Output: 08/29 0701 - 08/30 0700 In: 5124.6 [I.V.:4812.4; IV Piggyback:312.1] Out: 2176 [Urine:597]  Ventilator  Settings: FiO2 (%):  [40 %] 40 %   LAB RESULTS:  Basic Metabolic Panel: Recent Labs  Lab 09/08/19 1205 09/08/19 1205 09/09/19 0329 09/09/19 1004 09/09/19 1613 09/10/19 0409  NA 140  --  139  --  141 139  K 5.3*   < > 5.3*   < > 6.0* 5.2*  CL 108  --  109  --  112* 110  CO2 24  --  22  --  20* 25  GLUCOSE 88  --  86  --  115* 136*  BUN 94*  --  94*  --  95* 82*  CREATININE 4.47*  --  4.49*  --  4.44* 3.67*  CALCIUM 8.0*  --  7.7*  --  8.2* 7.9*  MG 2.6*  --  2.2  --  2.2 2.2  PHOS  --   --  7.9*  --  7.6*  --    < > = values in this interval not displayed.   Liver  Function Tests: Recent Labs  Lab 09/08/19 1205 09/09/19 0329  AST 25 48*  ALT 16 19  ALKPHOS 89 88  BILITOT 1.5* 1.3*  PROT 7.7 7.2  ALBUMIN 3.0* 2.7*   No results for input(s): LIPASE, AMYLASE in the last 168 hours. No results for input(s): AMMONIA in the last 168 hours. CBC: Recent Labs  Lab 09/08/19 1205 09/09/19 0329 09/10/19 0409  WBC 10.8* 13.0* 11.5*  NEUTROABS 8.9*  --   --   HGB 11.2* 10.7* 10.3*  HCT 37.7* 36.3* 34.1*  MCV 104.1* 104.3* 104.0*  PLT 174 186 156   Cardiac Enzymes: Recent Labs  Lab 09/09/19 0329  CKTOTAL 2,443*   BNP: Invalid input(s): POCBNP CBG: Recent Labs  Lab 09/09/19 1604 09/09/19 1913 09/09/19 2347 09/10/19 0404 09/10/19 0746  GLUCAP 99 110* 109* 117* 117*       IMAGING RESULTS:  Imaging: US RENAL  Result Date: 09/08/2019 CLINICAL DATA:  ATN EXAM: RENAL / URINARY TRACT ULTRASOUND COMPLETE COMPARISON:  None. FINDINGS: Right Kidney: Renal measurements: 10.2 x 4.8 x 5.2 cm = volume: 132 mL. Echogenicity within normal limits. No hydronephrosis. There is a cystic mass in the mid pole the right kidney measuring 1.2 cm. Left Kidney: Renal measurements: 9.1 x 4.8 x 4.3 cm = volume: 97 mL. Echogenicity within normal limits. No mass or hydronephrosis visualized. Bladder: Not visualized. Other: Exam limited by patient body habitus and inability to change position. IMPRESSION: 1. Limited exam. No definite acute finding in the bilateral kidneys. 2.  Small cyst in the midpole of the right kidney. Electronically Signed   By: Jac Canavan.D.  On: 09/08/2019 17:06   US Venous Img Lower Bilateral (DVT)  Result Date: 09/09/2019 CLINICAL DATA:  Swelling in the bilateral legs, rule out DVT. EXAM: BILATERAL LOWER EXTREMITY VENOUS DOPPLER ULTRASOUND TECHNIQUE: Gray-scale sonography with compression, as well as color and duplex ultrasound, were performed to evaluate the deep venous system(s) from the level of the common femoral vein through the  popliteal and proximal calf veins. COMPARISON:  None. FINDINGS: VENOUS Normal compressibility of the common femoral, superficial femoral, and popliteal veins, as well as the visualized calf veins. Visualized portions of profunda femoral vein and great saphenous vein unremarkable. No filling defects to suggest DVT on grayscale or color Doppler imaging. Doppler waveforms show normal direction of venous flow, normal respiratory plasticity and response to augmentation. Limited views of the contralateral common femoral vein are unremarkable. OTHER None. Limitations: none IMPRESSION: Negative for DVT in the bilateral lower extremities. Electronically Signed   By: Audie Pinto M.D.   On: 09/09/2019 14:38   DG Chest Port 1 View  Result Date: 09/09/2019 CLINICAL DATA:  Central line placement, history of CHF EXAM: PORTABLE CHEST 1 VIEW COMPARISON:  09/08/2019 FINDINGS: Left IJ double-lumen central line tip overlies the SVC. No pneumothorax. There is similar lung aeration with patchy bilateral opacities. Probable small bilateral pleural effusions. Top-normal heart size. Possible enlargement of the main pulmonary artery. IMPRESSION: Interval left IJ line placement with tip overlying SVC. No pneumothorax. Lung aeration is not substantially changed. Electronically Signed   By: Macy Mis M.D.   On: 09/09/2019 08:07   DG Chest Portable 1 View  Result Date: 09/08/2019 CLINICAL DATA:  Oxygen desaturation, hypoglycemia, shortness of breath, history COPD, CHF EXAM: PORTABLE CHEST 1 VIEW COMPARISON:  Portable exam 1224 hours without priors for comparison FINDINGS: Upper normal heart size. Mediastinal contours normal. Patchy infiltrates bilaterally greater on RIGHT, question pneumonia, asymmetric edema considered less likely. Probable small LEFT pleural effusion. No pneumothorax. IMPRESSION: BILATERAL pulmonary infiltrates favoring pneumonia. Probable small LEFT pleural effusion. Electronically Signed   By: Lavonia Dana  M.D.   On: 09/08/2019 13:06   ECHOCARDIOGRAM COMPLETE  Result Date: 09/09/2019    ECHOCARDIOGRAM REPORT   Patient Name:   KIANO TERRIEN   Date of Exam: 09/09/2019 Medical Rec #:  [id number]  Height:       68.0 in Accession #:    [id number] Weight:       330.0 lb Date of Birth:  [birth year]  BSA:          2.528 m Patient Age:    50 years   BP:           115/48 mmHg Patient Gender: M          HR:           72 bpm. Exam Location:  [facility] Procedure: 2D Echo, Cardiac Doppler and Color Doppler Indications:     Atrial Fibrillation 427.31  History:         Patient has no prior history of Echocardiogram examinations.                  CHF; Risk Factors:Hypertension and Diabetes.  Sonographer:     Sherrie Sport RDCS (AE) Referring Phys:  [place] Diagnosing Phys: Ida Rogue MD  Sonographer Comments: No apical window, no subcostal window and Technically difficult study due to poor echo windows. IMPRESSIONS  1. Left ventricular ejection fraction, by estimation, is 55 to 60%. The left ventricle has normal function. The left ventricle has  no regional wall motion abnormalities. Left ventricular diastolic parameters are indeterminate. Septal flattening in systole consistent with elevated right heart pressures.  2. Right ventricular systolic function was not well visualized. The right ventricular size is moderately enlarged. There is severely elevated pulmonary artery systolic pressure. The estimated right ventricular systolic pressure is 41.3 mmHg.  3. Left atrial size was mildly dilated. FINDINGS  Left Ventricle: Left ventricular ejection fraction, by estimation, is 55 to 60%. The left ventricle has normal function. The left ventricle has no regional wall motion abnormalities. The left ventricular internal cavity size was normal in size. There is  no left ventricular hypertrophy. Left ventricular diastolic parameters are indeterminate. Right Ventricle: The right ventricular size is moderately enlarged. No increase in right  ventricular wall thickness. Right ventricular systolic function was not well visualized. There is severely elevated pulmonary artery systolic pressure. The tricuspid  regurgitant velocity is 3.94 m/s, and with an assumed right atrial pressure of 10 mmHg, the estimated right ventricular systolic pressure is 24.4 mmHg. Left Atrium: Left atrial size was mildly dilated. Right Atrium: Right atrial size was normal in size. Pericardium: There is no evidence of pericardial effusion. Mitral Valve: The mitral valve was not well visualized. Normal mobility of the mitral valve leaflets. No evidence of mitral valve regurgitation. No evidence of mitral valve stenosis. Tricuspid Valve: The tricuspid valve is not well visualized. Tricuspid valve regurgitation is not demonstrated. No evidence of tricuspid stenosis. Aortic Valve: The aortic valve was not well visualized. Aortic valve regurgitation is not visualized. Mild aortic valve sclerosis is present, with no evidence of aortic valve stenosis. Pulmonic Valve: The pulmonic valve was not well visualized. Pulmonic valve regurgitation is not visualized. No evidence of pulmonic stenosis. Aorta: The aortic root is normal in size and structure and the aortic root was not well visualized. Venous: The inferior vena cava is normal in size with greater than 50% respiratory variability, suggesting right atrial pressure of 3 mmHg. IAS/Shunts: No atrial level shunt detected by color flow Doppler.  LEFT VENTRICLE PLAX 2D LVIDd:         5.24 cm LVIDs:         2.96 cm LV PW:         1.11 cm LV IVS:        0.96 cm LVOT diam:     2.00 cm LVOT Area:     3.14 cm  LEFT ATRIUM         Index LA diam:    4.50 cm 1.78 cm/m                        PULMONIC VALVE AORTA                 PV Vmax:        0.64 m/s Ao Root diam: 3.20 cm PV Peak grad:   1.6 mmHg                       RVOT Peak grad: 2 mmHg  TRICUSPID VALVE TR Peak grad:   62.1 mmHg TR Vmax:        394.00 cm/s  SHUNTS Systemic Diam: 2.00 cm  Ida Rogue MD Electronically signed by Ida Rogue MD Signature Date/Time: 09/09/2019/3:00:55 PM    Final    @[facility]OSP @ US Venous Img Lower Bilateral (DVT)  Result Date: 09/09/2019 CLINICAL DATA:  Swelling in the bilateral legs, rule out DVT. EXAM: BILATERAL LOWER EXTREMITY VENOUS  DOPPLER ULTRASOUND TECHNIQUE: Gray-scale sonography with compression, as well as color and duplex ultrasound, were performed to evaluate the deep venous system(s) from the level of the common femoral vein through the popliteal and proximal calf veins. COMPARISON:  None. FINDINGS: VENOUS Normal compressibility of the common femoral, superficial femoral, and popliteal veins, as well as the visualized calf veins. Visualized portions of profunda femoral vein and great saphenous vein unremarkable. No filling defects to suggest DVT on grayscale or color Doppler imaging. Doppler waveforms show normal direction of venous flow, normal respiratory plasticity and response to augmentation. Limited views of the contralateral common femoral vein are unremarkable. OTHER None. Limitations: none IMPRESSION: Negative for DVT in the bilateral lower extremities. Electronically Signed   By: Audie Pinto M.D.   On: 09/09/2019 14:38      ASSESSMENT AND PLAN     Acute Hypoxic Respiratory Failure -negative COVID -8/29 -due to most likely OHS/chronic thoracic restriction with concomitant OSA -background of CHF- repeat TTE  -CXR reviewd independently by me- cardiomegaly, mild interstitial infiltrates with platelike atelectasis bilaterally.  -empirically treating for CAP-will deescalate as appropriate -currently on BIPAP- ABG with mixed acid base disorder , have increased driving pressure and plan for repeat ABG today -reviewed care plan with Daughter today- 8/30- supplemental O2 weaned to 2L/min   Acute Renal failure- stage 5 - oliguric  - patient is on IVF - will measure response to therapy , repeat BMP in today in  4hrs -nephrology on case - plan for possible dialysis ICU telemetry monitoring   Demand ischemia  - trop hs >1500   - s/p TTE - cardiology on case - appreciate input - Dr Rockey Situ- TTE with significantly elevated right heart pressures possibly due to pulmonary htn vs VTE.    - will order [place] LE and ddimer today   - patient empirically on heparin gtt  -plan for poss CTPE once renal impairment improved.   - may be due to AKI, will trend trop     Encephalopathy     -toxic metabolic vs hypercapnic.     - correct electrolyte derrangement    -IVF rehydration      Excoriated rash of abdominal pannus -dressing per RN -wound care consultation  -empiric abx    ID -continue IV abx as prescibed -follow up cultures  GI/Nutrition GI PROPHYLAXIS as indicated DIET-->TF's as tolerated Constipation protocol as indicated  ENDO - ICU hypoglycemic\Hyperglycemia protocol -check FSBS per protocol   ELECTROLYTES -follow labs as needed -replace as needed -pharmacy consultation   DVT/GI PRX ordered -SCDs  TRANSFUSIONS AS NEEDED MONITOR FSBS ASSESS the need for LABS as needed   Critical care provider statement:    Critical care time (minutes):  34   Critical care time was exclusive of:  Separately billable procedures and treating other patients   Critical care was necessary to treat or prevent imminent or life-threatening deterioration of the following conditions:  Encephalopathy, morbid obesity, AKI, demand ischemia, mulpiple comorbid conditions   Critical care was time spent personally by me on the following activities:  Development of treatment plan with patient or surrogate, discussions with consultants, evaluation of patient's response to treatment, examination of patient, obtaining history from patient or surrogate, ordering and performing treatments and interventions, ordering and review of laboratory studies and re-evaluation of patient's condition.  I assumed direction of critical  care for this patient from another provider in my specialty: no    This document was prepared using Dragon voice recognition software and may  include unintentional dictation errors.    Ottie Glazier, M.D.  Division of [place]

## 2019-09-10 NOTE — Progress Notes (Signed)
PT Cancellation Note  Patient Details Name: Yoshiharu Brassell MRN: [id number] DOB: 1940/02/25   Cancelled Treatment:    Reason Eval/Treat Not Completed: Medical issues which prohibited therapy: Per conversation with nursing pt currently on multiple vasopressors and receiving continuous HD, not appropriate for PT evaluation at this time.  Will attempt to see pt at a future date/time as medically appropriate.     Linus Salmons PT, DPT 09/10/19, 9:08 AM

## 2019-09-11 ENCOUNTER — Inpatient Hospital Stay: Payer: Medicare Other

## 2019-09-11 DIAGNOSIS — J449 Chronic obstructive pulmonary disease, unspecified: Secondary | ICD-10-CM

## 2019-09-11 LAB — GLUCOSE, CAPILLARY
Glucose-Capillary: 131 mg/dL — ABNORMAL HIGH (ref 70–99)
Glucose-Capillary: 132 mg/dL — ABNORMAL HIGH (ref 70–99)
Glucose-Capillary: 133 mg/dL — ABNORMAL HIGH (ref 70–99)
Glucose-Capillary: 138 mg/dL — ABNORMAL HIGH (ref 70–99)
Glucose-Capillary: 147 mg/dL — ABNORMAL HIGH (ref 70–99)
Glucose-Capillary: 153 mg/dL — ABNORMAL HIGH (ref 70–99)
Glucose-Capillary: 159 mg/dL — ABNORMAL HIGH (ref 70–99)

## 2019-09-11 LAB — BASIC METABOLIC PANEL
Anion gap: 6 (ref 5–15)
BUN: 51 mg/dL — ABNORMAL HIGH (ref 8–23)
CO2: 25 mmol/L (ref 22–32)
Calcium: 8.4 mg/dL — ABNORMAL LOW (ref 8.9–10.3)
Chloride: 105 mmol/L (ref 98–111)
Creatinine, Ser: 2.05 mg/dL — ABNORMAL HIGH (ref 0.61–1.24)
GFR calc Af Amer: 35 mL/min — ABNORMAL LOW (ref >60)
GFR calc non Af Amer: 30 mL/min — ABNORMAL LOW (ref >60)
Glucose, Bld: 158 mg/dL — ABNORMAL HIGH (ref 70–99)
Potassium: 4.2 mmol/L (ref 3.5–5.1)
Sodium: 136 mmol/L (ref 135–145)

## 2019-09-11 LAB — ANTI-DNA ANTIBODY, DOUBLE-STRANDED: ds DNA Ab: 1 IU/mL (ref 0–9)

## 2019-09-11 LAB — CBC
HCT: 31.7 % — ABNORMAL LOW (ref 39.0–52.0)
Hemoglobin: 10 g/dL — ABNORMAL LOW (ref 13.0–17.0)
MCH: 31.3 pg (ref 26.0–34.0)
MCHC: 31.5 g/dL (ref 30.0–36.0)
MCV: 99.4 fL (ref 80.0–100.0)
Platelets: 137 10*3/uL — ABNORMAL LOW (ref 150–400)
RBC: 3.19 MIL/uL — ABNORMAL LOW (ref 4.22–5.81)
RDW: 17.5 % — ABNORMAL HIGH (ref 11.5–15.5)
WBC: 11.4 10*3/uL — ABNORMAL HIGH (ref 4.0–10.5)
nRBC: 0 % (ref 0.0–0.2)

## 2019-09-11 LAB — CULTURE, BLOOD (ROUTINE X 2)

## 2019-09-11 LAB — C4 COMPLEMENT: Complement C4, Body Fluid: 20 mg/dL (ref 12–38)

## 2019-09-11 LAB — HEPARIN LEVEL (UNFRACTIONATED)
Heparin Unfractionated: 0.28 IU/mL — ABNORMAL LOW (ref 0.30–0.70)
Heparin Unfractionated: 0.3 IU/mL (ref 0.30–0.70)
Heparin Unfractionated: 0.59 IU/mL (ref 0.30–0.70)

## 2019-09-11 LAB — COMPLEMENT, TOTAL: Compl, Total (CH50): >60 U/mL (ref >41)

## 2019-09-11 LAB — MPO/PR-3 (ANCA) ANTIBODIES
ANCA Proteinase 3: 4.6 U/mL — ABNORMAL HIGH (ref 0.0–3.5)
Myeloperoxidase Abs: <9 U/mL (ref 0.0–9.0)

## 2019-09-11 LAB — PROCALCITONIN: Procalcitonin: 0.47 ng/mL

## 2019-09-11 LAB — ANTINUCLEAR ANTIBODIES, IFA: ANA Ab, IFA: NEGATIVE

## 2019-09-11 LAB — PHOSPHORUS: Phosphorus: 3.8 mg/dL (ref 2.5–4.6)

## 2019-09-11 LAB — MAGNESIUM: Magnesium: 2 mg/dL (ref 1.7–2.4)

## 2019-09-11 LAB — C3 COMPLEMENT: C3 Complement: 97 mg/dL (ref 82–167)

## 2019-09-11 MED ORDER — CHLORHEXIDINE GLUCONATE CLOTH 2 % EX PADS
6.0000 | MEDICATED_PAD | Freq: Every day | CUTANEOUS | Status: DC
  Administered 2019-09-12 – 2019-09-27 (×16): 6 via TOPICAL

## 2019-09-11 NOTE — Consult Note (Signed)
[facility] for heparin dosing  Indication: chest pain/ACS  Allergies  Allergen Reactions  . Penicillins Rash    Patient Measurements: Height: 5\' 8"  (172.7 cm) Weight: (!) 163 kg (359 lb 5.6 oz) IBW/kg (Calculated) : 68.4 Heparin Dosing Weight: 104.8  Vital Signs: Temp: 98.3 F (36.8 C) (08/31 0400) Temp Source: Axillary (08/31 0400) BP: 146/78 (08/31 0500) Pulse Rate: 70 (08/31 0500)  Labs: Recent Labs    09/08/19 1205 09/08/19 1206 09/08/19 1607 09/09/19 0042 09/09/19 0329 09/09/19 0329 09/09/19 0607 09/09/19 1004 09/09/19 1218 09/09/19 1613 09/10/19 0409 09/11/19 0433  HGB   < >  --   --   --  10.7*   < >  --   --   --   --  10.3* 10.0*  HCT   < >  --   --   --  36.3*  --   --   --   --   --  34.1* 31.7*  PLT   < >  --   --   --  186  --   --   --   --   --  156 137*  APTT  --  34  --   --   --   --   --   --   --   --   --   --   LABPROT  --  14.9  --   --   --   --   --   --   --   --   --   --   INR  --  1.2  --   --   --   --   --   --   --   --   --   --   HEPARINUNFRC  --   --   --    < >  --   --   --   --  0.37  --  0.35 0.28*  CREATININE   < >  --   --   --  4.49*   < >  --   --   --  4.44* 3.67* 2.05*  CKTOTAL  --   --   --   --  2,443*  --   --   --   --   --   --   --   TROPONINIHS   < >  --  445*  --   --   --  1,541* 2,333*  --   --   --   --    < > = values in this interval not displayed.    Estimated Creatinine Clearance: 43.9 mL/min (A) (by C-G formula based on SCr of 2.05 mg/dL (H)).   Medical History: Past Medical History:  Diagnosis Date  . Congestive heart failure (CHF) ([place])   . Hypertension   . Hypothyroidism   . Morbid obesity ([place])   . Obesity hypoventilation syndrome ([place])   . Type 2 diabetes mellitus ([facility])     Medications:  Scheduled:  . azithromycin  250 mg Oral Daily  . Chlorhexidine Gluconate Cloth  6 each Topical Daily  . hydrocortisone sod succinate (SOLU-CORTEF) inj  100 mg  Intravenous Q12H  . insulin aspart  0-15 Units Subcutaneous Q4H  . ipratropium-albuterol  3 mL Nebulization Q6H  . levothyroxine  175 mcg Oral Daily  . mouth rinse  15 mL Mouth Rinse BID  . midodrine  10  mg Oral TID WC  . pantoprazole (PROTONIX) IV  40 mg Intravenous Q24H  . senna  1 tablet Oral BID  . zinc oxide  1 application Topical Daily    Assessment: 79 year old male presenting to the ED with AMS, hypoglycemia, and hypotension. Troponins 181, hgb 11.2, plt 174.  8/29 0042 HL 0.51  8/29 1218 HL 0.37  8/31 0433 HL 0.28, barely subtherapeutic  Goal of Therapy:  Heparin level 0.3-0.7 units/ml Monitor platelets by anticoagulation protocol: Yes   Plan:  08/31 @ 0433 HL 0.28 subtherapeutic. CBC slightly worse.  Will increase rate to 1600 units/hr and will recheck HL in 8 hours.   Check CBC daily per protocol  Hart Robinsons, PharmD Clinical Pharmacist  09/11/2019 5:49 AM

## 2019-09-11 NOTE — Progress Notes (Signed)
PT Cancellation Note  Patient Details Name: Andrew Valentine MRN: [id number] DOB: [birth year]   Cancelled Treatment:    Reason Eval/Treat Not Completed: Medical issues which prohibited therapy: Per RN, pt remains on CRRT at this time, will f/u for PT evaluation as able/ appropriate.   Linus Salmons PT, DPT 09/11/19, 1:08 PM

## 2019-09-11 NOTE — Progress Notes (Signed)
Progress Note  Patient Name: Andrew Valentine Date of Encounter: 09/11/2019  [facility] Cardiologist: on [facility]  Subjective   On BiPAP, lethargic CRRT, weaning nor epi, Off vasopressin   Inpatient Medications    Scheduled Meds: . azithromycin  250 mg Oral Daily  . Chlorhexidine Gluconate Cloth  6 each Topical Daily  . hydrocortisone sod succinate (SOLU-CORTEF) inj  100 mg Intravenous Q12H  . insulin aspart  0-15 Units Subcutaneous Q4H  . ipratropium-albuterol  3 mL Nebulization Q6H  . levothyroxine  175 mcg Oral Daily  . mouth rinse  15 mL Mouth Rinse BID  . midodrine  10 mg Oral TID WC  . pantoprazole (PROTONIX) IV  40 mg Intravenous Q24H  . senna  1 tablet Oral BID  . zinc oxide  1 application Topical Daily   Continuous Infusions: . cefTRIAXone (ROCEPHIN)  IV Stopped (09/10/19 1440)  . fluconazole (DIFLUCAN) IV Stopped (09/10/19 2337)  . heparin 1,600 Units/hr (09/11/19 1300)  . norepinephrine (LEVOPHED) Adult infusion 9 mcg/min (09/11/19 1300)  . prismasol BGK 2/2.5 dialysis solution 1,500 mL/hr at 09/11/19 1305  . prismasol BGK 2/2.5 replacement solution 300 mL/hr at 09/11/19 0254  . prismasol BGK 2/2.5 replacement solution 300 mL/hr at 09/11/19 0254  . vasopressin Stopped (09/11/19 0700)   PRN Meds: acetaminophen **OR** acetaminophen, heparin, heparin sodium (porcine), ondansetron **OR** ondansetron (ZOFRAN) IV, polyethylene glycol, sodium chloride   Vital Signs    Vitals:   09/11/19 1000 09/11/19 1100 09/11/19 1200 09/11/19 1300  BP: (!) 106/52 (!) 117/51 131/63 (!) 114/48  Pulse: 81 80 61 68  Resp: 17 14 16 15   Temp:   98.7 F (37.1 C)   TempSrc:   Axillary   SpO2: 93% 100% 100% 99%  Weight:      Height:        Intake/Output Summary (Last 24 hours) at 09/11/2019 1340 Last data filed at 09/11/2019 1300 Gross per 24 hour  Intake 1364.11 ml  Output 1316 ml  Net 48.11 ml   Last 3 Weights 09/11/2019 09/10/2019 09/09/2019  Weight (lbs) 359 lb 5.6 oz 357 lb  12.9 oz 355 lb 13.2 oz  Weight (kg) 163 kg 162.3 kg 161.4 kg      Telemetry    Atrial fibrillation rate 70- Personally Reviewed  ECG    - Personally Reviewed  Physical Exam   GEN:  Lethargic, on BiPAP Neck:  Unable to estimate JVD Cardiac:  Irregularly irregular no murmurs, rubs, or gallops.  Respiratory: Clear to auscultation bilaterally. GI: Soft,  non-distended  MS: No edema; No deformity. Neuro:  Unable to test Psych: Sleeping  Labs    High Sensitivity Troponin:   Recent Labs  Lab 09/08/19 1205 09/08/19 1607 09/09/19 0607 09/09/19 1004  TROPONINIHS 181* 445* 1,541* 2,333*      Chemistry Recent Labs  Lab 09/08/19 1205 09/08/19 1205 09/09/19 0329 09/09/19 1004 09/09/19 1613 09/10/19 0409 09/11/19 0433  NA 140   < > 139  --  141 139 136  K 5.3*   < > 5.3*   < > 6.0* 5.2* 4.2  CL 108   < > 109  --  112* 110 105  CO2 24   < > 22  --  20* 25 25  GLUCOSE 88   < > 86  --  115* 136* 158*  BUN 94*   < > 94*  --  95* 82* 51*  CREATININE 4.47*   < > 4.49*  --  4.44* 3.67* 2.05*  CALCIUM 8.0*   < > 7.7*  --  8.2* 7.9* 8.4*  PROT 7.7  --  7.2  --   --   --   --   ALBUMIN 3.0*  --  2.7*  --   --   --   --   AST 25  --  48*  --   --   --   --   ALT 16  --  19  --   --   --   --   ALKPHOS 89  --  88  --   --   --   --   BILITOT 1.5*  --  1.3*  --   --   --   --   GFRNONAA 12*   < > 12*  --  12* 15* 30*  GFRAA 14*   < > 13*  --  14* 17* 35*  ANIONGAP 8   < > 8  --  9 4* 6   < > = values in this interval not displayed.     Hematology Recent Labs  Lab 09/09/19 0329 09/10/19 0409 09/11/19 0433  WBC 13.0* 11.5* 11.4*  RBC 3.48* 3.28* 3.19*  HGB 10.7* 10.3* 10.0*  HCT 36.3* 34.1* 31.7*  MCV 104.3* 104.0* 99.4  MCH 30.7 31.4 31.3  MCHC 29.5* 30.2 31.5  RDW 17.8* 17.7* 17.5*  PLT 186 156 137*    BNP Recent Labs  Lab 09/08/19 1205  BNP 280.3*     DDimer No results for input(s): DDIMER in the last 168 hours.   Radiology    US Venous Img Lower  Bilateral (DVT)  Result Date: 09/09/2019 CLINICAL DATA:  Swelling in the bilateral legs, rule out DVT. EXAM: BILATERAL LOWER EXTREMITY VENOUS DOPPLER ULTRASOUND TECHNIQUE: Gray-scale sonography with compression, as well as color and duplex ultrasound, were performed to evaluate the deep venous system(s) from the level of the common femoral vein through the popliteal and proximal calf veins. COMPARISON:  None. FINDINGS: VENOUS Normal compressibility of the common femoral, superficial femoral, and popliteal veins, as well as the visualized calf veins. Visualized portions of profunda femoral vein and great saphenous vein unremarkable. No filling defects to suggest DVT on grayscale or color Doppler imaging. Doppler waveforms show normal direction of venous flow, normal respiratory plasticity and response to augmentation. Limited views of the contralateral common femoral vein are unremarkable. OTHER None. Limitations: none IMPRESSION: Negative for DVT in the bilateral lower extremities. Electronically Signed   By: Audie Pinto M.D.   On: 09/09/2019 14:38    Cardiac Studies   Echo 1. Left ventricular ejection fraction, by estimation, is 55 to 60%. The  left ventricle has normal function. The left ventricle has no regional  wall motion abnormalities. Left ventricular diastolic parameters are  indeterminate. Septal flattening in  systole consistent with elevated right heart pressures.  2. Right ventricular systolic function was not well visualized. The right  ventricular size is moderately enlarged. There is severely elevated  pulmonary artery systolic pressure. The estimated right ventricular  systolic pressure is 92.4 mmHg.  3. Left atrial size was mildly dilated.    Patient Profile     79 y.o. male   Assessment & Plan    Atrial fibrillation Timing of onset unclear, rate relatively well controlled On heparin No plan to restore normal sinus rhythm at this time  Acute respiratory  distress Obesity hypoventilation, obstructive sleep apnea, pulmonary hypertension.  Unable to exclude chronic PEs but not a good candidate  for cardiac CTA (he is on heparin) Presenting with encephalopathy, hypercarbia --Improving with BiPAP  Pulmonary hypertension Likely secondary to chronic poorly controlled sleep apnea, obesity hypoventilation, --We will need strict BiPAP when sleeping and napping as outpatient --- Once renal function improves, will need gentle diuresis given markedly elevated right heart pressures (suspect chronic)  Anemia Close monitoring, consider transfusion Followed by nephrology,    Renal failure,  stage IIIb By report baseline creatinine 1.5 Presenting with creatinine greater than 4 Nephrology following  Critically ill, case discussed with ICU team  Total encounter time more than 35 minutes  Greater than 50% was spent in counseling and coordination of care with the patient   For questions or updates, please contact [facility] Please consult [email or web address] for contact info under        Signed, Ida Rogue, MD  09/11/2019, 1:40 PM

## 2019-09-11 NOTE — Progress Notes (Signed)
[facility]  ROUNDING NOTE   Subjective:   On CRRT. Requiring norepinephrine. Weaned off vasopressin. UOP 452. UF 1296. Net -83.23mL  Afebrile  Objective:  Vital signs in last 24 hours:  Temp:  [97.6 F (36.4 C)-98.8 F (37.1 C)] 98.7 F (37.1 C) (08/31 1200) Pulse Rate:  [61-90] 68 (08/31 1300) Resp:  [12-19] 15 (08/31 1300) BP: (106-146)/(48-90) 114/48 (08/31 1300) SpO2:  [92 %-100 %] 99 % (08/31 1300) FiO2 (%):  [40 %] 40 % (08/31 0400) Weight:  [163 kg] 163 kg (08/31 0451)  Weight change: 1.6 kg Filed Weights   09/09/19 2000 09/10/19 0500 09/11/19 0451  Weight: (!) 161.4 kg (!) 162.3 kg (!) 163 kg    Intake/Output: I/O last 3 completed shifts: In: 3929.2 [P.O.:20; I.V.:3377; Other:20; IV Piggyback:512.2] Out: 7412 [INOMV:672; Other:2875]   Intake/Output this shift:  Total I/O In: 293.1 [P.O.:120; I.V.:143.1; Other:30] Out: 36 [Urine:73; Other:219]  Physical Exam: General: Critically ill  Head: Normocephalic, atraumatic. Moist oral mucosal membranes  Eyes: Anicteric, PERRL  Neck: Supple, trachea midline, obese  Lungs:  Diminished bilaterally 2L [place] O2  Heart: Regular rate and rhythm  Abdomen:  Soft, nontender, obese  Extremities: ++ peripheral edema.  Neurologic: Nonfocal, moving all four extremities, answering questions intermittently.   Skin: No lesions  Access: LIJ temp HD catheter 8/29 ICU    Basic Metabolic Panel: Recent Labs  Lab 09/08/19 1205 09/08/19 1205 09/09/19 0329 09/09/19 0329 09/09/19 1004 09/09/19 1613 09/10/19 0409 09/11/19 0433  NA 140  --  139  --   --  141 139 136  K 5.3*   < > 5.3*  --  5.7* 6.0* 5.2* 4.2  CL 108  --  109  --   --  112* 110 105  CO2 24  --  22  --   --  20* 25 25  GLUCOSE 88  --  86  --   --  115* 136* 158*  BUN 94*  --  94*  --   --  95* 82* 51*  CREATININE 4.47*  --  4.49*  --   --  4.44* 3.67* 2.05*  CALCIUM 8.0*   < > 7.7*   < >  --  8.2* 7.9* 8.4*  MG 2.6*  --  2.2  --   --  2.2 2.2 2.0   PHOS  --   --  7.9*  --   --  7.6*  --  3.8   < > = values in this interval not displayed.    Liver Function Tests: Recent Labs  Lab 09/08/19 1205 09/09/19 0329  AST 25 48*  ALT 16 19  ALKPHOS 89 88  BILITOT 1.5* 1.3*  PROT 7.7 7.2  ALBUMIN 3.0* 2.7*   No results for input(s): LIPASE, AMYLASE in the last 168 hours. No results for input(s): AMMONIA in the last 168 hours.  CBC: Recent Labs  Lab 09/08/19 1205 09/09/19 0329 09/10/19 0409 09/11/19 0433  WBC 10.8* 13.0* 11.5* 11.4*  NEUTROABS 8.9*  --   --   --   HGB 11.2* 10.7* 10.3* 10.0*  HCT 37.7* 36.3* 34.1* 31.7*  MCV 104.1* 104.3* 104.0* 99.4  PLT 174 186 156 137*    Cardiac Enzymes: Recent Labs  Lab 09/09/19 0329  CKTOTAL 2,443*    BNP: Invalid input(s): POCBNP  CBG: Recent Labs  Lab 09/10/19 1938 09/11/19 0010 09/11/19 0318 09/11/19 0727 09/11/19 1117  GLUCAP 139* 153* 138* 133* 132*    Microbiology: Results for  orders placed or performed during the hospital encounter of 09/08/19  MRSA PCR Screening     Status: None   Collection Time: 09/08/19 11:49 AM   Specimen: Nasopharyngeal  Result Value Ref Range Status   MRSA by PCR NEGATIVE NEGATIVE Final    Comment:        The GeneXpert MRSA Assay (FDA approved for NASAL specimens only), is one component of a comprehensive MRSA colonization surveillance program. It is not intended to diagnose MRSA infection nor to guide or monitor treatment for MRSA infections. Performed at [facility], [address] [place], [place] [address]   SARS Coronavirus 2 by RT PCR (hospital order, performed in [facility] hospital lab) Nasopharyngeal Nasopharyngeal Swab     Status: None   Collection Time: 09/08/19 12:05 PM   Specimen: Nasopharyngeal Swab  Result Value Ref Range Status   SARS Coronavirus 2 NEGATIVE NEGATIVE Final    Comment: (NOTE) SARS-CoV-2 target nucleic acids are NOT DETECTED.  The SARS-CoV-2 RNA is generally detectable in upper  and lower respiratory specimens during the acute phase of infection. The lowest concentration of SARS-CoV-2 viral copies this assay can detect is 250 copies / mL. A negative result does not preclude SARS-CoV-2 infection and should not be used as the sole basis for treatment or other patient management decisions.  A negative result may occur with improper specimen collection / handling, submission of specimen other than nasopharyngeal swab, presence of viral mutation(s) within the areas targeted by this assay, and inadequate number of viral copies (<250 copies / mL). A negative result must be combined with clinical observations, patient history, and epidemiological information.  Fact Sheet for Patients:   StrictlyIdeas.no  Fact Sheet for Healthcare Providers: BankingDealers.co.za  This test is not yet approved or  cleared by the [place] FDA and has been authorized for detection and/or diagnosis of SARS-CoV-2 by FDA under an Emergency Use Authorization (EUA).  This EUA will remain in effect (meaning this test can be used) for the duration of the COVID-19 declaration under Section 564(b)(1) of the Act, 21 U.S.C. section 360bbb-3(b)(1), unless the authorization is terminated or revoked sooner.  Performed at [facility], [address] [place], [place] [address]   Blood culture (routine x 2)     Status: Abnormal   Collection Time: 09/08/19 12:07 PM   Specimen: BLOOD  Result Value Ref Range Status   Specimen Description   Final    BLOOD L DISTAL FOREARM Performed at [facility], [address] [place], [place] [address]    Special Requests   Final    BOTTLES DRAWN AEROBIC AND ANAEROBIC Blood Culture results may not be optimal due to an excessive volume of blood received in culture bottles Performed at [facility], [address], [place], [place] [address]    Culture  Setup Time   Final    GRAM  POSITIVE COCCI AEROBIC BOTTLE ONLY CRITICAL RESULT CALLED TO, READ BACK BY AND VERIFIED WITH: MORGAN CUNNINGHAM  AT 2778 ON 09/09/19 SNG    Culture (A)  Final    STAPHYLOCOCCUS EPIDERMIDIS THE SIGNIFICANCE OF ISOLATING THIS ORGANISM FROM A SINGLE SET OF BLOOD CULTURES WHEN MULTIPLE SETS ARE DRAWN IS UNCERTAIN. PLEASE NOTIFY THE MICROBIOLOGY DEPARTMENT WITHIN ONE WEEK IF SPECIATION AND SENSITIVITIES ARE REQUIRED. Performed at [facility], [place] [address] [place], [place] [address]    Report Status 09/11/2019 FINAL  Final  Blood Culture ID Panel (Reflexed)     Status:  Abnormal   Collection Time: 09/08/19 12:07 PM  Result Value Ref Range Status   Enterococcus faecalis NOT DETECTED NOT DETECTED Final   Enterococcus Faecium NOT DETECTED NOT DETECTED Final   Listeria monocytogenes NOT DETECTED NOT DETECTED Final   Staphylococcus species DETECTED (A) NOT DETECTED Final    Comment: CRITICAL RESULT CALLED TO, READ BACK BY AND VERIFIED WITH: MORGAN CUNNINGHAM AT 1345 ON 09/09/19 SNG    Staphylococcus aureus (BCID) NOT DETECTED NOT DETECTED Final   Staphylococcus epidermidis DETECTED (A) NOT DETECTED Final    Comment: CRITICAL RESULT CALLED TO, READ BACK BY AND VERIFIED WITH: MORGAN CUNNINGHAM AT 1345 ON 09/09/19 SNG    Staphylococcus lugdunensis NOT DETECTED NOT DETECTED Final   Streptococcus species NOT DETECTED NOT DETECTED Final   Streptococcus agalactiae NOT DETECTED NOT DETECTED Final   Streptococcus pneumoniae NOT DETECTED NOT DETECTED Final   Streptococcus pyogenes NOT DETECTED NOT DETECTED Final   A.calcoaceticus-baumannii NOT DETECTED NOT DETECTED Final   Bacteroides fragilis NOT DETECTED NOT DETECTED Final   Enterobacterales NOT DETECTED NOT DETECTED Final   Enterobacter cloacae complex NOT DETECTED NOT DETECTED Final   Escherichia coli NOT DETECTED NOT DETECTED Final   Klebsiella aerogenes NOT DETECTED NOT DETECTED Final   Klebsiella oxytoca NOT DETECTED NOT DETECTED Final    Klebsiella pneumoniae NOT DETECTED NOT DETECTED Final   Proteus species NOT DETECTED NOT DETECTED Final   Salmonella species NOT DETECTED NOT DETECTED Final   Serratia marcescens NOT DETECTED NOT DETECTED Final   Haemophilus influenzae NOT DETECTED NOT DETECTED Final   Neisseria meningitidis NOT DETECTED NOT DETECTED Final   Pseudomonas aeruginosa NOT DETECTED NOT DETECTED Final   Stenotrophomonas maltophilia NOT DETECTED NOT DETECTED Final   Candida albicans NOT DETECTED NOT DETECTED Final   Candida auris NOT DETECTED NOT DETECTED Final   Candida glabrata NOT DETECTED NOT DETECTED Final   Candida krusei NOT DETECTED NOT DETECTED Final   Candida parapsilosis NOT DETECTED NOT DETECTED Final   Candida tropicalis NOT DETECTED NOT DETECTED Final   Cryptococcus neoformans/gattii NOT DETECTED NOT DETECTED Final   Methicillin resistance mecA/C NOT DETECTED NOT DETECTED Final    Comment: Performed at [facility], [address] [place], [place] [address]  Blood culture (routine x 2)     Status: None (Preliminary result)   Collection Time: 09/08/19 12:59 PM   Specimen: BLOOD  Result Value Ref Range Status   Specimen Description BLOOD LEFT UPPER FORARM  Final   Special Requests   Final    BOTTLES DRAWN AEROBIC AND ANAEROBIC Blood Culture results may not be optimal due to an excessive volume of blood received in culture bottles   Culture   Final    NO GROWTH 3 DAYS Performed at [facility], [address] [place], [place] [address]    Report Status PENDING  Incomplete    Coagulation Studies: No results for input(s): LABPROT, INR in the last 72 hours.  Urinalysis: No results for input(s): COLORURINE, LABSPEC, PHURINE, GLUCOSEU, HGBUR, BILIRUBINUR, KETONESUR, PROTEINUR, UROBILINOGEN, NITRITE, LEUKOCYTESUR in the last 72 hours.  Invalid input(s): APPERANCEUR    Imaging: US Venous Img Lower Bilateral (DVT)  Result Date: 09/09/2019 CLINICAL DATA:  Swelling in the  bilateral legs, rule out DVT. EXAM: BILATERAL LOWER EXTREMITY VENOUS DOPPLER ULTRASOUND TECHNIQUE: Gray-scale sonography with compression, as well as color and duplex ultrasound, were performed to evaluate the deep venous system(s) from the level of the common femoral vein through the popliteal and proximal calf veins. COMPARISON:  None. FINDINGS: VENOUS Normal compressibility of the common femoral, superficial femoral, and popliteal veins, as well as the visualized calf veins. Visualized portions of profunda femoral vein and great saphenous vein unremarkable. No filling defects to suggest DVT on grayscale or color Doppler imaging. Doppler waveforms show normal direction of venous flow, normal respiratory plasticity and response to augmentation. Limited views of the contralateral common femoral vein are unremarkable. OTHER None. Limitations: none IMPRESSION: Negative for DVT in the bilateral lower extremities. Electronically Signed   By: Audie Pinto M.D.   On: 09/09/2019 14:38     Medications:   . cefTRIAXone (ROCEPHIN)  IV Stopped (09/10/19 1440)  . fluconazole (DIFLUCAN) IV Stopped (09/10/19 2337)  . heparin 1,600 Units/hr (09/11/19 1300)  . norepinephrine (LEVOPHED) Adult infusion 9 mcg/min (09/11/19 1300)  . prismasol BGK 2/2.5 dialysis solution 1,500 mL/hr at 09/11/19 1305  . prismasol BGK 2/2.5 replacement solution 300 mL/hr at 09/11/19 0254  . prismasol BGK 2/2.5 replacement solution 300 mL/hr at 09/11/19 0254  . vasopressin Stopped (09/11/19 0700)   . azithromycin  250 mg Oral Daily  . Chlorhexidine Gluconate Cloth  6 each Topical Daily  . hydrocortisone sod succinate (SOLU-CORTEF) inj  100 mg Intravenous Q12H  . insulin aspart  0-15 Units Subcutaneous Q4H  . ipratropium-albuterol  3 mL Nebulization Q6H  . levothyroxine  175 mcg Oral Daily  . mouth rinse  15 mL Mouth Rinse BID  . midodrine  10 mg Oral TID WC  . pantoprazole (PROTONIX) IV  40 mg Intravenous Q24H  . senna  1  tablet Oral BID  . zinc oxide  1 application Topical Daily   acetaminophen **OR** acetaminophen, heparin, heparin sodium (porcine), ondansetron **OR** ondansetron (ZOFRAN) IV, polyethylene glycol, sodium chloride  Assessment/ Plan:  Mr. Andrew Valentine is a 79 y.o. 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 Acute renal failure with hyperkalemia: on chronic kidney disease stage IIIB. Creatinine baseline reported to be 1.5 by son.  Nonoliguric urine output.  - requiring renal replacement therapy. Will monitor, if more stable, will transition to intermittent hemodialysis treatment.   2. Hypotension: requiring vasopressors. With cardiogenic and possible septic shock from pneumonia - Appreciate cardiology input.  - empiric antibiotics.   3. Acute respiratory failure with respiratory acidosis: with acute exacerbation of chronic diastolic congestive heart failure  - Appreciate pulmonary input.    LOS: 3 Andrew Valentine 8/31/20211:31 PM

## 2019-09-11 NOTE — Progress Notes (Signed)
Assisted tele visit to patient with daughter.  Thomas, Kinte Trim Renee, RN   

## 2019-09-11 NOTE — Progress Notes (Signed)
Pt fully awake and alert, ate 50% of dinner fed by this Probation officer. Notable difficulty drinking thin liquids. Took only 1 po drink, then refused. May need to be evaluated for thickened liquids. CN notified.

## 2019-09-11 NOTE — Progress Notes (Signed)
Assisted tele visit to patient with son.  Thomas, Elliona Doddridge Renee, RN   

## 2019-09-11 NOTE — Progress Notes (Signed)
Pt was alert and oriented X4 this am. Verbally responsive and following commands. Mood was jovial, pleasant and cooperative. He ate aprox. 50%of his breakfast. He had to be fed, could not lift the fork past his chest. Diet was pureed with coughing noted when he drank liquids. Began to get sleepy around 11:30am. Bi-pap placed while asleep. Deep sleep, unable to arouse for lunch and scheduled po meds. He does respond to tactle stimuli by moving/ withdrawing from pain. Is not opening his eyes or talking.

## 2019-09-11 NOTE — Progress Notes (Addendum)
OT Cancellation Note  Patient Details Name: Andrew Valentine MRN: [id number] DOB: 11/17/1940   Cancelled Treatment:    Reason Eval/Treat Not Completed: Medical issues which prohibited therapy  Per RN, pt still currently on CRRT at this time, will f/u for OT evaluation as able/as appropriate. Thank you.  Gerrianne Scale, [place], OTR/L ascom [phone] 09/11/19, 11:47 AM

## 2019-09-11 NOTE — Consult Note (Signed)
ANTICOAGULATION CONSULT NOTE   Pharmacy Consult for heparin dosing  Indication: chest pain/ACS  Patient Measurements: Height: 5\' 8"  (172.7 cm) Weight: (!) 163 kg (359 lb 5.6 oz) IBW/kg (Calculated) : 68.4 Heparin Dosing Weight: 104.8  Vital Signs: Temp: 98.3 F (36.8 C) (08/31 0400) Temp Source: Axillary (08/31 0400) BP: 131/62 (08/31 0700) Pulse Rate: 68 (08/31 0700)  Labs: Recent Labs    09/08/19 1205 09/08/19 1206 09/08/19 1607 09/09/19 0042 09/09/19 0329 09/09/19 0329 09/09/19 0607 09/09/19 1004 09/09/19 1218 09/09/19 1613 09/10/19 0409 09/11/19 0433  HGB   < >  --   --   --  10.7*   < >  --   --   --   --  10.3* 10.0*  HCT   < >  --   --   --  36.3*  --   --   --   --   --  34.1* 31.7*  PLT   < >  --   --   --  186  --   --   --   --   --  156 137*  APTT  --  34  --   --   --   --   --   --   --   --   --   --   LABPROT  --  14.9  --   --   --   --   --   --   --   --   --   --   INR  --  1.2  --   --   --   --   --   --   --   --   --   --   HEPARINUNFRC  --   --   --    < >  --   --   --   --  0.37  --  0.35 0.28*  CREATININE   < >  --   --   --  4.49*   < >  --   --   --  4.44* 3.67* 2.05*  CKTOTAL  --   --   --   --  2,443*  --   --   --   --   --   --   --   TROPONINIHS   < >  --  445*  --   --   --  1,541* 2,333*  --   --   --   --    < > = values in this interval not displayed.    Estimated Creatinine Clearance: 43.9 mL/min (A) (by C-G formula based on SCr of 2.05 mg/dL (H)).   Medical History: Past Medical History:  Diagnosis Date  . Congestive heart failure (CHF) ([place])   . Hypertension   . Hypothyroidism   . Morbid obesity ([place])   . Obesity hypoventilation syndrome ([place])   . Type 2 diabetes mellitus (HCC)     Medications:  Scheduled:  . azithromycin  250 mg Oral Daily  . Chlorhexidine Gluconate Cloth  6 each Topical Daily  . hydrocortisone sod succinate (SOLU-CORTEF) inj  100 mg Intravenous Q12H  . insulin aspart  0-15 Units Subcutaneous  Q4H  . ipratropium-albuterol  3 mL Nebulization Q6H  . levothyroxine  175 mcg Oral Daily  . mouth rinse  15 mL Mouth Rinse BID  . midodrine  10 mg Oral TID WC  . pantoprazole (PROTONIX) IV  40  mg Intravenous Q24H  . senna  1 tablet Oral BID  . zinc oxide  1 application Topical Daily    Assessment: 79 year old male presenting to the ED with AMS, hypoglycemia, and hypotension started on a heparin infusion for atrial fibrillation (onset unclear) being followed by cardiology. H&H, platelets have been trending down since admission  Goal of Therapy:  Heparin level 0.3-0.7 units/ml Monitor platelets by anticoagulation protocol: Yes   Plan:   Anti-Xa level therapeutic but borderline:  Increase heparin infusion rate to 1800 units/hr  Re-check anti-Xa level 6 hours after rate change  Check CBC daily while on heparin infusion per protocol  Vallery Sa, PharmD Clinical Pharmacist  09/11/2019 7:18 AM

## 2019-09-11 NOTE — Progress Notes (Signed)
CRITICAL CARE PROGRESS NOTE    Name: Andrew Valentine MRN: [id number] DOB: 07/13/1940     LOS: 3   SUBJECTIVE FINDINGS & SIGNIFICANT EVENTS    Patient description:  61M hx of Morbid obesity BMI >50, CHF, DM, hypothoroidism, came from home due to dehydration and AKI.  He lives alone and cannot take care of himself. He was noted to be lethargic in ED with hypercapnia and hypoxemia.  I spoke to daughter she states she came to check on him and blood glucose was 47, he was unable to get out of chair. He was weak could not walk, desaturating 70s.  He was encephalopathic at this time. Daughter lives next door.    09/10/19- patient is awake this am, he on levphed 25, hes on CRRT with dyasylate modifcation this am by renal team. S/p wound care eval, UOP has improved.   8/31- patient weaned off vasopressin, weaned levophed from 25>>10.    Lines/tubes : Urethral Catheter Maureen RN Straight-tip 16 Fr. (Active)  Indication for Insertion or Continuance of Catheter Unstable critically ill patients first 24-48 hours (See Criteria) 09/09/19 0729  Site Assessment Clean;Intact 09/09/19 0729  Catheter Maintenance Bag below level of bladder;Drainage bag/tubing not touching floor;No dependent loops;Seal intact 09/09/19 0729  Collection Container Standard drainage bag 09/09/19 0729  Securement Method Securing device (Describe) 09/09/19 0729  Urinary Catheter Interventions (if applicable) Unclamped 86/76/72 0729  Output (mL) 40 mL 09/09/19 0400    Microbiology/Sepsis markers: Results for orders placed or performed during the hospital encounter of 09/08/19  MRSA PCR Screening     Status: None   Collection Time: 09/08/19 11:49 AM   Specimen: Nasopharyngeal  Result Value Ref Range Status   MRSA by PCR NEGATIVE NEGATIVE Final    Comment:         The GeneXpert MRSA Assay (FDA approved for NASAL specimens only), is one component of a comprehensive MRSA colonization surveillance program. It is not intended to diagnose MRSA infection nor to guide or monitor treatment for MRSA infections. Performed at [facility], [address] [place], [place] [address]   SARS Coronavirus 2 by RT PCR (hospital order, performed in [facility] hospital lab) Nasopharyngeal Nasopharyngeal Swab     Status: None   Collection Time: 09/08/19 12:05 PM   Specimen: Nasopharyngeal Swab  Result Value Ref Range Status   SARS Coronavirus 2 NEGATIVE NEGATIVE Final    Comment: (NOTE) SARS-CoV-2 target nucleic acids are NOT DETECTED.  The SARS-CoV-2 RNA is generally detectable in upper and lower respiratory specimens during the acute phase of infection. The lowest concentration of SARS-CoV-2 viral copies this assay can detect is 250 copies / mL. A negative result does not preclude SARS-CoV-2 infection and should not be used as the sole basis for treatment or other patient management decisions.  A negative result may occur with improper specimen collection / handling, submission of specimen other than nasopharyngeal swab, presence of viral mutation(s) within the areas targeted by this assay, and inadequate number of viral copies (<250 copies / mL). A negative result must be combined with clinical observations, patient history, and epidemiological information.  Fact Sheet for Patients:   StrictlyIdeas.no  Fact Sheet for Healthcare Providers: BankingDealers.co.za  This test is not yet approved or  cleared by the [place] FDA and has been authorized for detection and/or diagnosis of SARS-CoV-2 by FDA under an Emergency Use Authorization (EUA).  This EUA will remain in effect (meaning this test can be used) for the  duration of the COVID-19 declaration under Section 564(b)(1) of the Act, 21  U.S.C. section 360bbb-3(b)(1), unless the authorization is terminated or revoked sooner.  Performed at [facility], [address] [place], [place] [address]   Blood culture (routine x 2)     Status: Abnormal   Collection Time: 09/08/19 12:07 PM   Specimen: BLOOD  Result Value Ref Range Status   Specimen Description   Final    BLOOD L DISTAL FOREARM Performed at [facility], [address] [place], [place] [address]    Special Requests   Final    BOTTLES DRAWN AEROBIC AND ANAEROBIC Blood Culture results may not be optimal due to an excessive volume of blood received in culture bottles Performed at [facility], [address] [place], [place] [address]    Culture  Setup Time   Final    GRAM POSITIVE COCCI AEROBIC BOTTLE ONLY CRITICAL RESULT CALLED TO, READ BACK BY AND VERIFIED WITH: MORGAN CUNNINGHAM  AT 0076 ON 09/09/19 SNG    Culture (A)  Final    STAPHYLOCOCCUS EPIDERMIDIS THE SIGNIFICANCE OF ISOLATING THIS ORGANISM FROM A SINGLE SET OF BLOOD CULTURES WHEN MULTIPLE SETS ARE DRAWN IS UNCERTAIN. PLEASE NOTIFY THE MICROBIOLOGY DEPARTMENT WITHIN ONE WEEK IF SPECIATION AND SENSITIVITIES ARE REQUIRED. Performed at [facility], [place] [address], [place], [place] [address]    Report Status 09/11/2019 FINAL  Final  Blood Culture ID Panel (Reflexed)     Status: Abnormal   Collection Time: 09/08/19 12:07 PM  Result Value Ref Range Status   Enterococcus faecalis NOT DETECTED NOT DETECTED Final   Enterococcus Faecium NOT DETECTED NOT DETECTED Final   Listeria monocytogenes NOT DETECTED NOT DETECTED Final   Staphylococcus species DETECTED (A) NOT DETECTED Final    Comment: CRITICAL RESULT CALLED TO, READ BACK BY AND VERIFIED WITH: MORGAN CUNNINGHAM AT 1345 ON 09/09/19 SNG    Staphylococcus aureus (BCID) NOT DETECTED NOT DETECTED Final   Staphylococcus epidermidis DETECTED (A) NOT DETECTED Final    Comment: CRITICAL RESULT CALLED TO, READ BACK BY  AND VERIFIED WITH: MORGAN CUNNINGHAM AT 1345 ON 09/09/19 SNG    Staphylococcus lugdunensis NOT DETECTED NOT DETECTED Final   Streptococcus species NOT DETECTED NOT DETECTED Final   Streptococcus agalactiae NOT DETECTED NOT DETECTED Final   Streptococcus pneumoniae NOT DETECTED NOT DETECTED Final   Streptococcus pyogenes NOT DETECTED NOT DETECTED Final   A.calcoaceticus-baumannii NOT DETECTED NOT DETECTED Final   Bacteroides fragilis NOT DETECTED NOT DETECTED Final   Enterobacterales NOT DETECTED NOT DETECTED Final   Enterobacter cloacae complex NOT DETECTED NOT DETECTED Final   Escherichia coli NOT DETECTED NOT DETECTED Final   Klebsiella aerogenes NOT DETECTED NOT DETECTED Final   Klebsiella oxytoca NOT DETECTED NOT DETECTED Final   Klebsiella pneumoniae NOT DETECTED NOT DETECTED Final   Proteus species NOT DETECTED NOT DETECTED Final   Salmonella species NOT DETECTED NOT DETECTED Final   Serratia marcescens NOT DETECTED NOT DETECTED Final   Haemophilus influenzae NOT DETECTED NOT DETECTED Final   Neisseria meningitidis NOT DETECTED NOT DETECTED Final   Pseudomonas aeruginosa NOT DETECTED NOT DETECTED Final   Stenotrophomonas maltophilia NOT DETECTED NOT DETECTED Final   Candida albicans NOT DETECTED NOT DETECTED Final   Candida auris NOT DETECTED NOT DETECTED Final   Candida glabrata NOT DETECTED NOT DETECTED Final   Candida krusei NOT DETECTED NOT DETECTED Final   Candida parapsilosis NOT DETECTED NOT DETECTED Final   Candida tropicalis NOT DETECTED NOT  DETECTED Final   Cryptococcus neoformans/gattii NOT DETECTED NOT DETECTED Final   Methicillin resistance mecA/C NOT DETECTED NOT DETECTED Final    Comment: Performed at [facility], [address] [place], [place] [address]  Blood culture (routine x 2)     Status: None (Preliminary result)   Collection Time: 09/08/19 12:59 PM   Specimen: BLOOD  Result Value Ref Range Status   Specimen Description BLOOD LEFT UPPER  FORARM  Final   Special Requests   Final    BOTTLES DRAWN AEROBIC AND ANAEROBIC Blood Culture results may not be optimal due to an excessive volume of blood received in culture bottles   Culture   Final    NO GROWTH 3 DAYS Performed at [facility], [address], [place], [place] [address]    Report Status PENDING  Incomplete    Anti-infectives:  Anti-infectives (From admission, onward)   Start     Dose/Rate Route Frequency Ordered Stop   09/09/19 2200  fluconazole (DIFLUCAN) IVPB 400 mg        400 mg 100 mL/hr over 120 Minutes Intravenous Every 24 hours 09/09/19 1300 09/15/19 2159   09/09/19 1500  cefTRIAXone (ROCEPHIN) 2 g in sodium chloride 0.9 % 100 mL IVPB        2 g 200 mL/hr over 30 Minutes Intravenous Every 24 hours 09/09/19 1321     09/09/19 1000  azithromycin (ZITHROMAX) tablet 250 mg       "Followed by" Linked Group Details   250 mg Oral Daily 09/08/19 1437 09/13/19 0959   09/08/19 2151  fluconazole (DIFLUCAN) IVPB 200 mg  Status:  Discontinued        200 mg 100 mL/hr over 60 Minutes Intravenous Every 24 hours 09/08/19 1850 09/09/19 1300   09/08/19 1445  cefTRIAXone (ROCEPHIN) 1 g in sodium chloride 0.9 % 100 mL IVPB  Status:  Discontinued        1 g 200 mL/hr over 30 Minutes Intravenous Every 24 hours 09/08/19 1436 09/09/19 1321   09/08/19 1445  azithromycin (ZITHROMAX) tablet 500 mg       "Followed by" Linked Group Details   500 mg Oral Daily 09/08/19 1436 09/08/19 1548       Consults: Treatment Team:  Minna Merritts, MD Liana Gerold, MD     PAST MEDICAL HISTORY   Past Medical History:  Diagnosis Date  . Congestive heart failure (CHF) ([place])   . Hypertension   . Hypothyroidism   . Morbid obesity ([place])   . Obesity hypoventilation syndrome ([place])   . Type 2 diabetes mellitus ([place])      SURGICAL HISTORY   Past Surgical History:  Procedure Laterality Date  . none       FAMILY HISTORY   Family History  Family history  unknown: Yes     SOCIAL HISTORY   Social History   Tobacco Use  . Smoking status: Former Research scientist (life sciences)  . Smokeless tobacco: Never Used  Substance Use Topics  . Alcohol use: Not Currently  . Drug use: Never     MEDICATIONS   Current Medication:  Current Facility-Administered Medications:  .  acetaminophen (TYLENOL) tablet 650 mg, 650 mg, Oral, Q6H PRN **OR** acetaminophen (TYLENOL) suppository 650 mg, 650 mg, Rectal, Q6H PRN, Fritzi Mandes, MD .  Margrett Rud azithromycin (ZITHROMAX) tablet 500 mg, 500 mg, Oral, Daily, 500 mg at 09/08/19 1548 **FOLLOWED BY** azithromycin (ZITHROMAX) tablet 250 mg, 250 mg, Oral, Daily, Fritzi Mandes, MD, 250 mg at 09/11/19  8299 .  cefTRIAXone (ROCEPHIN) 2 g in sodium chloride 0.9 % 100 mL IVPB, 2 g, Intravenous, Q24H, Ottie Glazier, MD, Stopped at 09/10/19 1440 .  Chlorhexidine Gluconate Cloth 2 % PADS 6 each, 6 each, Topical, Daily, Tukov-Yual, Magdalene S, NP, 6 each at 09/10/19 0600 .  fluconazole (DIFLUCAN) IVPB 400 mg, 400 mg, Intravenous, Q24H, Dallie Piles, RPH, Stopped at 09/10/19 2337 .  heparin ADULT infusion 100 units/mL (25000 units/28mL sodium chloride 0.45%), 1,600 Units/hr, Intravenous, Continuous, Hall, Scott A, RPH, Last Rate: 16 mL/hr at 09/11/19 1000, 1,600 Units/hr at 09/11/19 1000 .  heparin injection 1,000-6,000 Units, 1,000-6,000 Units, CRRT, PRN, Bhutani, Manpreet S, MD .  heparin sodium (porcine) injection 1,000-6,000 Units, 1,000-6,000 Units, Intracatheter, PRN, Fritzi Mandes, MD .  hydrocortisone sodium succinate (SOLU-CORTEF) 100 MG injection 100 mg, 100 mg, Intravenous, Q12H, Lanney Gins, Ammarie Matsuura, MD, 100 mg at 09/11/19 0015 .  insulin aspart (novoLOG) injection 0-15 Units, 0-15 Units, Subcutaneous, Q4H, Ottie Glazier, MD, 2 Units at 09/11/19 0743 .  ipratropium-albuterol (DUONEB) 0.5-2.5 (3) MG/3ML nebulizer solution 3 mL, 3 mL, Nebulization, Q6H, Tukov-Yual, Magdalene S, NP, 3 mL at 09/11/19 0825 .  levothyroxine (SYNTHROID) tablet  175 mcg, 175 mcg, Oral, Daily, Fritzi Mandes, MD, 175 mcg at 09/11/19 0934 .  MEDLINE mouth rinse, 15 mL, Mouth Rinse, BID, Mischelle Reeg, MD, 15 mL at 09/11/19 0936 .  midodrine (PROAMATINE) tablet 10 mg, 10 mg, Oral, TID WC, Yee Joss, MD, 10 mg at 09/11/19 0743 .  norepinephrine (LEVOPHED) 16 mg in 249mL premix infusion, 0-40 mcg/min, Intravenous, Titrated, Max Romano, MD, Last Rate: 7.5 mL/hr at 09/11/19 1000, 8 mcg/min at 09/11/19 1000 .  ondansetron (ZOFRAN) tablet 4 mg, 4 mg, Oral, Q6H PRN **OR** ondansetron (ZOFRAN) injection 4 mg, 4 mg, Intravenous, Q6H PRN, Fritzi Mandes, MD .  pantoprazole (PROTONIX) injection 40 mg, 40 mg, Intravenous, Q24H, Tukov-Yual, Magdalene S, NP, 40 mg at 09/11/19 0547 .  polyethylene glycol (MIRALAX / GLYCOLAX) packet 17 g, 17 g, Oral, Daily PRN, Fritzi Mandes, MD .  prismasol BGK 2/2.5 dialysis solution, , CRRT, Continuous, Kolluru, Sarath, MD, Last Rate: 1,500 mL/hr at 09/11/19 0945, New Bag at 09/11/19 0945 .  prismasol BGK 2/2.5 replacement solution, , CRRT, Continuous, Kolluru, Sarath, MD, Last Rate: 300 mL/hr at 09/11/19 0254, New Bag at 09/11/19 0254 .  prismasol BGK 2/2.5 replacement solution, , CRRT, Continuous, Kolluru, Sarath, MD, Last Rate: 300 mL/hr at 09/11/19 0254, New Bag at 09/11/19 0254 .  senna (SENOKOT) tablet 8.6 mg, 1 tablet, Oral, BID, Fritzi Mandes, MD, 8.6 mg at 09/11/19 0933 .  sodium chloride 0.9 % primer fluid for CRRT, , CRRT, PRN, Bhutani, Manpreet S, MD .  vasopressin (PITRESSIN) 20 Units in sodium chloride 0.9 % 100 mL infusion-*FOR SHOCK*, 0-0.03 Units/min, Intravenous, Continuous, Ottie Glazier, MD, Paused at 09/11/19 0700 .  zinc oxide (BALMEX) 37.1 % cream 1 application, 1 application, Topical, Daily, Ottie Glazier, MD, 1 application at 69/67/89 0936    ALLERGIES   Penicillins    REVIEW OF SYSTEMS    Unable to obtain due to lethargy  PHYSICAL EXAMINATION   Vital Signs: Temp:  [97.6 F (36.4 C)-98.8 F  (37.1 C)] 97.6 F (36.4 C) (08/31 0800) Pulse Rate:  [64-90] 81 (08/31 0900) Resp:  [12-21] 18 (08/31 0900) BP: (87-146)/(48-90) 110/48 (08/31 0900) SpO2:  [92 %-100 %] 95 % (08/31 0900) FiO2 (%):  [40 %] 40 % (08/31 0400) Weight:  [381 kg] 163 kg (08/31 0451)  GENERAL:Age appropriate on BIPAP  lethargic HEAD: Normocephalic, atraumatic.  EYES: Pupils equal, round, reactive to light.  No scleral icterus.  MOUTH: Moist mucosal membrane. NECK: Supple. No thyromegaly. No nodules. No JVD.  PULMONARY: decreased breath sounds bilatearlly  CARDIOVASCULAR: S1 and S2. Regular rate and rhythm. No murmurs, rubs, or gallops.  GASTROINTESTINAL: excoriated rash with skin breakdown of anterior lowe abdominal pannus.  MUSCULOSKELETAL: No swelling, clubbing, or edema.  NEUROLOGIC:  GCS6 SKIN:intact,warm,dry   PERTINENT DATA     Infusions: . cefTRIAXone (ROCEPHIN)  IV Stopped (09/10/19 1440)  . fluconazole (DIFLUCAN) IV Stopped (09/10/19 2337)  . heparin 1,600 Units/hr (09/11/19 1000)  . norepinephrine (LEVOPHED) Adult infusion 8 mcg/min (09/11/19 1000)  . prismasol BGK 2/2.5 dialysis solution 1,500 mL/hr at 09/11/19 0945  . prismasol BGK 2/2.5 replacement solution 300 mL/hr at 09/11/19 0254  . prismasol BGK 2/2.5 replacement solution 300 mL/hr at 09/11/19 0254  . vasopressin Stopped (09/11/19 0700)   Scheduled Medications: . azithromycin  250 mg Oral Daily  . Chlorhexidine Gluconate Cloth  6 each Topical Daily  . hydrocortisone sod succinate (SOLU-CORTEF) inj  100 mg Intravenous Q12H  . insulin aspart  0-15 Units Subcutaneous Q4H  . ipratropium-albuterol  3 mL Nebulization Q6H  . levothyroxine  175 mcg Oral Daily  . mouth rinse  15 mL Mouth Rinse BID  . midodrine  10 mg Oral TID WC  . pantoprazole (PROTONIX) IV  40 mg Intravenous Q24H  . senna  1 tablet Oral BID  . zinc oxide  1 application Topical Daily   PRN Medications: acetaminophen **OR** acetaminophen, heparin, heparin sodium  (porcine), ondansetron **OR** ondansetron (ZOFRAN) IV, polyethylene glycol, sodium chloride Hemodynamic parameters:   Intake/Output: 08/30 0701 - 08/31 0700 In: 1664.5 [P.O.:20; I.V.:1324.4; IV Piggyback:300.1] Out: 1610 [Urine:452]  Ventilator  Settings: FiO2 (%):  [40 %] 40 %   LAB RESULTS:  Basic Metabolic Panel: Recent Labs  Lab 09/08/19 1205 09/08/19 1205 09/09/19 0329 09/09/19 1004 09/09/19 1613 09/09/19 1613 09/10/19 0409 09/11/19 0433  NA 140  --  139  --  141  --  139 136  K 5.3*   < > 5.3*   < > 6.0*   < > 5.2* 4.2  CL 108  --  109  --  112*  --  110 105  CO2 24  --  22  --  20*  --  25 25  GLUCOSE 88  --  86  --  115*  --  136* 158*  BUN 94*  --  94*  --  95*  --  82* 51*  CREATININE 4.47*  --  4.49*  --  4.44*  --  3.67* 2.05*  CALCIUM 8.0*  --  7.7*  --  8.2*  --  7.9* 8.4*  MG 2.6*  --  2.2  --  2.2  --  2.2 2.0  PHOS  --   --  7.9*  --  7.6*  --   --  3.8   < > = values in this interval not displayed.   Liver Function Tests: Recent Labs  Lab 09/08/19 1205 09/09/19 0329  AST 25 48*  ALT 16 19  ALKPHOS 89 88  BILITOT 1.5* 1.3*  PROT 7.7 7.2  ALBUMIN 3.0* 2.7*   No results for input(s): LIPASE, AMYLASE in the last 168 hours. No results for input(s): AMMONIA in the last 168 hours. CBC: Recent Labs  Lab 09/08/19 1205 09/09/19 0329 09/10/19 0409 09/11/19 0433  WBC 10.8* 13.0* 11.5* 11.4*  NEUTROABS 8.9*  --   --   --  HGB 11.2* 10.7* 10.3* 10.0*  HCT 37.7* 36.3* 34.1* 31.7*  MCV 104.1* 104.3* 104.0* 99.4  PLT 174 186 156 137*   Cardiac Enzymes: Recent Labs  Lab 09/09/19 0329  CKTOTAL 2,443*   BNP: Invalid input(s): POCBNP CBG: Recent Labs  Lab 09/10/19 1541 09/10/19 1938 09/11/19 0010 09/11/19 0318 09/11/19 0727  GLUCAP 144* 139* 153* 138* 133*       IMAGING RESULTS:  Imaging: US Venous Img Lower Bilateral (DVT)  Result Date: 09/09/2019 CLINICAL DATA:  Swelling in the bilateral legs, rule out DVT. EXAM: BILATERAL  LOWER EXTREMITY VENOUS DOPPLER ULTRASOUND TECHNIQUE: Gray-scale sonography with compression, as well as color and duplex ultrasound, were performed to evaluate the deep venous system(s) from the level of the common femoral vein through the popliteal and proximal calf veins. COMPARISON:  None. FINDINGS: VENOUS Normal compressibility of the common femoral, superficial femoral, and popliteal veins, as well as the visualized calf veins. Visualized portions of profunda femoral vein and great saphenous vein unremarkable. No filling defects to suggest DVT on grayscale or color Doppler imaging. Doppler waveforms show normal direction of venous flow, normal respiratory plasticity and response to augmentation. Limited views of the contralateral common femoral vein are unremarkable. OTHER None. Limitations: none IMPRESSION: Negative for DVT in the bilateral lower extremities. Electronically Signed   By: Audie Pinto M.D.   On: 09/09/2019 14:38   @PROBHOSP @ No results found.     ASSESSMENT AND PLAN     Acute Hypoxic Respiratory Failure -negative COVID -8/29 -due to most likely OHS/chronic thoracic restriction with concomitant OSA -background of CHF- repeat TTE  -CXR reviewd independently by me- cardiomegaly, mild interstitial infiltrates with platelike atelectasis bilaterally. 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    - will order [place] LE and ddimer today   - patient empirically on heparin gtt  -plan for poss CTPE once renal impairment improved.   - may be due to AKI, will trend trop     Encephalopathy     -toxic metabolic vs hypercapnic.     - correct electrolyte derrangement    -IVF rehydration     -improved with BIPAP  Excoriated rash of abdominal pannus -dressing per RN -wound care consultation  -empiric abx    ID -continue IV abx as prescibed -follow up cultures  GI/Nutrition GI PROPHYLAXIS as indicated DIET-->TF's as tolerated Constipation protocol as indicated  ENDO - ICU hypoglycemic\Hyperglycemia protocol -check FSBS per protocol   ELECTROLYTES -follow labs as needed -replace as needed -pharmacy consultation   DVT/GI PRX ordered -SCDs  TRANSFUSIONS AS NEEDED MONITOR FSBS ASSESS the need for LABS as needed   Critical care provider statement:    Critical care time (minutes):  34   Critical care time was exclusive of:  Separately billable  procedures and treating other patients   Critical care was necessary to treat or prevent imminent or life-threatening deterioration of the following conditions:  Encephalopathy, morbid obesity, AKI, demand ischemia, mulpiple comorbid conditions   Critical care was time spent personally by me on the following activities:  Development of treatment plan with patient or surrogate, discussions with consultants, evaluation of patient's response to treatment, examination of patient, obtaining history from patient or surrogate, ordering and performing treatments and interventions, ordering and review of laboratory studies and re-evaluation of patient's condition.  I assumed direction of critical care for this patient from another provider in my specialty: no    This document was prepared using  Dragon voice recognition software and may include unintentional dictation errors.    Ottie Glazier, M.D.  Division of [place]

## 2019-09-12 LAB — CBC
HCT: 28.8 % — ABNORMAL LOW (ref 39.0–52.0)
Hemoglobin: 8.8 g/dL — ABNORMAL LOW (ref 13.0–17.0)
MCH: 31.7 pg (ref 26.0–34.0)
MCHC: 30.6 g/dL (ref 30.0–36.0)
MCV: 103.6 fL — ABNORMAL HIGH (ref 80.0–100.0)
Platelets: 129 10*3/uL — ABNORMAL LOW (ref 150–400)
RBC: 2.78 MIL/uL — ABNORMAL LOW (ref 4.22–5.81)
RDW: 17.2 % — ABNORMAL HIGH (ref 11.5–15.5)
WBC: 11.8 10*3/uL — ABNORMAL HIGH (ref 4.0–10.5)
nRBC: 0.3 % — ABNORMAL HIGH (ref 0.0–0.2)

## 2019-09-12 LAB — BASIC METABOLIC PANEL
Anion gap: 4 — ABNORMAL LOW (ref 5–15)
BUN: 42 mg/dL — ABNORMAL HIGH (ref 8–23)
CO2: 27 mmol/L (ref 22–32)
Calcium: 8.9 mg/dL (ref 8.9–10.3)
Chloride: 104 mmol/L (ref 98–111)
Creatinine, Ser: 1.69 mg/dL — ABNORMAL HIGH (ref 0.61–1.24)
GFR calc Af Amer: 44 mL/min — ABNORMAL LOW (ref >60)
GFR calc non Af Amer: 38 mL/min — ABNORMAL LOW (ref >60)
Glucose, Bld: 154 mg/dL — ABNORMAL HIGH (ref 70–99)
Potassium: 3.7 mmol/L (ref 3.5–5.1)
Sodium: 135 mmol/L (ref 135–145)

## 2019-09-12 LAB — GLUCOSE, CAPILLARY
Glucose-Capillary: 149 mg/dL — ABNORMAL HIGH (ref 70–99)
Glucose-Capillary: 157 mg/dL — ABNORMAL HIGH (ref 70–99)
Glucose-Capillary: 144 mg/dL — ABNORMAL HIGH (ref 70–99)
Glucose-Capillary: 138 mg/dL — ABNORMAL HIGH (ref 70–99)
Glucose-Capillary: 163 mg/dL — ABNORMAL HIGH (ref 70–99)
Glucose-Capillary: 138 mg/dL — ABNORMAL HIGH (ref 70–99)

## 2019-09-12 LAB — MAGNESIUM: Magnesium: 2 mg/dL (ref 1.7–2.4)

## 2019-09-12 LAB — PHOSPHORUS: Phosphorus: 2.9 mg/dL (ref 2.5–4.6)

## 2019-09-12 LAB — ANCA TITERS
Atypical P-ANCA titer: <1:20 {titer}
C-ANCA: <1:20 {titer}
P-ANCA: <1:20 {titer}

## 2019-09-12 LAB — HEPARIN LEVEL (UNFRACTIONATED)
Heparin Unfractionated: 0.79 IU/mL — ABNORMAL HIGH (ref 0.30–0.70)
Heparin Unfractionated: 0.8 IU/mL — ABNORMAL HIGH (ref 0.30–0.70)

## 2019-09-12 MED ORDER — PRISMASOL BGK 4/2.5 32-4-2.5 MEQ/L IV SOLN: INTRAVENOUS | Status: DC

## 2019-09-12 MED ORDER — IPRATROPIUM-ALBUTEROL 0.5-2.5 (3) MG/3ML IN SOLN
3.0000 mL | Freq: Three times a day (TID) | RESPIRATORY_TRACT | Status: DC
  Administered 2019-09-13 – 2019-09-17 (×11): 3 mL via RESPIRATORY_TRACT
  Filled 2019-09-12 (×11): qty 3

## 2019-09-12 MED ORDER — AMOXICILLIN-POT CLAVULANATE 875-125 MG PO TABS
1.0000 | ORAL_TABLET | Freq: Two times a day (BID) | ORAL | Status: DC
  Administered 2019-09-12 – 2019-09-19 (×12): 1 via ORAL
  Filled 2019-09-12 (×15): qty 1

## 2019-09-12 MED ORDER — NYSTATIN 100000 UNIT/GM EX CREA
TOPICAL_CREAM | Freq: Two times a day (BID) | CUTANEOUS | Status: DC
  Administered 2019-09-12: 1 via TOPICAL
  Filled 2019-09-12: qty 15

## 2019-09-12 MED ORDER — PRISMASOL BGK 4/2.5 32-4-2.5 MEQ/L REPLACEMENT SOLN
Status: DC
  Filled 2019-09-12: qty 5000

## 2019-09-12 NOTE — Progress Notes (Signed)
CRITICAL CARE PROGRESS NOTE    Name: Andrew Valentine MRN: [id number] DOB: 1940-09-07     LOS: 4   SUBJECTIVE FINDINGS & SIGNIFICANT EVENTS    Patient description:  40M hx of Morbid obesity BMI >50, CHF, DM, hypothoroidism, came from home due to dehydration and AKI.  He lives alone and cannot take care of himself. He was noted to be lethargic in ED with hypercapnia and hypoxemia.  I spoke to daughter she states she came to check on him and blood glucose was 47, he was unable to get out of chair. He was weak could not walk, desaturating 70s.  He was encephalopathic at this time. Daughter lives next door.    09/10/19- patient is awake this am, he on levphed 25, hes on CRRT with dyasylate modifcation this am by renal team. S/p wound care eval, UOP has improved.   8/31- patient weaned off vasopressin, weaned levophed from 25>>10.  9/1 - patient was evaluated by [facility], he does not wish to have permanent dialysis.  Code status discussion in progress.    Lines/tubes : Urethral Catheter Maureen RN Straight-tip 16 Fr. (Active)  Indication for Insertion or Continuance of Catheter Unstable critically ill patients first 24-48 hours (See Criteria) 09/09/19 0729  Site Assessment Clean;Intact 09/09/19 0729  Catheter Maintenance Bag below level of bladder;Drainage bag/tubing not touching floor;No dependent loops;Seal intact 09/09/19 0729  Collection Container Standard drainage bag 09/09/19 0729  Securement Method Securing device (Describe) 09/09/19 0729  Urinary Catheter Interventions (if applicable) Unclamped 35/70/17 0729  Output (mL) 40 mL 09/09/19 0400    Microbiology/Sepsis markers: Results for orders placed or performed during the hospital encounter of 09/08/19  MRSA PCR Screening     Status: None   Collection Time: 09/08/19  11:49 AM   Specimen: Nasopharyngeal  Result Value Ref Range Status   MRSA by PCR NEGATIVE NEGATIVE Final    Comment:        The GeneXpert MRSA Assay (FDA approved for NASAL specimens only), is one component of a comprehensive MRSA colonization surveillance program. It is not intended to diagnose MRSA infection nor to guide or monitor treatment for MRSA infections. Performed at [facility], [address] [place], [place] [address]   SARS Coronavirus 2 by RT PCR (hospital order, performed in [facility] hospital lab) Nasopharyngeal Nasopharyngeal Swab     Status: None   Collection Time: 09/08/19 12:05 PM   Specimen: Nasopharyngeal Swab  Result Value Ref Range Status   SARS Coronavirus 2 NEGATIVE NEGATIVE Final    Comment: (NOTE) SARS-CoV-2 target nucleic acids are NOT DETECTED.  The SARS-CoV-2 RNA is generally detectable in upper and lower respiratory specimens during the acute phase of infection. The lowest concentration of SARS-CoV-2 viral copies this assay can detect is 250 copies / mL. A negative result does not preclude SARS-CoV-2 infection and should not be used as the sole basis for treatment or other patient management decisions.  A negative result may occur with improper specimen collection / handling, submission of specimen other than nasopharyngeal swab, presence of viral mutation(s) within the areas targeted by this assay, and inadequate number of viral copies (<250 copies / mL). A negative result must be combined with clinical observations, patient history, and epidemiological information.  Fact Sheet for Patients:   StrictlyIdeas.no  Fact Sheet for Healthcare Providers: BankingDealers.co.za  This test is not yet approved or  cleared by the [place] FDA and has been authorized for detection and/or diagnosis of SARS-CoV-2 by  FDA under an Emergency Use Authorization (EUA).  This EUA will remain in  effect (meaning this test can be used) for the duration of the COVID-19 declaration under Section 564(b)(1) of the Act, 21 U.S.C. section 360bbb-3(b)(1), unless the authorization is terminated or revoked sooner.  Performed at [facility], [address] [place], [place] [address]   Blood culture (routine x 2)     Status: Abnormal   Collection Time: 09/08/19 12:07 PM   Specimen: BLOOD  Result Value Ref Range Status   Specimen Description   Final    BLOOD L DISTAL FOREARM Performed at [facility], [address] [place], [place] [address]    Special Requests   Final    BOTTLES DRAWN AEROBIC AND ANAEROBIC Blood Culture results may not be optimal due to an excessive volume of blood received in culture bottles Performed at [facility], [address] [place], [place] [address]    Culture  Setup Time   Final    GRAM POSITIVE COCCI AEROBIC BOTTLE ONLY CRITICAL RESULT CALLED TO, READ BACK BY AND VERIFIED WITH: MORGAN CUNNINGHAM  AT 4196 ON 09/09/19 SNG    Culture (A)  Final    STAPHYLOCOCCUS EPIDERMIDIS THE SIGNIFICANCE OF ISOLATING THIS ORGANISM FROM A SINGLE SET OF BLOOD CULTURES WHEN MULTIPLE SETS ARE DRAWN IS UNCERTAIN. PLEASE NOTIFY THE MICROBIOLOGY DEPARTMENT WITHIN ONE WEEK IF SPECIATION AND SENSITIVITIES ARE REQUIRED. Performed at [facility], [place] [address], [place], [place] [address]    Report Status 09/11/2019 FINAL  Final  Blood Culture ID Panel (Reflexed)     Status: Abnormal   Collection Time: 09/08/19 12:07 PM  Result Value Ref Range Status   Enterococcus faecalis NOT DETECTED NOT DETECTED Final   Enterococcus Faecium NOT DETECTED NOT DETECTED Final   Listeria monocytogenes NOT DETECTED NOT DETECTED Final   Staphylococcus species DETECTED (A) NOT DETECTED Final    Comment: CRITICAL RESULT CALLED TO, READ BACK BY AND VERIFIED WITH: MORGAN CUNNINGHAM AT 1345 ON 09/09/19 SNG    Staphylococcus aureus (BCID) NOT DETECTED NOT DETECTED  Final   Staphylococcus epidermidis DETECTED (A) NOT DETECTED Final    Comment: CRITICAL RESULT CALLED TO, READ BACK BY AND VERIFIED WITH: MORGAN CUNNINGHAM AT 1345 ON 09/09/19 SNG    Staphylococcus lugdunensis NOT DETECTED NOT DETECTED Final   Streptococcus species NOT DETECTED NOT DETECTED Final   Streptococcus agalactiae NOT DETECTED NOT DETECTED Final   Streptococcus pneumoniae NOT DETECTED NOT DETECTED Final   Streptococcus pyogenes NOT DETECTED NOT DETECTED Final   A.calcoaceticus-baumannii NOT DETECTED NOT DETECTED Final   Bacteroides fragilis NOT DETECTED NOT DETECTED Final   Enterobacterales NOT DETECTED NOT DETECTED Final   Enterobacter cloacae complex NOT DETECTED NOT DETECTED Final   Escherichia coli NOT DETECTED NOT DETECTED Final   Klebsiella aerogenes NOT DETECTED NOT DETECTED Final   Klebsiella oxytoca NOT DETECTED NOT DETECTED Final   Klebsiella pneumoniae NOT DETECTED NOT DETECTED Final   Proteus species NOT DETECTED NOT DETECTED Final   Salmonella species NOT DETECTED NOT DETECTED Final   Serratia marcescens NOT DETECTED NOT DETECTED Final   Haemophilus influenzae NOT DETECTED NOT DETECTED Final   Neisseria meningitidis NOT DETECTED NOT DETECTED Final   Pseudomonas aeruginosa NOT DETECTED NOT DETECTED Final   Stenotrophomonas maltophilia NOT DETECTED NOT DETECTED Final   Candida albicans NOT DETECTED NOT DETECTED Final   Candida auris NOT DETECTED NOT DETECTED Final   Candida glabrata NOT DETECTED NOT DETECTED Final   Candida  krusei NOT DETECTED NOT DETECTED Final   Candida parapsilosis NOT DETECTED NOT DETECTED Final   Candida tropicalis NOT DETECTED NOT DETECTED Final   Cryptococcus neoformans/gattii NOT DETECTED NOT DETECTED Final   Methicillin resistance mecA/C NOT DETECTED NOT DETECTED Final    Comment: Performed at [facility], [address] [place], [place] [address]  Blood culture (routine x 2)     Status: None (Preliminary result)   Collection  Time: 09/08/19 12:59 PM   Specimen: BLOOD  Result Value Ref Range Status   Specimen Description BLOOD LEFT UPPER FORARM  Final   Special Requests   Final    BOTTLES DRAWN AEROBIC AND ANAEROBIC Blood Culture results may not be optimal due to an excessive volume of blood received in culture bottles   Culture   Final    NO GROWTH 4 DAYS Performed at [facility], [address] [place], [place] [address]    Report Status PENDING  Incomplete    Anti-infectives:  Anti-infectives (From admission, onward)   Start     Dose/Rate Route Frequency Ordered Stop   09/12/19 1100  amoxicillin-clavulanate (AUGMENTIN) 875-125 MG per tablet 1 tablet        1 tablet Oral Every 12 hours 09/12/19 1012     09/09/19 2200  fluconazole (DIFLUCAN) IVPB 400 mg  Status:  Discontinued        400 mg 100 mL/hr over 120 Minutes Intravenous Every 24 hours 09/09/19 1300 09/12/19 1012   09/09/19 1500  cefTRIAXone (ROCEPHIN) 2 g in sodium chloride 0.9 % 100 mL IVPB  Status:  Discontinued        2 g 200 mL/hr over 30 Minutes Intravenous Every 24 hours 09/09/19 1321 09/12/19 1012   09/09/19 1000  azithromycin (ZITHROMAX) tablet 250 mg       "Followed by" Linked Group Details   250 mg Oral Daily 09/08/19 1437 09/13/19 0959   09/08/19 2151  fluconazole (DIFLUCAN) IVPB 200 mg  Status:  Discontinued        200 mg 100 mL/hr over 60 Minutes Intravenous Every 24 hours 09/08/19 1850 09/09/19 1300   09/08/19 1445  cefTRIAXone (ROCEPHIN) 1 g in sodium chloride 0.9 % 100 mL IVPB  Status:  Discontinued        1 g 200 mL/hr over 30 Minutes Intravenous Every 24 hours 09/08/19 1436 09/09/19 1321   09/08/19 1445  azithromycin (ZITHROMAX) tablet 500 mg       "Followed by" Linked Group Details   500 mg Oral Daily 09/08/19 1436 09/08/19 1548       Consults: Treatment Team:  Minna Merritts, MD Liana Gerold, MD     PAST MEDICAL HISTORY   Past Medical History:  Diagnosis Date  . Congestive heart failure  (CHF) ([place])   . Hypertension   . Hypothyroidism   . Morbid obesity ([place])   . Obesity hypoventilation syndrome ([place])   . Type 2 diabetes mellitus ([place])      SURGICAL HISTORY   Past Surgical History:  Procedure Laterality Date  . none       FAMILY HISTORY   Family History  Family history unknown: Yes     SOCIAL HISTORY   Social History   Tobacco Use  . Smoking status: Former Research scientist (life sciences)  . Smokeless tobacco: Never Used  Substance Use Topics  . Alcohol use: Not Currently  . Drug use: Never     MEDICATIONS   Current Medication:  Current Facility-Administered Medications:  .  prismasol BGK 4/2.5 infusion, , CRRT, Continuous, Kolluru, Sarath, MD, Last Rate: 300 mL/hr at 09/12/19 1252, New Bag at 09/12/19 1252 .   prismasol BGK 4/2.5 infusion, , CRRT, Continuous, Kolluru, Sarath, MD, Last Rate: 300 mL/hr at 09/12/19 1259, New Bag at 09/12/19 1259 .  acetaminophen (TYLENOL) tablet 650 mg, 650 mg, Oral, Q6H PRN **OR** acetaminophen (TYLENOL) suppository 650 mg, 650 mg, Rectal, Q6H PRN, Fritzi Mandes, MD .  amoxicillin-clavulanate (AUGMENTIN) 875-125 MG per tablet 1 tablet, 1 tablet, Oral, Q12H, Ottie Glazier, MD, 1 tablet at 09/12/19 1153 .  [COMPLETED] azithromycin (ZITHROMAX) tablet 500 mg, 500 mg, Oral, Daily, 500 mg at 09/08/19 1548 **FOLLOWED BY** azithromycin (ZITHROMAX) tablet 250 mg, 250 mg, Oral, Daily, Fritzi Mandes, MD, 250 mg at 09/12/19 0805 .  Chlorhexidine Gluconate Cloth 2 % PADS 6 each, 6 each, Topical, Daily, Rebekah Zackery, MD, 6 each at 09/12/19 0511 .  heparin ADULT infusion 100 units/mL (25000 units/284mL sodium chloride 0.45%), 1,600 Units/hr, Intravenous, Continuous, Dallie Piles, RPH, Last Rate: 16 mL/hr at 09/12/19 1400, 1,600 Units/hr at 09/12/19 1400 .  heparin injection 1,000-6,000 Units, 1,000-6,000 Units, CRRT, PRN, Bhutani, Manpreet S, MD .  heparin sodium (porcine) injection 1,000-6,000 Units, 1,000-6,000 Units, Intracatheter, PRN, Fritzi Mandes,  MD .  hydrocortisone sodium succinate (SOLU-CORTEF) 100 MG injection 100 mg, 100 mg, Intravenous, Q12H, Jonathen Rathman, MD, 100 mg at 09/12/19 1151 .  insulin aspart (novoLOG) injection 0-15 Units, 0-15 Units, Subcutaneous, Q4H, Ottie Glazier, MD, 2 Units at 09/12/19 1152 .  ipratropium-albuterol (DUONEB) 0.5-2.5 (3) MG/3ML nebulizer solution 3 mL, 3 mL, Nebulization, Q6H, Tukov-Yual, Magdalene S, NP, 3 mL at 09/12/19 1339 .  levothyroxine (SYNTHROID) tablet 175 mcg, 175 mcg, Oral, Daily, Fritzi Mandes, MD, 175 mcg at 09/12/19 0510 .  MEDLINE mouth rinse, 15 mL, Mouth Rinse, BID, Nicklous Aburto, MD, 15 mL at 09/12/19 0805 .  midodrine (PROAMATINE) tablet 10 mg, 10 mg, Oral, TID WC, Tegh Franek, MD, 10 mg at 09/12/19 1151 .  norepinephrine (LEVOPHED) 16 mg in 22mL premix infusion, 0-40 mcg/min, Intravenous, Titrated, Monterio Bob, MD, Last Rate: 6.56 mL/hr at 09/12/19 1400, 7 mcg/min at 09/12/19 1400 .  nystatin cream (MYCOSTATIN), , Topical, BID, Ottie Glazier, MD, Given at 09/12/19 1206 .  ondansetron (ZOFRAN) tablet 4 mg, 4 mg, Oral, Q6H PRN **OR** ondansetron (ZOFRAN) injection 4 mg, 4 mg, Intravenous, Q6H PRN, Fritzi Mandes, MD .  pantoprazole (PROTONIX) injection 40 mg, 40 mg, Intravenous, Q24H, Tukov-Yual, Magdalene S, NP, 40 mg at 09/12/19 0609 .  polyethylene glycol (MIRALAX / GLYCOLAX) packet 17 g, 17 g, Oral, Daily PRN, Fritzi Mandes, MD .  prismasol BGK 4/2.5 infusion, , CRRT, Continuous, Kolluru, Sarath, MD, Last Rate: 1,500 mL/hr at 09/12/19 1302, New Bag at 09/12/19 1302 .  senna (SENOKOT) tablet 8.6 mg, 1 tablet, Oral, BID, Fritzi Mandes, MD, 8.6 mg at 09/12/19 0804 .  sodium chloride 0.9 % primer fluid for CRRT, , CRRT, PRN, Bhutani, Manpreet S, MD .  vasopressin (PITRESSIN) 20 Units in sodium chloride 0.9 % 100 mL infusion-*FOR SHOCK*, 0-0.03 Units/min, Intravenous, Continuous, Ottie Glazier, MD, Paused at 09/11/19 0700 .  zinc oxide (BALMEX) 41.7 % cream 1 application, 1  application, Topical, Daily, Advith Martine, MD, 1 application at 40/81/44 0805    ALLERGIES   Penicillins    REVIEW OF SYSTEMS    Unable to obtain due to lethargy  PHYSICAL EXAMINATION   Vital Signs: Temp:  [97.3 F (36.3 C)-98.1 F (36.7 C)] 98.1 F (36.7 C) (09/01 1200)  Pulse Rate:  [45-81] 73 (09/01 1400) Resp:  [14-18] 16 (09/01 1400) BP: (103-144)/(46-88) 115/49 (09/01 1300) SpO2:  [91 %-100 %] 97 % (09/01 1400) FiO2 (%):  [40 %] 40 % (09/01 0646) Weight:  [163.3 kg] 163.3 kg (09/01 0419)  GENERAL:Age appropriate on BIPAP lethargic HEAD: Normocephalic, atraumatic.  EYES: Pupils equal, round, reactive to light.  No scleral icterus.  MOUTH: Moist mucosal membrane. NECK: Supple. No thyromegaly. No nodules. No JVD.  PULMONARY: decreased breath sounds bilatearlly  CARDIOVASCULAR: S1 and S2. Regular rate and rhythm. No murmurs, rubs, or gallops.  GASTROINTESTINAL: excoriated rash with skin breakdown of anterior lowe abdominal pannus.  MUSCULOSKELETAL: No swelling, clubbing, or edema.  NEUROLOGIC:  GCS6 SKIN:intact,warm,dry   PERTINENT DATA     Infusions: .  prismasol BGK 4/2.5 300 mL/hr at 09/12/19 1252  .  prismasol BGK 4/2.5 300 mL/hr at 09/12/19 1259  . heparin 1,600 Units/hr (09/12/19 1400)  . norepinephrine (LEVOPHED) Adult infusion 7 mcg/min (09/12/19 1400)  . prismasol BGK 4/2.5 1,500 mL/hr at 09/12/19 1302  . vasopressin Stopped (09/11/19 0700)   Scheduled Medications: . amoxicillin-clavulanate  1 tablet Oral Q12H  . azithromycin  250 mg Oral Daily  . Chlorhexidine Gluconate Cloth  6 each Topical Daily  . hydrocortisone sod succinate (SOLU-CORTEF) inj  100 mg Intravenous Q12H  . insulin aspart  0-15 Units Subcutaneous Q4H  . ipratropium-albuterol  3 mL Nebulization Q6H  . levothyroxine  175 mcg Oral Daily  . mouth rinse  15 mL Mouth Rinse BID  . midodrine  10 mg Oral TID WC  . nystatin cream   Topical BID  . pantoprazole (PROTONIX) IV  40 mg  Intravenous Q24H  . senna  1 tablet Oral BID  . zinc oxide  1 application Topical Daily   PRN Medications: acetaminophen **OR** acetaminophen, heparin, heparin sodium (porcine), ondansetron **OR** ondansetron (ZOFRAN) IV, polyethylene glycol, sodium chloride Hemodynamic parameters:   Intake/Output: 08/31 0701 - 09/01 0700 In: 1091.7 [P.O.:120; I.V.:606.6; IV Piggyback:300.1] Out: 1096 [Urine:243]  Ventilator  Settings: FiO2 (%):  [40 %] 40 %   LAB RESULTS:  Basic Metabolic Panel: Recent Labs  Lab 09/09/19 0329 09/09/19 1004 09/09/19 1613 09/09/19 1613 09/10/19 0409 09/10/19 0409 09/11/19 0433 09/12/19 0419  NA 139  --  141  --  139  --  136 135  K 5.3*   < > 6.0*   < > 5.2*   < > 4.2 3.7  CL 109  --  112*  --  110  --  105 104  CO2 22  --  20*  --  25  --  25 27  GLUCOSE 86  --  115*  --  136*  --  158* 154*  BUN 94*  --  95*  --  82*  --  51* 42*  CREATININE 4.49*  --  4.44*  --  3.67*  --  2.05* 1.69*  CALCIUM 7.7*  --  8.2*  --  7.9*  --  8.4* 8.9  MG 2.2  --  2.2  --  2.2  --  2.0 2.0  PHOS 7.9*  --  7.6*  --   --   --  3.8 2.9   < > = values in this interval not displayed.   Liver Function Tests: Recent Labs  Lab 09/08/19 1205 09/09/19 0329  AST 25 48*  ALT 16 19  ALKPHOS 89 88  BILITOT 1.5* 1.3*  PROT 7.7 7.2  ALBUMIN 3.0* 2.7*   No  results for input(s): LIPASE, AMYLASE in the last 168 hours. No results for input(s): AMMONIA in the last 168 hours. CBC: Recent Labs  Lab 09/08/19 1205 09/09/19 0329 09/10/19 0409 09/11/19 0433 09/12/19 0711  WBC 10.8* 13.0* 11.5* 11.4* 11.8*  NEUTROABS 8.9*  --   --   --   --   HGB 11.2* 10.7* 10.3* 10.0* 8.8*  HCT 37.7* 36.3* 34.1* 31.7* 28.8*  MCV 104.1* 104.3* 104.0* 99.4 103.6*  PLT 174 186 156 137* 129*   Cardiac Enzymes: Recent Labs  Lab 09/09/19 0329  CKTOTAL 2,443*   BNP: Invalid input(s): POCBNP CBG: Recent Labs  Lab 09/11/19 1929 09/11/19 2321 09/12/19 0324 09/12/19 0718 09/12/19 1107    GLUCAP 159* 147* 149* 157* 144*       IMAGING RESULTS:  Imaging: DG Chest Port 1 View  Result Date: 09/11/2019 CLINICAL DATA:  Shortness of breath. EXAM: PORTABLE CHEST 1 VIEW COMPARISON:  Chest radiograph dated 09/09/2019 FINDINGS: The heart is enlarged. A left internal jugular central venous catheter tip overlies the superior vena cava. Moderate to severe bilateral interstitial and airspace opacities appear slightly increased since prior exam. A left pleural effusion likely contributes. There is no definite right pleural effusion. There is no pneumothorax. IMPRESSION: Moderate to severe bilateral interstitial and airspace opacities appear slightly increased since prior exam. A left pleural effusion likely contributes. Electronically Signed   By: Zerita Boers M.D.   On: 09/11/2019 14:22   @PROBHOSP @ No results found.     ASSESSMENT AND PLAN     Acute Hypoxic Respiratory Failure -negative COVID -8/29 -due to most likely OHS/chronic thoracic restriction with concomitant OSA -background of CHF- repeat TTE  -CXR reviewd independently by me- cardiomegaly, mild interstitial infiltrates with platelike atelectasis bilaterally. 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    - will order [place] LE and ddimer today   - patient  empirically on heparin gtt  -plan for poss CTPE once renal impairment improved.   - may be due to AKI, will trend trop     Encephalopathy     -toxic metabolic vs hypercapnic.     - correct electrolyte derrangement    -IVF rehydration     -improved with BIPAP  Excoriated rash of abdominal pannus -dressing per RN -wound care consultation  -empiric abx    ID -continue IV abx as prescibed -follow up cultures  GI/Nutrition GI PROPHYLAXIS as indicated DIET-->TF's as tolerated Constipation protocol as indicated  ENDO - ICU hypoglycemic\Hyperglycemia protocol -check FSBS per protocol   ELECTROLYTES -follow labs as needed -replace as needed -pharmacy consultation   DVT/GI PRX ordered -SCDs  TRANSFUSIONS AS NEEDED MONITOR FSBS ASSESS the need for LABS as needed   Critical care provider statement:    Critical care time (minutes):  34   Critical care time was exclusive of:  Separately  billable procedures and treating other patients   Critical care was necessary to treat or prevent imminent or life-threatening deterioration of the following conditions:  Encephalopathy, morbid obesity, AKI, demand ischemia, mulpiple comorbid conditions   Critical care was time spent personally by me on the following activities:  Development of treatment plan with patient or surrogate, discussions with consultants, evaluation of patient's response to treatment, examination of patient, obtaining history from patient or surrogate, ordering and performing treatments and interventions, ordering and review of laboratory studies and re-evaluation of patient's condition.  I assumed direction of critical care for this patient from another provider in my specialty: no    This document was prepared using Dragon voice recognition software and may include unintentional dictation errors.    Ottie Glazier, M.D.  Division of [facility]

## 2019-09-12 NOTE — Consult Note (Addendum)
Consultation Note Date: 09/12/2019   Patient Name: Andrew Valentine  DOB: 1940-05-16  MRN: [id number]  Age / Sex: 79 y.o., male  PCP: [facility] [place] Referring Physician: Ottie Glazier, MD  Reason for Consultation: Establishing goals of care  HPI/Patient Profile: Andrew Valentine  is a 79 y.o. male with a known history of morbid obesity, sleep apnea intolerant noncompliant to CPAP, hypertension, diabetes, hyperlipidemia, hypothyroidism, remote history of tobacco abuse comes to the emergency room with altered mental status and hypoxia.  Clinical Assessment and Goals of Care: Patient is resting in bed. He is hard of hearing and slow to respond. He tells me his name, where we are, the year of 2021, and the president.  He states he is a widower with 2 children. Son is an Tree surgeon, and lives out of state.   He states he lives alone and daughter lives next door. He uses a walker, and is independent with ADL's except for his daughter helping him get into and out of the shower due to balance.    We discussed his diagnoses, prognosis, GOC, EOL wishes disposition and options.  A detailed discussion was had today regarding advanced directives.  Concepts specific to code status, artifical feeding and hydration, IV antibiotics and rehospitalization were discussed.  The difference between an aggressive medical intervention path and a comfort care path was discussed.  Values and goals of care important to patient and family were attempted to be elicited.  Discussed limitations of medical interventions to prolong quality of life in some situations and discussed the concept of human mortality.  He states he would never want a feeding tube, to be placed on a ventilator, or to have CPR. He states if his current status was not changed next week at this time, he would not want to continue care. He does not want to live in a facility.  He is not interested in long term dialysis, even for another week.  He states he knows it will hurt his children, but when it is time for God to take him, he is ready. He states his daughter knows how he feels, but son does not.   Spoke with daughter. She agrees with his [place], and states that is what they have discussed. Plans for family meeting in the morning at 12:00.    I completed a MOST form today and the signed original was placed in the chart. Primary RN at bedside during conversation an signing. A photocopy was placed in the chart to be scanned into EMR. The patient outlined their wishes for the following treatment decisions:  Cardiopulmonary Resuscitation: Do Not Attempt Resuscitation (DNR/No CPR)  Medical Interventions: Limited Additional Interventions: Use medical treatment, IV fluids and cardiac monitoring as indicated, DO NOT USE intubation or mechanical ventilation. May consider use of less invasive airway support such as BiPAP or CPAP. Also provide comfort measures. Transfer to the hospital if indicated. Avoid intensive care.   Antibiotics: Antibiotics if indicated  IV Fluids: IV fluids if indicated  Feeding Tube:  No feeding tube      SUMMARY OF RECOMMENDATIONS   Family meeting tomorrow at 12:00.   Prognosis:   Poor       Primary Diagnoses: Present on Admission: . Acute hypoxemic respiratory failure ([place]) . Acute renal failure ([place])   I have reviewed the medical record, interviewed the patient and family, and examined the patient. The following aspects are pertinent.  Past Medical History:  Diagnosis Date  . Congestive heart failure (CHF) ([place])   . Hypertension   . Hypothyroidism   . Morbid obesity ([place])   . Obesity hypoventilation syndrome ([place])   . Type 2 diabetes mellitus ([place])    Social History   Socioeconomic History  . Marital status: Widowed    Spouse name: Not on file  . Number of children: Not on file  . Years of education: Not on file  .  Highest education level: Not on file  Occupational History  . Not on file  Tobacco Use  . Smoking status: Former Research scientist (life sciences)  . Smokeless tobacco: Never Used  Substance and Sexual Activity  . Alcohol use: Not Currently  . Drug use: Never  . Sexual activity: Not Currently  Other Topics Concern  . Not on file  Social History Narrative  . Not on file   Social Determinants of Health   Financial Resource Strain:   . Difficulty of Paying Living Expenses: Not on file  Food Insecurity:   . Worried About Charity fundraiser in the Last Year: Not on file  . Ran Out of Food in the Last Year: Not on file  Transportation Needs:   . Lack of Transportation (Medical): Not on file  . Lack of Transportation (Non-Medical): Not on file  Physical Activity:   . Days of Exercise per Week: Not on file  . Minutes of Exercise per Session: Not on file  Stress:   . Feeling of Stress : Not on file  Social Connections:   . Frequency of Communication with Friends and Family: Not on file  . Frequency of Social Gatherings with Friends and Family: Not on file  . Attends Religious Services: Not on file  . Active Member of Clubs or Organizations: Not on file  . Attends Archivist Meetings: Not on file  . Marital Status: Not on file   Family History  Family history unknown: Yes   Scheduled Meds: . amoxicillin-clavulanate  1 tablet Oral Q12H  . azithromycin  250 mg Oral Daily  . Chlorhexidine Gluconate Cloth  6 each Topical Daily  . hydrocortisone sod succinate (SOLU-CORTEF) inj  100 mg Intravenous Q12H  . insulin aspart  0-15 Units Subcutaneous Q4H  . ipratropium-albuterol  3 mL Nebulization Q6H  . levothyroxine  175 mcg Oral Daily  . mouth rinse  15 mL Mouth Rinse BID  . midodrine  10 mg Oral TID WC  . nystatin cream   Topical BID  . pantoprazole (PROTONIX) IV  40 mg Intravenous Q24H  . senna  1 tablet Oral BID  . zinc oxide  1 application Topical Daily   Continuous Infusions: .  prismasol  BGK 4/2.5 300 mL/hr at 09/12/19 1252  .  prismasol BGK 4/2.5 300 mL/hr at 09/12/19 1259  . heparin 1,600 Units/hr (09/12/19 1600)  . norepinephrine (LEVOPHED) Adult infusion 7 mcg/min (09/12/19 1600)  . prismasol BGK 4/2.5 1,500 mL/hr at 09/12/19 1302  . vasopressin Stopped (09/11/19 0700)   PRN Meds:.acetaminophen **OR** acetaminophen, heparin, heparin sodium (porcine), ondansetron **OR** ondansetron (ZOFRAN)  IV, polyethylene glycol, sodium chloride Medications Prior to Admission:  Prior to Admission medications   Medication Sig Start Date End Date Taking? Authorizing Provider  atenolol (TENORMIN) 100 MG tablet Take 100 mg by mouth daily. 08/16/19   [provider]  atorvastatin (LIPITOR) 40 MG tablet Take 40 mg by mouth daily. 08/16/19   [provider]  glipiZIDE (GLUCOTROL) 5 MG tablet Take 5 mg by mouth daily. 08/16/19   [provider]  hydrochlorothiazide (HYDRODIURIL) 25 MG tablet Take 25 mg by mouth daily. 08/16/19   [provider]  levothyroxine (SYNTHROID) 175 MCG tablet Take 175 mcg by mouth daily. 08/16/19   [provider]  lisinopril (ZESTRIL) 5 MG tablet Take 5 mg by mouth daily. 08/16/19   [provider]   Allergies  Allergen Reactions  . Penicillins Rash   Review of Systems  Constitutional: Positive for activity change and fatigue.    Physical Exam Pulmonary:     Effort: Pulmonary effort is normal.  Neurological:     Mental Status: He is alert.     Vital Signs: BP (!) 126/47   Pulse 74   Temp 98.1 F (36.7 C) (Axillary)   Resp 18   Ht 5\' 8"  (1.727 m)   Wt (!) 163.3 kg   SpO2 96%   BMI 54.74 kg/m  Pain Scale: 0-10   Pain Score: 0-No pain   SpO2: SpO2: 96 % O2 Device:SpO2: 96 % O2 Flow Rate: .O2 Flow Rate (L/min): 3 L/min  IO: Intake/output summary:   Intake/Output Summary (Last 24 hours) at 09/12/2019 1601 Last data filed at 09/12/2019 1600 Gross per 24 hour  Intake 904.51 ml  Output 906 ml  Net -1.49  ml    LBM: Last BM Date: 09/10/19 Baseline Weight: Weight: (!) 149.7 kg Most recent weight: Weight: (!) 163.3 kg     Palliative Assessment/Data:   Flowsheet Rows     Most Recent Value  Intake Tab  Referral Department Critical care  Unit at Time of Referral ICU  Date Notified 09/11/19  Palliative Care Type New Palliative care  Reason for referral Clarify Goals of Care  Date of Admission 09/08/19  # of days IP prior to Palliative referral 3  Clinical Assessment  Psychosocial & Spiritual Assessment  Palliative Care Outcomes      Time In: 12:30 Time Out: 1:40 Time Total: 70 min Greater than 50%  of this time was spent counseling and coordinating care related to the above assessment and plan.  Signed by: Asencion Gowda, NP   Please contact Palliative Medicine Team phone at [phone] for questions and concerns.  For individual provider: See Shea Evans

## 2019-09-12 NOTE — Progress Notes (Signed)
OT Cancellation Note  Patient Details Name: Andrew Valentine MRN: [id number] DOB: 07/24/1940   Cancelled Treatment:    Reason Eval/Treat Not Completed: Medical issues which prohibited therapy  Upon chart review, pt still on CRRT. Will continue to follow for opportunity to initiate occupational therapy services as appropriate. Thank you.    Gerrianne Scale, [place], OTR/L ascom [phone] 09/12/19, 2:48 PM

## 2019-09-12 NOTE — Consult Note (Signed)
ANTICOAGULATION CONSULT NOTE   Pharmacy Consult for heparin dosing  Indication: chest pain/ACS  Patient Measurements: Height: 5\' 8"  (172.7 cm) Weight: (!) 163.3 kg (360 lb 0.2 oz) IBW/kg (Calculated) : 68.4 Heparin Dosing Weight: 104.8  Vital Signs: Temp: 97.6 F (36.4 C) (09/01 1600) Temp Source: Oral (09/01 1600) BP: 119/65 (09/01 1800) Pulse Rate: 64 (09/01 1800)  Labs: Recent Labs    09/10/19 0409 09/10/19 0409 09/11/19 0433 09/11/19 1440 09/11/19 2325 09/12/19 0419 09/12/19 0711 09/12/19 1807  HGB 10.3*   < > 10.0*  --   --   --  8.8*  --   HCT 34.1*  --  31.7*  --   --   --  28.8*  --   PLT 156  --  137*  --   --   --  129*  --   HEPARINUNFRC 0.35   < > 0.28*   < > 0.59  --  0.79* 0.80*  CREATININE 3.67*  --  2.05*  --   --  1.69*  --   --    < > = values in this interval not displayed.    Estimated Creatinine Clearance: 53.3 mL/min (A) (by C-G formula based on SCr of 1.69 mg/dL (H)).   Medical History: Past Medical History:  Diagnosis Date   Congestive heart failure (CHF) ([facility])    Hypertension    Hypothyroidism    Morbid obesity ([place])    Obesity hypoventilation syndrome ([facility])    Type 2 diabetes mellitus ([facility])     Medications:  Scheduled:   amoxicillin-clavulanate  1 tablet Oral Q12H   azithromycin  250 mg Oral Daily   Chlorhexidine Gluconate Cloth  6 each Topical Daily   hydrocortisone sod succinate (SOLU-CORTEF) inj  100 mg Intravenous Q12H   insulin aspart  0-15 Units Subcutaneous Q4H   ipratropium-albuterol  3 mL Nebulization Q6H   levothyroxine  175 mcg Oral Daily   mouth rinse  15 mL Mouth Rinse BID   midodrine  10 mg Oral TID WC   nystatin cream   Topical BID   pantoprazole (PROTONIX) IV  40 mg Intravenous Q24H   senna  1 tablet Oral BID   zinc oxide  1 application Topical Daily    Assessment: 79 year old male presenting to the ED with AMS, hypoglycemia, and hypotension started on a heparin infusion for atrial  fibrillation (onset unclear) being followed by cardiology. H&H, platelets have been trending down since admission  Goal of Therapy:  Heparin level 0.3-0.7 units/ml Monitor platelets by anticoagulation protocol: Yes   Plan:   Anti-Xa level supra-therapeutic (9/1 at 1807 HL = 0.80):   Decrease heparin infusion rate to 1500 units/hr  Re-check anti-Xa level in 8 hours after rate change   Check CBC daily while on heparin infusion per protocol  Benita Gutter 09/12/2019 6:45 PM

## 2019-09-12 NOTE — Progress Notes (Signed)
CRRT bath changed to 4K per Dr Juleen China.  Serum K 3.7.

## 2019-09-12 NOTE — Progress Notes (Signed)
PT Cancellation Note  Patient Details Name: Andrew Valentine MRN: [id number] DOB: 1940-07-04   Cancelled Treatment:    Reason Eval/Treat Not Completed: Medical issues which prohibited therapy: Pt remains on CRRT and somnolent per MD note.  Will attempt to see pt at a future date/time as medically appropriate.     Linus Salmons PT, DPT 09/12/19, 4:21 PM

## 2019-09-12 NOTE — Consult Note (Signed)
ANTICOAGULATION CONSULT NOTE   Pharmacy Consult for heparin dosing  Indication: chest pain/ACS  Patient Measurements: Height: 5\' 8"  (172.7 cm) Weight: (!) 163.3 kg (360 lb 0.2 oz) IBW/kg (Calculated) : 68.4 Heparin Dosing Weight: 104.8  Vital Signs: Temp: 97.9 F (36.6 C) (09/01 0400) Temp Source: Oral (09/01 0400) BP: 117/46 (09/01 0600) Pulse Rate: 73 (09/01 0700)  Labs: Recent Labs    09/09/19 1004 09/09/19 1218 09/10/19 0409 09/10/19 0409 09/11/19 0433 09/11/19 1440 09/11/19 2325 09/12/19 0419  HGB  --   --  10.3*  --  10.0*  --   --   --   HCT  --   --  34.1*  --  31.7*  --   --   --   PLT  --   --  156  --  137*  --   --   --   HEPARINUNFRC  --    < > 0.35   < > 0.28* 0.30 0.59  --   CREATININE  --    < > 3.67*  --  2.05*  --   --  1.69*  TROPONINIHS 2,333*  --   --   --   --   --   --   --    < > = values in this interval not displayed.    Estimated Creatinine Clearance: 53.3 mL/min (A) (by C-G formula based on SCr of 1.69 mg/dL (H)).   Medical History: Past Medical History:  Diagnosis Date  . Congestive heart failure (CHF) ([place])   . Hypertension   . Hypothyroidism   . Morbid obesity ([place])   . Obesity hypoventilation syndrome ([place])   . Type 2 diabetes mellitus ([facility])     Medications:  Scheduled:  . azithromycin  250 mg Oral Daily  . Chlorhexidine Gluconate Cloth  6 each Topical Daily  . hydrocortisone sod succinate (SOLU-CORTEF) inj  100 mg Intravenous Q12H  . insulin aspart  0-15 Units Subcutaneous Q4H  . ipratropium-albuterol  3 mL Nebulization Q6H  . levothyroxine  175 mcg Oral Daily  . mouth rinse  15 mL Mouth Rinse BID  . midodrine  10 mg Oral TID WC  . pantoprazole (PROTONIX) IV  40 mg Intravenous Q24H  . senna  1 tablet Oral BID  . zinc oxide  1 application Topical Daily    Assessment: 79 year old male presenting to the ED with AMS, hypoglycemia, and hypotension started on a heparin infusion for atrial fibrillation (onset unclear) being  followed by cardiology. H&H, platelets have been trending down since admission  Goal of Therapy:  Heparin level 0.3-0.7 units/ml Monitor platelets by anticoagulation protocol: Yes   Plan:   Anti-Xa level supra-therapeutic:   Decrease heparin infusion rate to 1600 units/hr  Re-check anti-Xa level in 8 hours after rate change   Check CBC daily while on heparin infusion per protocol  Vallery Sa, PharmD Clinical Pharmacist   09/12/2019 7:27 AM

## 2019-09-12 NOTE — Progress Notes (Signed)
[facility]  ROUNDING NOTE   Subjective:   On CRRT. Requiring norepinephrine.    Afebrile  Objective:  Vital signs in last 24 hours:  Temp:  [97.3 F (36.3 C)-98.1 F (36.7 C)] 98.1 F (36.7 C) (09/01 1200) Pulse Rate:  [45-81] 67 (09/01 1200) Resp:  [13-18] 16 (09/01 1200) BP: (103-144)/(46-88) 121/62 (09/01 1200) SpO2:  [91 %-100 %] 96 % (09/01 1200) FiO2 (%):  [40 %] 40 % (09/01 0646) Weight:  [163.3 kg] 163.3 kg (09/01 0419)  Weight change: 0.3 kg Filed Weights   09/10/19 0500 09/11/19 0451 09/12/19 0419  Weight: (!) 162.3 kg (!) 163 kg (!) 163.3 kg    Intake/Output: I/O last 3 completed shifts: In: 1775.9 [P.O.:120; I.V.:1090.7; Other:65; IV Piggyback:500.2] Out: 1864 [Urine:443; Other:1421]   Intake/Output this shift:  Total I/O In: 184.8 [P.O.:40; I.V.:119.8; Other:25] Out: 167 [Urine:45; Other:122]  Physical Exam: General: Critically ill  Head: Normocephalic, atraumatic. Moist oral mucosal membranes  Eyes: Anicteric, PERRL  Neck: Supple, trachea midline, obese  Lungs:  Diminished bilaterally 3L [place] O2  Heart: Regular rate and rhythm  Abdomen:  Soft, nontender, obese  Extremities: ++ peripheral edema.  Neurologic: Nonfocal, moving all four extremities, answering questions intermittently.   Skin: No lesions  Access: LIJ temp HD catheter 8/29 ICU    Basic Metabolic Panel: Recent Labs  Lab 09/09/19 0329 09/09/19 0329 09/09/19 1004 09/09/19 1613 09/09/19 1613 09/10/19 0409 09/11/19 0433 09/12/19 0419  NA 139  --   --  141  --  139 136 135  K 5.3*   < > 5.7* 6.0*  --  5.2* 4.2 3.7  CL 109  --   --  112*  --  110 105 104  CO2 22  --   --  20*  --  25 25 27   GLUCOSE 86  --   --  115*  --  136* 158* 154*  BUN 94*  --   --  95*  --  82* 51* 42*  CREATININE 4.49*  --   --  4.44*  --  3.67* 2.05* 1.69*  CALCIUM 7.7*   < >  --  8.2*   < > 7.9* 8.4* 8.9  MG 2.2  --   --  2.2  --  2.2 2.0 2.0  PHOS 7.9*  --   --  7.6*  --   --  3.8 2.9    < > = values in this interval not displayed.    Liver Function Tests: Recent Labs  Lab 09/08/19 1205 09/09/19 0329  AST 25 48*  ALT 16 19  ALKPHOS 89 88  BILITOT 1.5* 1.3*  PROT 7.7 7.2  ALBUMIN 3.0* 2.7*   No results for input(s): LIPASE, AMYLASE in the last 168 hours. No results for input(s): AMMONIA in the last 168 hours.  CBC: Recent Labs  Lab 09/08/19 1205 09/09/19 0329 09/10/19 0409 09/11/19 0433 09/12/19 0711  WBC 10.8* 13.0* 11.5* 11.4* 11.8*  NEUTROABS 8.9*  --   --   --   --   HGB 11.2* 10.7* 10.3* 10.0* 8.8*  HCT 37.7* 36.3* 34.1* 31.7* 28.8*  MCV 104.1* 104.3* 104.0* 99.4 103.6*  PLT 174 186 156 137* 129*    Cardiac Enzymes: Recent Labs  Lab 09/09/19 0329  CKTOTAL 2,443*    BNP: Invalid input(s): POCBNP  CBG: Recent Labs  Lab 09/11/19 1929 09/11/19 2321 09/12/19 0324 09/12/19 0718 09/12/19 1107  GLUCAP 159* 147* 149* 157* 144*    Microbiology: Results  for orders placed or performed during the hospital encounter of 09/08/19  MRSA PCR Screening     Status: None   Collection Time: 09/08/19 11:49 AM   Specimen: Nasopharyngeal  Result Value Ref Range Status   MRSA by PCR NEGATIVE NEGATIVE Final    Comment:        The GeneXpert MRSA Assay (FDA approved for NASAL specimens only), is one component of a comprehensive MRSA colonization surveillance program. It is not intended to diagnose MRSA infection nor to guide or monitor treatment for MRSA infections. Performed at [facility], [address] [place], [place] [address]   SARS Coronavirus 2 by RT PCR (hospital order, performed in [facility] hospital lab) Nasopharyngeal Nasopharyngeal Swab     Status: None   Collection Time: 09/08/19 12:05 PM   Specimen: Nasopharyngeal Swab  Result Value Ref Range Status   SARS Coronavirus 2 NEGATIVE NEGATIVE Final    Comment: (NOTE) SARS-CoV-2 target nucleic acids are NOT DETECTED.  The SARS-CoV-2 RNA is generally detectable in upper  and lower respiratory specimens during the acute phase of infection. The lowest concentration of SARS-CoV-2 viral copies this assay can detect is 250 copies / mL. A negative result does not preclude SARS-CoV-2 infection and should not be used as the sole basis for treatment or other patient management decisions.  A negative result may occur with improper specimen collection / handling, submission of specimen other than nasopharyngeal swab, presence of viral mutation(s) within the areas targeted by this assay, and inadequate number of viral copies (<250 copies / mL). A negative result must be combined with clinical observations, patient history, and epidemiological information.  Fact Sheet for Patients:   StrictlyIdeas.no  Fact Sheet for Healthcare Providers: BankingDealers.co.za  This test is not yet approved or  cleared by the [place] FDA and has been authorized for detection and/or diagnosis of SARS-CoV-2 by FDA under an Emergency Use Authorization (EUA).  This EUA will remain in effect (meaning this test can be used) for the duration of the COVID-19 declaration under Section 564(b)(1) of the Act, 21 U.S.C. section 360bbb-3(b)(1), unless the authorization is terminated or revoked sooner.  Performed at [facility], [address] [place], [place] [address]   Blood culture (routine x 2)     Status: Abnormal   Collection Time: 09/08/19 12:07 PM   Specimen: BLOOD  Result Value Ref Range Status   Specimen Description   Final    BLOOD L DISTAL FOREARM Performed at [facility], [address] [place], [place] [address]    Special Requests   Final    BOTTLES DRAWN AEROBIC AND ANAEROBIC Blood Culture results may not be optimal due to an excessive volume of blood received in culture bottles Performed at [facility], [address] [place], [place] [address]    Culture  Setup Time   Final    GRAM  POSITIVE COCCI AEROBIC BOTTLE ONLY CRITICAL RESULT CALLED TO, READ BACK BY AND VERIFIED WITH: MORGAN CUNNINGHAM  AT 2585 ON 09/09/19 SNG    Culture (A)  Final    STAPHYLOCOCCUS EPIDERMIDIS THE SIGNIFICANCE OF ISOLATING THIS ORGANISM FROM A SINGLE SET OF BLOOD CULTURES WHEN MULTIPLE SETS ARE DRAWN IS UNCERTAIN. PLEASE NOTIFY THE MICROBIOLOGY DEPARTMENT WITHIN ONE WEEK IF SPECIATION AND SENSITIVITIES ARE REQUIRED. Performed at [facility], [place] [address] [place], [place] [address]    Report Status 09/11/2019 FINAL  Final  Blood Culture ID Panel (Reflexed)  Status: Abnormal   Collection Time: 09/08/19 12:07 PM  Result Value Ref Range Status   Enterococcus faecalis NOT DETECTED NOT DETECTED Final   Enterococcus Faecium NOT DETECTED NOT DETECTED Final   Listeria monocytogenes NOT DETECTED NOT DETECTED Final   Staphylococcus species DETECTED (A) NOT DETECTED Final    Comment: CRITICAL RESULT CALLED TO, READ BACK BY AND VERIFIED WITH: MORGAN CUNNINGHAM AT 1345 ON 09/09/19 SNG    Staphylococcus aureus (BCID) NOT DETECTED NOT DETECTED Final   Staphylococcus epidermidis DETECTED (A) NOT DETECTED Final    Comment: CRITICAL RESULT CALLED TO, READ BACK BY AND VERIFIED WITH: MORGAN CUNNINGHAM AT 1345 ON 09/09/19 SNG    Staphylococcus lugdunensis NOT DETECTED NOT DETECTED Final   Streptococcus species NOT DETECTED NOT DETECTED Final   Streptococcus agalactiae NOT DETECTED NOT DETECTED Final   Streptococcus pneumoniae NOT DETECTED NOT DETECTED Final   Streptococcus pyogenes NOT DETECTED NOT DETECTED Final   A.calcoaceticus-baumannii NOT DETECTED NOT DETECTED Final   Bacteroides fragilis NOT DETECTED NOT DETECTED Final   Enterobacterales NOT DETECTED NOT DETECTED Final   Enterobacter cloacae complex NOT DETECTED NOT DETECTED Final   Escherichia coli NOT DETECTED NOT DETECTED Final   Klebsiella aerogenes NOT DETECTED NOT DETECTED Final   Klebsiella oxytoca NOT DETECTED NOT DETECTED Final    Klebsiella pneumoniae NOT DETECTED NOT DETECTED Final   Proteus species NOT DETECTED NOT DETECTED Final   Salmonella species NOT DETECTED NOT DETECTED Final   Serratia marcescens NOT DETECTED NOT DETECTED Final   Haemophilus influenzae NOT DETECTED NOT DETECTED Final   Neisseria meningitidis NOT DETECTED NOT DETECTED Final   Pseudomonas aeruginosa NOT DETECTED NOT DETECTED Final   Stenotrophomonas maltophilia NOT DETECTED NOT DETECTED Final   Candida albicans NOT DETECTED NOT DETECTED Final   Candida auris NOT DETECTED NOT DETECTED Final   Candida glabrata NOT DETECTED NOT DETECTED Final   Candida krusei NOT DETECTED NOT DETECTED Final   Candida parapsilosis NOT DETECTED NOT DETECTED Final   Candida tropicalis NOT DETECTED NOT DETECTED Final   Cryptococcus neoformans/gattii NOT DETECTED NOT DETECTED Final   Methicillin resistance mecA/C NOT DETECTED NOT DETECTED Final    Comment: Performed at [facility], [address] [place], [place] [address]  Blood culture (routine x 2)     Status: None (Preliminary result)   Collection Time: 09/08/19 12:59 PM   Specimen: BLOOD  Result Value Ref Range Status   Specimen Description BLOOD LEFT UPPER FORARM  Final   Special Requests   Final    BOTTLES DRAWN AEROBIC AND ANAEROBIC Blood Culture results may not be optimal due to an excessive volume of blood received in culture bottles   Culture   Final    NO GROWTH 4 DAYS Performed at [facility], [address] [place], [place] [address]    Report Status PENDING  Incomplete    Coagulation Studies: No results for input(s): LABPROT, INR in the last 72 hours.  Urinalysis: No results for input(s): COLORURINE, LABSPEC, PHURINE, GLUCOSEU, HGBUR, BILIRUBINUR, KETONESUR, PROTEINUR, UROBILINOGEN, NITRITE, LEUKOCYTESUR in the last 72 hours.  Invalid input(s): APPERANCEUR    Imaging: DG Chest Port 1 View  Result Date: 09/11/2019 CLINICAL DATA:  Shortness of breath. EXAM:  PORTABLE CHEST 1 VIEW COMPARISON:  Chest radiograph dated 09/09/2019 FINDINGS: The heart is enlarged. A left internal jugular central venous catheter tip overlies the superior vena cava. Moderate to severe bilateral interstitial and airspace opacities appear slightly increased since prior exam. A left pleural effusion likely  contributes. There is no definite right pleural effusion. There is no pneumothorax. IMPRESSION: Moderate to severe bilateral interstitial and airspace opacities appear slightly increased since prior exam. A left pleural effusion likely contributes. Electronically Signed   By: Zerita Boers M.D.   On: 09/11/2019 14:22     Medications:     prismasol BGK 4/2.5      prismasol BGK 4/2.5     heparin 1,600 Units/hr (09/12/19 1200)   norepinephrine (LEVOPHED) Adult infusion 7 mcg/min (09/12/19 1200)   prismasol BGK 4/2.5     vasopressin Stopped (09/11/19 0700)    amoxicillin-clavulanate  1 tablet Oral Q12H   azithromycin  250 mg Oral Daily   Chlorhexidine Gluconate Cloth  6 each Topical Daily   hydrocortisone sod succinate (SOLU-CORTEF) inj  100 mg Intravenous Q12H   insulin aspart  0-15 Units Subcutaneous Q4H   ipratropium-albuterol  3 mL Nebulization Q6H   levothyroxine  175 mcg Oral Daily   mouth rinse  15 mL Mouth Rinse BID   midodrine  10 mg Oral TID WC   nystatin cream   Topical BID   pantoprazole (PROTONIX) IV  40 mg Intravenous Q24H   senna  1 tablet Oral BID   zinc oxide  1 application Topical Daily   acetaminophen **OR** acetaminophen, heparin, heparin sodium (porcine), ondansetron **OR** ondansetron (ZOFRAN) IV, polyethylene glycol, sodium chloride  Assessment/ Plan:  Mr. Andrew Valentine is a 79 y.o. 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 Acute renal failure with hyperkalemia: on chronic kidney disease stage IIIB. Creatinine baseline reported to be 1.5 by son.  Nonoliguric urine output.  - requiring renal replacement therapy. Will monitor, if more stable, will transition to intermittent hemodialysis treatment.  - Recommend tunneled dialysis catheter placement. Will consult vascular.   2. Hypotension: requiring vasopressors - norepinephrine. With cardiogenic and septic shock from pneumonia - Appreciate cardiology input.  - empiric antibiotics: Augmentin  3. Acute respiratory failure with respiratory acidosis: with acute exacerbation of chronic diastolic congestive heart failure  - Appreciate pulmonary input.    LOS: 4 Judee Hennick 9/1/202112:21 PM

## 2019-09-12 NOTE — Progress Notes (Signed)
Progress Note  Patient Name: Andrew Valentine Date of Encounter: 09/12/2019  [facility] Cardiologist: New to [facility]- Dr. Rockey Situ  Subjective   No acute events overnight, somnolent today.  Was seen by speech therapy earlier, currently on dysphagia diet.  Not much of an appetite.  Inpatient Medications    Scheduled Meds: . amoxicillin-clavulanate  1 tablet Oral Q12H  . azithromycin  250 mg Oral Daily  . Chlorhexidine Gluconate Cloth  6 each Topical Daily  . hydrocortisone sod succinate (SOLU-CORTEF) inj  100 mg Intravenous Q12H  . insulin aspart  0-15 Units Subcutaneous Q4H  . ipratropium-albuterol  3 mL Nebulization Q6H  . levothyroxine  175 mcg Oral Daily  . mouth rinse  15 mL Mouth Rinse BID  . midodrine  10 mg Oral TID WC  . nystatin cream   Topical BID  . pantoprazole (PROTONIX) IV  40 mg Intravenous Q24H  . senna  1 tablet Oral BID  . zinc oxide  1 application Topical Daily   Continuous Infusions: .  prismasol BGK 4/2.5    .  prismasol BGK 4/2.5    . heparin 1,600 Units/hr (09/12/19 1200)  . norepinephrine (LEVOPHED) Adult infusion 7 mcg/min (09/12/19 1200)  . prismasol BGK 4/2.5    . vasopressin Stopped (09/11/19 0700)   PRN Meds: acetaminophen **OR** acetaminophen, heparin, heparin sodium (porcine), ondansetron **OR** ondansetron (ZOFRAN) IV, polyethylene glycol, sodium chloride   Vital Signs    Vitals:   09/12/19 0900 09/12/19 1000 09/12/19 1100 09/12/19 1200  BP: 127/61 (!) 109/55 (!) 103/49 121/62  Pulse: 79 81 69 67  Resp: 18 14 15 16   Temp:    98.1 F (36.7 C)  TempSrc:    Axillary  SpO2: 94% 96% 98% 96%  Weight:      Height:        Intake/Output Summary (Last 24 hours) at 09/12/2019 1212 Last data filed at 09/12/2019 1200 Gross per 24 hour  Intake 1012.9 ml  Output 978 ml  Net 34.9 ml   Last 3 Weights 09/12/2019 09/11/2019 09/10/2019  Weight (lbs) 360 lb 0.2 oz 359 lb 5.6 oz 357 lb 12.9 oz  Weight (kg) 163.3 kg 163 kg 162.3 kg      Telemetry    A.  fib, heart rate 70- Personally Reviewed  ECG    No new tracing obtained- Personally Reviewed  Physical Exam   GEN:  Somnolent, barely arousable Neck: No JVD Cardiac:  Irregular irregular Respiratory:  Decreased breath sounds at bases GI: Soft, nontender, distended  MS: No edema; No deformity. Neuro:  Nonfocal  Psych: Difficult to assess  Labs    High Sensitivity Troponin:   Recent Labs  Lab 09/08/19 1205 09/08/19 1607 09/09/19 0607 09/09/19 1004  TROPONINIHS 181* 445* 1,541* 2,333*      Chemistry Recent Labs  Lab 09/08/19 1205 09/08/19 1205 09/09/19 0329 09/09/19 1004 09/10/19 0409 09/11/19 0433 09/12/19 0419  NA 140   < > 139   < > 139 136 135  K 5.3*   < > 5.3*   < > 5.2* 4.2 3.7  CL 108   < > 109   < > 110 105 104  CO2 24   < > 22   < > 25 25 27   GLUCOSE 88   < > 86   < > 136* 158* 154*  BUN 94*   < > 94*   < > 82* 51* 42*  CREATININE 4.47*   < > 4.49*   < >  3.67* 2.05* 1.69*  CALCIUM 8.0*   < > 7.7*   < > 7.9* 8.4* 8.9  PROT 7.7  --  7.2  --   --   --   --   ALBUMIN 3.0*  --  2.7*  --   --   --   --   AST 25  --  48*  --   --   --   --   ALT 16  --  19  --   --   --   --   ALKPHOS 89  --  88  --   --   --   --   BILITOT 1.5*  --  1.3*  --   --   --   --   GFRNONAA 12*   < > 12*   < > 15* 30* 38*  GFRAA 14*   < > 13*   < > 17* 35* 44*  ANIONGAP 8   < > 8   < > 4* 6 4*   < > = values in this interval not displayed.     Hematology Recent Labs  Lab 09/10/19 0409 09/11/19 0433 09/12/19 0711  WBC 11.5* 11.4* 11.8*  RBC 3.28* 3.19* 2.78*  HGB 10.3* 10.0* 8.8*  HCT 34.1* 31.7* 28.8*  MCV 104.0* 99.4 103.6*  MCH 31.4 31.3 31.7  MCHC 30.2 31.5 30.6  RDW 17.7* 17.5* 17.2*  PLT 156 137* 129*    BNP Recent Labs  Lab 09/08/19 1205  BNP 280.3*     DDimer No results for input(s): DDIMER in the last 168 hours.   Radiology    DG Chest Port 1 View  Result Date: 09/11/2019 CLINICAL DATA:  Shortness of breath. EXAM: PORTABLE CHEST 1 VIEW  COMPARISON:  Chest radiograph dated 09/09/2019 FINDINGS: The heart is enlarged. A left internal jugular central venous catheter tip overlies the superior vena cava. Moderate to severe bilateral interstitial and airspace opacities appear slightly increased since prior exam. A left pleural effusion likely contributes. There is no definite right pleural effusion. There is no pneumothorax. IMPRESSION: Moderate to severe bilateral interstitial and airspace opacities appear slightly increased since prior exam. A left pleural effusion likely contributes. Electronically Signed   By: Zerita Boers M.D.   On: 09/11/2019 14:22    Cardiac Studies   TTE 09/09/2019 1. Left ventricular ejection fraction, by estimation, is 55 to 60%. The  left ventricle has normal function. The left ventricle has no regional  wall motion abnormalities. Left ventricular diastolic parameters are  indeterminate. Septal flattening in  systole consistent with elevated right heart pressures.  2. Right ventricular systolic function was not well visualized. The right  ventricular size is moderately enlarged. There is severely elevated  pulmonary artery systolic pressure. The estimated right ventricular  systolic pressure is 35.0 mmHg.  3. Left atrial size was mildly dilated.  Patient Profile     79 y.o. male with history of obesity, sleep apnea noncompliant with CPAP, obesity hypoventilation syndrome presenting with hyperglycemia, hypoxia and hypotension being seen for A. fib.  Assessment & Plan    1.  Persistent atrial fibrillation -Heart rate control off AV nodal agents. -On heparin drip. -Monitor H&H closely while on anticoagulation.  Hemoglobin dropped from 10 to 8 as of this am from yesterday.  2.  Respiratory distress, OSA, somnolence -BiPAP as per ICU team. -Being giving steroids.  3.  CKD -Currently on CRRT. -Nephrology following  4.  Hypotension -On midodrine -On Levophed.  Titrate off pressors as blood  pressure permits.   Patient is critically ill with multiple medical/comorbid conditions contributing to his current state.  Requiring IV medication/pressors for blood pressure support.  Mentation is also not optimal.  Total encounter time 35 minutes  Greater than 50% was spent in counseling and coordination of care with the patient      Signed, Kate Sable, MD  09/12/2019, 12:12 PM

## 2019-09-12 NOTE — Consult Note (Signed)
ANTICOAGULATION CONSULT NOTE   Pharmacy Consult for heparin dosing  Indication: chest pain/ACS  Patient Measurements: Height: 5\' 8"  (172.7 cm) Weight: (!) 163 kg (359 lb 5.6 oz) IBW/kg (Calculated) : 68.4 Heparin Dosing Weight: 104.8  Vital Signs: Temp: 98 F (36.7 C) (09/01 0000) Temp Source: Oral (09/01 0000) BP: 135/66 (09/01 0100) Pulse Rate: 77 (09/01 0100)  Labs: Recent Labs     0000 09/09/19 0329 09/09/19 0607 09/09/19 1004 09/09/19 1218 09/09/19 1613 09/10/19 0409 09/10/19 0409 09/11/19 0433 09/11/19 1440 09/11/19 2325  HGB   < > 10.7*  --   --   --   --  10.3*  --  10.0*  --   --   HCT  --  36.3*  --   --   --   --  34.1*  --  31.7*  --   --   PLT  --  186  --   --   --   --  156  --  137*  --   --   HEPARINUNFRC  --   --   --   --    < >  --  0.35   < > 0.28* 0.30 0.59  CREATININE   < > 4.49*  --   --   --  4.44* 3.67*  --  2.05*  --   --   CKTOTAL  --  2,443*  --   --   --   --   --   --   --   --   --   TROPONINIHS  --   --  1,541* 2,333*  --   --   --   --   --   --   --    < > = values in this interval not displayed.    Estimated Creatinine Clearance: 43.9 mL/min (A) (by C-G formula based on SCr of 2.05 mg/dL (H)).   Medical History: Past Medical History:  Diagnosis Date  . Congestive heart failure (CHF) ([place])   . Hypertension   . Hypothyroidism   . Morbid obesity ([place])   . Obesity hypoventilation syndrome ([place])   . Type 2 diabetes mellitus (H[facility])     Medications:  Scheduled:  . azithromycin  250 mg Oral Daily  . Chlorhexidine Gluconate Cloth  6 each Topical Daily  . hydrocortisone sod succinate (SOLU-CORTEF) inj  100 mg Intravenous Q12H  . insulin aspart  0-15 Units Subcutaneous Q4H  . ipratropium-albuterol  3 mL Nebulization Q6H  . levothyroxine  175 mcg Oral Daily  . mouth rinse  15 mL Mouth Rinse BID  . midodrine  10 mg Oral TID WC  . pantoprazole (PROTONIX) IV  40 mg Intravenous Q24H  . senna  1 tablet Oral BID  . zinc oxide  1  application Topical Daily    Assessment: 79 year old male presenting to the ED with AMS, hypoglycemia, and hypotension started on a heparin infusion for atrial fibrillation (onset unclear) being followed by cardiology. H&H, platelets have been trending down since admission  Goal of Therapy:  Heparin level 0.3-0.7 units/ml Monitor platelets by anticoagulation protocol: Yes   Plan:   Anti-Xa level therapeutic: 0831 @ 2325 HL 0.59  Continue heparin infusion rate at 1800 units/hr  Re-check anti-Xa level in 8 hours   Check CBC daily while on heparin infusion per protocol  Hart Robinsons, PharmD Clinical Pharmacist   09/12/2019 1:57 AM

## 2019-09-13 ENCOUNTER — Other Ambulatory Visit (INDEPENDENT_AMBULATORY_CARE_PROVIDER_SITE_OTHER): Payer: Self-pay | Admitting: Vascular Surgery

## 2019-09-13 DIAGNOSIS — Z515 Encounter for palliative care: Secondary | ICD-10-CM

## 2019-09-13 DIAGNOSIS — Z7189 Other specified counseling: Secondary | ICD-10-CM

## 2019-09-13 DIAGNOSIS — Z452 Encounter for adjustment and management of vascular access device: Secondary | ICD-10-CM

## 2019-09-13 DIAGNOSIS — I272 Pulmonary hypertension, unspecified: Secondary | ICD-10-CM

## 2019-09-13 LAB — CULTURE, BLOOD (ROUTINE X 2): Culture: NO GROWTH

## 2019-09-13 LAB — BASIC METABOLIC PANEL
Anion gap: 4 — ABNORMAL LOW (ref 5–15)
BUN: 45 mg/dL — ABNORMAL HIGH (ref 8–23)
CO2: 26 mmol/L (ref 22–32)
Calcium: 8.4 mg/dL — ABNORMAL LOW (ref 8.9–10.3)
Chloride: 104 mmol/L (ref 98–111)
Creatinine, Ser: 1.54 mg/dL — ABNORMAL HIGH (ref 0.61–1.24)
GFR calc Af Amer: 49 mL/min — ABNORMAL LOW (ref >60)
GFR calc non Af Amer: 42 mL/min — ABNORMAL LOW (ref >60)
Glucose, Bld: 147 mg/dL — ABNORMAL HIGH (ref 70–99)
Potassium: 4.3 mmol/L (ref 3.5–5.1)
Sodium: 134 mmol/L — ABNORMAL LOW (ref 135–145)

## 2019-09-13 LAB — CBC
HCT: 29.5 % — ABNORMAL LOW (ref 39.0–52.0)
Hemoglobin: 8.7 g/dL — ABNORMAL LOW (ref 13.0–17.0)
MCH: 31 pg (ref 26.0–34.0)
MCHC: 29.5 g/dL — ABNORMAL LOW (ref 30.0–36.0)
MCV: 105 fL — ABNORMAL HIGH (ref 80.0–100.0)
Platelets: 140 10*3/uL — ABNORMAL LOW (ref 150–400)
RBC: 2.81 MIL/uL — ABNORMAL LOW (ref 4.22–5.81)
RDW: 17.2 % — ABNORMAL HIGH (ref 11.5–15.5)
WBC: 11.9 10*3/uL — ABNORMAL HIGH (ref 4.0–10.5)
nRBC: 0.6 % — ABNORMAL HIGH (ref 0.0–0.2)

## 2019-09-13 LAB — HEPARIN LEVEL (UNFRACTIONATED)
Heparin Unfractionated: 0.62 IU/mL (ref 0.30–0.70)
Heparin Unfractionated: 0.26 IU/mL — ABNORMAL LOW (ref 0.30–0.70)

## 2019-09-13 LAB — GLUCOSE, CAPILLARY
Glucose-Capillary: 138 mg/dL — ABNORMAL HIGH (ref 70–99)
Glucose-Capillary: 135 mg/dL — ABNORMAL HIGH (ref 70–99)
Glucose-Capillary: 143 mg/dL — ABNORMAL HIGH (ref 70–99)
Glucose-Capillary: 134 mg/dL — ABNORMAL HIGH (ref 70–99)
Glucose-Capillary: 130 mg/dL — ABNORMAL HIGH (ref 70–99)

## 2019-09-13 LAB — MAGNESIUM: Magnesium: 2.1 mg/dL (ref 1.7–2.4)

## 2019-09-13 LAB — PHOSPHORUS: Phosphorus: 2.3 mg/dL — ABNORMAL LOW (ref 2.5–4.6)

## 2019-09-13 MED ORDER — FLUDROCORTISONE ACETATE 0.1 MG PO TABS
0.2000 mg | ORAL_TABLET | Freq: Two times a day (BID) | ORAL | Status: DC
  Administered 2019-09-13 – 2019-09-25 (×21): 0.2 mg via ORAL
  Filled 2019-09-13 (×26): qty 2

## 2019-09-13 MED ORDER — ALBUMIN HUMAN 25 % IV SOLN
12.5000 g | Freq: Once | INTRAVENOUS | Status: AC
  Administered 2019-09-13: 12.5 g via INTRAVENOUS
  Filled 2019-09-13: qty 50

## 2019-09-13 MED ORDER — SODIUM PHOSPHATES 45 MMOLE/15ML IV SOLN
15.0000 mmol | Freq: Once | INTRAVENOUS | Status: AC
  Administered 2019-09-13: 15 mmol via INTRAVENOUS
  Filled 2019-09-13: qty 5

## 2019-09-13 MED ORDER — FUROSEMIDE 10 MG/ML IJ SOLN
80.0000 mg | Freq: Once | INTRAMUSCULAR | Status: AC
  Administered 2019-09-13: 80 mg via INTRAVENOUS
  Filled 2019-09-13: qty 8

## 2019-09-13 NOTE — Progress Notes (Addendum)
Daily Progress Note   Patient Name: Andrew Valentine       Date: 09/13/2019 DOB: 30-Jun-1940  Age: 79 y.o. MRN#: [id number] Attending Physician: Ottie Glazier, MD Primary Care Physician: [facility] [place] Date: 09/08/2019  Reason for Consultation/Follow-up: Establishing goals of care  Subjective: Patient is resting in bed. He is alert but groggy. His daughter is at bedside. His son is on [employer] who is an ICU RN. They state son has spoken with patient this morning. We discussed his status. Son was very engaged in asking questions for clarification of how patient felt about BIPAP, intubation, CPR, dialysis, and feeding tube. Patient answered questions with yes and no answers. He elaborated on some questions. At times, he was unable to restate questions and said "I don't know" when asked by son what we were talking about, or about previous conversations.  Decisions made by the patient and children for permcath and HD. He would be amenable to a temporary feeding tube if one were ever needed, but not a permanent one. They would want CPR. They would not want ventilator support. We discussed what a scenerio would look like involving death if a ventilator was indicated that was not used if he did not want vent placement. We discussed a scenario of death if CPR produced ROSC without a ventilator following ROSC. Son states they would make decisions and could be called for further conversation if an event involving CPR or a ventilator were to arise.   Son states to him,  patient is more oriented today, but would like to discuss this again tomorrow morning. Remeet at 9:45 tomorrow morning. MOST form completed and signed by daughter.   Entered room to have patient sign MOST form as well. He states  "seeing your children changes your mind. I love both of them, and I feel like I owe it to them".   Length of Stay: 5  Current Medications: Scheduled Meds:  . amoxicillin-clavulanate  1 tablet Oral Q12H  . Chlorhexidine Gluconate Cloth  6 each Topical Daily  . furosemide  80 mg Intravenous Once  . hydrocortisone sod succinate (SOLU-CORTEF) inj  100 mg Intravenous Q12H  . insulin aspart  0-15 Units Subcutaneous Q4H  . ipratropium-albuterol  3 mL Nebulization TID  . levothyroxine  175 mcg Oral Daily  . mouth  rinse  15 mL Mouth Rinse BID  . midodrine  10 mg Oral TID WC  . nystatin cream   Topical BID  . pantoprazole (PROTONIX) IV  40 mg Intravenous Q24H  . senna  1 tablet Oral BID  . zinc oxide  1 application Topical Daily    Continuous Infusions: .  prismasol BGK 4/2.5 Stopped (09/13/19 0630)  .  prismasol BGK 4/2.5 Stopped (09/13/19 0630)  . heparin 1,500 Units/hr (09/13/19 1000)  . norepinephrine (LEVOPHED) Adult infusion 7 mcg/min (09/13/19 1000)  . prismasol BGK 4/2.5 Stopped (09/13/19 0630)  . vasopressin Stopped (09/11/19 0700)    PRN Meds: acetaminophen **OR** acetaminophen, heparin, heparin sodium (porcine), ondansetron **OR** ondansetron (ZOFRAN) IV, polyethylene glycol, sodium chloride  Physical Exam Pulmonary:     Effort: Pulmonary effort is normal.  Neurological:     Mental Status: He is alert.             Vital Signs: BP (!) 142/79   Pulse 81   Temp 98.2 F (36.8 C) (Axillary)   Resp 19   Ht 5\' 8"  (1.727 m)   Wt (!) 163.3 kg   SpO2 95%   BMI 54.74 kg/m  SpO2: SpO2: 95 % O2 Device: O2 Device: Nasal Cannula O2 Flow Rate: O2 Flow Rate (L/min): 3 L/min  Intake/output summary:   Intake/Output Summary (Last 24 hours) at 09/13/2019 1312 Last data filed at 09/13/2019 1000 Gross per 24 hour  Intake 691.19 ml  Output 413 ml  Net 278.19 ml   LBM: Last BM Date: 09/10/19 Baseline Weight: Weight: (!) 149.7 kg Most recent weight: Weight: (!) 163.3 kg         Palliative Assessment/Data:    Flowsheet Rows     Most Recent Value  Intake Tab  Referral Department Critical care  Unit at Time of Referral ICU  Date Notified 09/11/19  Palliative Care Type New Palliative care  Reason for referral Clarify Goals of Care  Date of Admission 09/08/19  # of days IP prior to Palliative referral 3  Clinical Assessment  Psychosocial & Spiritual Assessment  Palliative Care Outcomes      Patient Active Problem List   Diagnosis Date Noted  . Pressure injury of skin 09/09/2019  . Hyperkalemia   . NSTEMI (non-ST elevated myocardial infarction) ([place])   . Acute hypoxemic respiratory failure ([place]) 09/08/2019  . Acute renal failure ([place]) 09/08/2019  . Acute renal failure with tubular necrosis ([place])   . Hypoxia   . Hypotension due to hypovolemia   . Atrial fibrillation [facility])     Palliative Care Assessment & Plan   Recommendations/Plan:  Re-meet tomorrow at 9:45.   DNI.   Do CPR and ACLS besides intubation.  Do HD.    Code Status:    Code Status Orders  (From admission, onward)         Start     Ordered   09/13/19 1312  Limited resuscitation (code)  Continuous       Question Answer Comment  In the event of cardiac or respiratory ARREST: Initiate Code Blue, Call Rapid Response Yes   In the event of cardiac or respiratory ARREST: Perform CPR Yes   In the event of cardiac or respiratory ARREST: Perform Intubation/Mechanical Ventilation No   In the event of cardiac or respiratory ARREST: Use NIPPV/BiPAp only if indicated Yes   In the event of cardiac or respiratory ARREST: Administer ACLS medications if indicated Yes   In the event of cardiac or respiratory  ARREST: Perform Defibrillation or Cardioversion if indicated Yes   Comments MOST form on chart.      09/13/19 1312        Code Status History    Date Active Date Inactive Code Status Order ID Comments User Context   09/12/2019 1623 09/13/2019 1312 DNR [id number]  Asencion Gowda, NP  Inpatient   09/08/2019 1850 09/12/2019 1623 Full Code [id number]  Fritzi Mandes, MD ED   09/08/2019 1845 09/08/2019 1850 Full Code [id number]  Fritzi Mandes, MD ED   09/08/2019 1551 09/08/2019 1845 DNR [id number]  Fritzi Mandes, MD ED   Advance Care Planning Activity       Prognosis:   Poor overall  Care plan was discussed with CCM- NP  Thank you for allowing the Palliative Medicine Team to assist in the care of this patient.   Time In: 12:00 Time Out: 1:40 Total Time 1 hour 40 min Prolonged Time Billed  yes       Greater than 50%  of this time was spent counseling and coordinating care related to the above assessment and plan.  Asencion Gowda, NP  Please contact Palliative Medicine Team phone at [phone] for questions and concerns.

## 2019-09-13 NOTE — Consult Note (Signed)
ANTICOAGULATION CONSULT NOTE   Pharmacy Consult for heparin dosing  Indication: chest pain/ACS  Patient Measurements: Height: 5\' 8"  (172.7 cm) Weight: (!) 163.3 kg (360 lb 0.2 oz) IBW/kg (Calculated) : 68.4 Heparin Dosing Weight: 104.8  Vital Signs: Temp: 97.6 F (36.4 C) (09/02 0400) Temp Source: Axillary (09/02 0400) BP: 113/57 (09/02 0600) Pulse Rate: 72 (09/02 0600)  Labs: Recent Labs    09/11/19 0433 09/11/19 1440 09/11/19 2325 09/12/19 0419 09/12/19 0711 09/12/19 1807 09/13/19 0259  HGB 10.0*  --   --   --  8.8*  --  8.7*  HCT 31.7*  --   --   --  28.8*  --  29.5*  PLT 137*  --   --   --  129*  --  140*  HEPARINUNFRC 0.28*   < >   < >  --  0.79* 0.80* 0.62  CREATININE 2.05*  --   --  1.69*  --   --  1.54*   < > = values in this interval not displayed.    Estimated Creatinine Clearance: 58.5 mL/min (A) (by C-G formula based on SCr of 1.54 mg/dL (H)).   Medical History: Past Medical History:  Diagnosis Date  . Congestive heart failure (CHF) ([place])   . Hypertension   . Hypothyroidism   . Morbid obesity ([place])   . Obesity hypoventilation syndrome ([place])   . Type 2 diabetes mellitus ([facility])     Medications:  Scheduled:  . amoxicillin-clavulanate  1 tablet Oral Q12H  . azithromycin  250 mg Oral Daily  . Chlorhexidine Gluconate Cloth  6 each Topical Daily  . hydrocortisone sod succinate (SOLU-CORTEF) inj  100 mg Intravenous Q12H  . insulin aspart  0-15 Units Subcutaneous Q4H  . ipratropium-albuterol  3 mL Nebulization TID  . levothyroxine  175 mcg Oral Daily  . mouth rinse  15 mL Mouth Rinse BID  . midodrine  10 mg Oral TID WC  . nystatin cream   Topical BID  . pantoprazole (PROTONIX) IV  40 mg Intravenous Q24H  . senna  1 tablet Oral BID  . zinc oxide  1 application Topical Daily   Assessment: 79 year old male presenting to the ED with AMS, hypoglycemia, and hypotension started on a heparin infusion for atrial fibrillation (onset unclear) being followed  by cardiology. H&H, platelets have been trending down since admission. Following the previous note heparin was held for approximately 2 hours  Goal of Therapy:  Heparin level 0.3-0.7 units/ml Monitor platelets by anticoagulation protocol: Yes   Plan:   Heparin was restarted at 1415 at 1500 units/hr  Re-check anti-Xa level in 8 hours   Check CBC daily while on heparin infusion per protocol  Dallie Piles 09/13/2019 7:45 AM

## 2019-09-13 NOTE — Consult Note (Signed)
ANTICOAGULATION CONSULT NOTE   Pharmacy Consult for heparin dosing  Indication: chest pain/ACS  Patient Measurements: Height: 5\' 8"  (172.7 cm) Weight: (!) 163.3 kg (360 lb 0.2 oz) IBW/kg (Calculated) : 68.4 Heparin Dosing Weight: 104.8  Vital Signs: Temp: 97.6 F (36.4 C) (09/02 0400) Temp Source: Axillary (09/02 0400) BP: 111/56 (09/02 0400) Pulse Rate: 75 (09/02 0400)  Labs: Recent Labs    09/11/19 0433 09/11/19 1440 09/11/19 2325 09/12/19 0419 09/12/19 0711 09/12/19 1807 09/13/19 0259  HGB 10.0*  --   --   --  8.8*  --  8.7*  HCT 31.7*  --   --   --  28.8*  --  29.5*  PLT 137*  --   --   --  129*  --  140*  HEPARINUNFRC 0.28*   < >   < >  --  0.79* 0.80* 0.62  CREATININE 2.05*  --   --  1.69*  --   --  1.54*   < > = values in this interval not displayed.    Estimated Creatinine Clearance: 58.5 mL/min (A) (by C-G formula based on SCr of 1.54 mg/dL (H)).   Medical History: Past Medical History:  Diagnosis Date  . Congestive heart failure (CHF) ([place])   . Hypertension   . Hypothyroidism   . Morbid obesity ([place])   . Obesity hypoventilation syndrome ([place])   . Type 2 diabetes mellitus ([facility])     Medications:  Scheduled:  . amoxicillin-clavulanate  1 tablet Oral Q12H  . azithromycin  250 mg Oral Daily  . Chlorhexidine Gluconate Cloth  6 each Topical Daily  . hydrocortisone sod succinate (SOLU-CORTEF) inj  100 mg Intravenous Q12H  . insulin aspart  0-15 Units Subcutaneous Q4H  . ipratropium-albuterol  3 mL Nebulization TID  . levothyroxine  175 mcg Oral Daily  . mouth rinse  15 mL Mouth Rinse BID  . midodrine  10 mg Oral TID WC  . nystatin cream   Topical BID  . pantoprazole (PROTONIX) IV  40 mg Intravenous Q24H  . senna  1 tablet Oral BID  . zinc oxide  1 application Topical Daily   Assessment: 79 year old male presenting to the ED with AMS, hypoglycemia, and hypotension started on a heparin infusion for atrial fibrillation (onset unclear) being followed  by cardiology. H&H, platelets have been trending down since admission  Goal of Therapy:  Heparin level 0.3-0.7 units/ml Monitor platelets by anticoagulation protocol: Yes   Plan:   Anti-Xa level therapeutic (9/2 at 0259 HL = 0.62), CBC stable  Continue heparin infusion rate at 1500 units/hr  Re-check anti-Xa level in 8 hours to confirm   Check CBC daily while on heparin infusion per protocol  Hart Robinsons A 09/13/2019 4:24 AM

## 2019-09-13 NOTE — Evaluation (Signed)
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  Pt was on CRRT starting 09/09/19 now off CRRT with plan for PermCath placement 09/14/19.  MD assessment includes: Acute on chronic kidney failure, acute hypoxic respiratory failure, pulmonary HTN, anemia, and hypotension.   Clinical Impression   Pt was seen for OT evaluation this date. Prior to hospital admission, pt reports being MOD I with fxl mobility with 4WW and Mod I with most self care except assist from dtr for shower transfers, drying off, and LB dressing. Pt lives alone in mobile home with ramped entrance which he reports is right next to daughter's residence. Currently pt demonstrates impairments as described below (See OT problem list) which functionally limit his ability to perform ADL/self-care tasks. Pt currently requires MOD A for most aspects of bed level (HOB elevated) UB self care, and MAX to TOTAL A with bed level LB ADLs. Pt requires 3 person (TOTAL) assist to come to EOB sitting and tolerates ~10 mins with F static sitting balance (requires UE support). Unable to attempt standing at this time d/t low tolerance to come to sitting.  Pt would benefit from skilled OT to address noted impairments and functional limitations (see below for any additional details) in order to maximize safety and independence while minimizing falls risk and caregiver burden. Upon hospital discharge, recommend STR to maximize pt safety and return to PLOF.     Follow Up Recommendations  SNF    Equipment Recommendations  Other (comment) (defer to next level of care)    Recommendations for Other  Services       Precautions / Restrictions Precautions Precautions: Fall Restrictions Weight Bearing Restrictions: No Other Position/Activity Restrictions: Watch BP      Mobility Bed Mobility Overal bed mobility: Needs Assistance Bed Mobility: Rolling;Sidelying to Sit;Sit to Sidelying Rolling: +2 for physical assistance;Total assist Sidelying to sit: Total assist;+2 for physical assistance     Sit to sidelying: +2 for physical assistance;Total assist General bed mobility comments: requires 3p assist for safety/posterior support for sidelying to sit. Unable to asjust seated positioning EOB. initially requires MIN/MOD A to sustain static sitting, but ultimtely able to perform with SBA/CGA  Transfers                 General transfer comment: unable/unsafe to attempt    Balance Overall balance assessment: Needs assistance Sitting-balance support: Single extremity supported Sitting balance-Leahy Scale: Fair Sitting balance - Comments: Poor sitting balance initially but progressed to fair during the session, requires use of UEs for support throughout       Standing balance comment: NT                           ADL either performed or assessed with clinical judgement   ADL Overall ADL's : Needs assistance/impaired Eating/Feeding: Moderate assistance;Bed level Eating/Feeding Details (indicate cue type and reason): HOB elevated, supported sitting, MOD A to use b/l UEs to bring hand to mouth with cup of water, MIN A with lid/straw. Grooming: Dance movement psychotherapist;Moderate assistance;Bed level Grooming Details (indicate cue type and reason): based on clinical observation         Upper  Body Dressing : Maximal assistance;Bed level Upper Body Dressing Details (indicate cue type and reason): based on clinical observation Lower Body Dressing: Total assistance;Bed level Lower Body Dressing Details (indicate cue type and reason): pt does make some effort to lift feet to  contribute to task at bed level. Endorses that his dtr has had to help him with LB dressing for some time at baseline.   Toilet Transfer Details (indicate cue type and reason): unable/unsafe Toileting- Clothing Manipulation and Hygiene: Total assistance;+2 for physical assistance;Bed level Toileting - Clothing Manipulation Details (indicate cue type and reason): lateral rolling tehcnique             Vision Patient Visual Report: No change from baseline       Perception     Praxis      Pertinent Vitals/Pain Pain Assessment: No/denies pain     Hand Dominance     Extremity/Trunk Assessment Upper Extremity Assessment Upper Extremity Assessment: RUE deficits/detail;LUE deficits/detail;Generalized weakness (pt reports h/o ligament tears/injuries to b/l shoulders) RUE Deficits / Details: shld flexion to <1/4 range AROM, 3/4 range PROM. Grip MMT grossly 4-/5 LUE Deficits / Details: unable to perform shoudler flexion against gravity, ~1/2 range PROM. Grip MMT grossly 3+/5   Lower Extremity Assessment Lower Extremity Assessment: Defer to PT evaluation RLE Deficits / Details: BLE hip abd/add and hip flex <3/5, ankle strength grossly WFL, knee ext >3/5 LLE Deficits / Details: BLE hip abd/add and hip flex <3/5, ankle strength grossly WFL, knee ext >3/5       Communication Communication Communication: No difficulties   Cognition Arousal/Alertness: Awake/alert Behavior During Therapy: WFL for tasks assessed/performed Overall Cognitive Status: No family/caregiver present to determine baseline cognitive functioning                                 General Comments: oriented to self, some aspects of location (states hospital, [place], but not which hospital), some aspects of situation. Able to appropriately follow verbal cues. Appropriate conversationally.   General Comments       Exercises Other Exercises Other Exercises: OT facilitates ed re: role of OT and  bialteral digit/wrist exercise while UEs elevated on pillows to assist with edema mgt. Pt with moderate reception   Shoulder Instructions      Home Living Family/patient expects to be discharged to:: Private residence Living Arrangements: Alone Available Help at Discharge: Family;Available PRN/intermittently (daughter lives right next door to pt.) Type of Home: Mobile home (double wide) Home Access: Ramped entrance     Home Layout: One level     Bathroom Shower/Tub: Walk-in shower         Home Equipment: Environmental consultant - 4 wheels;Shower seat - built in          Prior Functioning/Environment Level of Independence: Needs assistance  Gait / Transfers Assistance Needed: MOD I with fxl mobility with 4WW, endorses >5 falls in last year ADL's / Homemaking Assistance Needed: Daughter lives next door and assists with ADLs including shower transfer and drying pt off after.            OT Problem List: Decreased strength;Decreased range of motion;Decreased activity tolerance;Impaired balance (sitting and/or standing);Decreased coordination;Decreased knowledge of use of DME or AE;Cardiopulmonary status limiting activity;Impaired UE functional use      OT Treatment/Interventions: Self-care/ADL training;DME and/or AE instruction;Therapeutic activities;Balance training;Therapeutic exercise;Energy conservation;Patient/family education    OT Goals(Current goals can be found in the care plan section) Acute  Rehab OT Goals Patient Stated Goal: To walk again OT Goal Formulation: With patient Time For Goal Achievement: 09/27/19 Potential to Achieve Goals: Fair ADL Goals Pt Will Perform Eating: with set-up;sitting Pt Will Perform Grooming: with set-up;sitting Pt Will Perform Upper Body Dressing: with min assist;sitting Pt/caregiver will Perform Home Exercise Program: Increased ROM;Both right and left upper extremity;With minimal assist Additional ADL Goal #1: Pt will tolerate further mobilization  with therapy (at least transition to sitting with 1p assist or scoot transfers with 2p) to allow further development of OT POC.  OT Frequency: Min 1X/week   Barriers to D/C:            Co-evaluation PT/OT/SLP Co-Evaluation/Treatment: Yes Reason for Co-Treatment: Complexity of the patient's impairments (multi-system involvement);For patient/therapist safety;To address functional/ADL transfers PT goals addressed during session: Mobility/safety with mobility;Balance;Strengthening/ROM OT goals addressed during session: ADL's and self-care;Strengthening/ROM      AM-PAC OT "6 Clicks" Daily Activity     Outcome Measure Help from another person eating meals?: A Lot Help from another person taking care of personal grooming?: A Lot Help from another person toileting, which includes using toliet, bedpan, or urinal?: Total Help from another person bathing (including washing, rinsing, drying)?: Total Help from another person to put on and taking off regular upper body clothing?: A Lot Help from another person to put on and taking off regular lower body clothing?: Total 6 Click Score: 9   End of Session Nurse Communication: Mobility status  Activity Tolerance: Patient tolerated treatment well Patient left: in bed;with call bell/phone within reach  OT Visit Diagnosis: Unsteadiness on feet (R26.81);Other abnormalities of gait and mobility (R26.89);Repeated falls (R29.6);Muscle weakness (generalized) (M62.81)                Time: 1007-1219 OT Time Calculation (min): 46 min Charges:  OT General Charges $OT Visit: 1 Visit OT Evaluation $OT Eval Moderate Complexity: 1 Mod OT Treatments $Self Care/Home Management : 8-22 mins  Gerrianne Scale, MS, OTR/L ascom [phone] 09/13/19, 5:07 PM

## 2019-09-13 NOTE — Progress Notes (Signed)
CRRT stopped at 0630 per Dr. Juleen China. 252mL blood returned to patient. Lines hep locked with 1.42mL heparin.

## 2019-09-13 NOTE — Progress Notes (Signed)
Progress Note  Patient Name: Mekel Haverstock Date of Encounter: 09/13/2019  [facility] Cardiologist: on [facility]  Subjective   Off CRRT Off BiPAP, now on nasal cannula oxygen Vascular surgery following for permacath placement in preparation for dialysis in outpatient setting Has been on nor epi for blood pressure support, Systolic pressure 893 on my rounds this morning  Inpatient Medications    Scheduled Meds: . amoxicillin-clavulanate  1 tablet Oral Q12H  . Chlorhexidine Gluconate Cloth  6 each Topical Daily  . furosemide  80 mg Intravenous Once  . hydrocortisone sod succinate (SOLU-CORTEF) inj  100 mg Intravenous Q12H  . insulin aspart  0-15 Units Subcutaneous Q4H  . ipratropium-albuterol  3 mL Nebulization TID  . levothyroxine  175 mcg Oral Daily  . mouth rinse  15 mL Mouth Rinse BID  . midodrine  10 mg Oral TID WC  . nystatin cream   Topical BID  . pantoprazole (PROTONIX) IV  40 mg Intravenous Q24H  . senna  1 tablet Oral BID  . zinc oxide  1 application Topical Daily   Continuous Infusions: .  prismasol BGK 4/2.5 Stopped (09/13/19 0630)  .  prismasol BGK 4/2.5 Stopped (09/13/19 0630)  . heparin 1,500 Units/hr (09/13/19 1000)  . norepinephrine (LEVOPHED) Adult infusion 7 mcg/min (09/13/19 1000)  . prismasol BGK 4/2.5 Stopped (09/13/19 0630)  . vasopressin Stopped (09/11/19 0700)   PRN Meds: acetaminophen **OR** acetaminophen, heparin, heparin sodium (porcine), ondansetron **OR** ondansetron (ZOFRAN) IV, polyethylene glycol, sodium chloride   Vital Signs    Vitals:   09/13/19 0600 09/13/19 0800 09/13/19 0900 09/13/19 1000  BP: (!) 113/57 (!) 116/52 130/64 (!) 142/79  Pulse: 72 74 81 81  Resp: 19 16 (!) 22 19  Temp:  98.2 F (36.8 C)    TempSrc:  Axillary    SpO2: 94% 94% 94% 95%  Weight:      Height:        Intake/Output Summary (Last 24 hours) at 09/13/2019 1314 Last data filed at 09/13/2019 1000 Gross per 24 hour  Intake 691.19 ml  Output 413 ml  Net 278.19  ml   Last 3 Weights 09/13/2019 09/12/2019 09/11/2019  Weight (lbs) 360 lb 0.2 oz 360 lb 0.2 oz 359 lb 5.6 oz  Weight (kg) 163.3 kg 163.3 kg 163 kg      Telemetry    Atrial fibrillation rate 70s- Personally Reviewed  ECG    - Personally Reviewed  Physical Exam   Constitutional: Alert, no distress, morbidly obese HENT:  Head: Grossly normal Eyes:  no discharge. No scleral icterus.  Neck: No JVD, no carotid bruits  Cardiovascular: Irregularly irregular, no murmurs appreciated Pulmonary/Chest: Clear to auscultation bilaterally, no wheezes or rails Abdominal: Soft.  no distension.  no tenderness.  Musculoskeletal: Normal range of motion, full exam not performed Neurological:  normal muscle tone. Coordination normal. No atrophy Skin: Skin warm and dry, full exam not performed Psychiatric Pleasant, conversant   Labs    High Sensitivity Troponin:   Recent Labs  Lab 09/08/19 1205 09/08/19 1607 09/09/19 0607 09/09/19 1004  TROPONINIHS 181* 445* 1,541* 2,333*      Chemistry Recent Labs  Lab 09/08/19 1205 09/08/19 1205 09/09/19 0329 09/09/19 1004 09/11/19 0433 09/12/19 0419 09/13/19 0259  NA 140   < > 139   < > 136 135 134*  K 5.3*   < > 5.3*   < > 4.2 3.7 4.3  CL 108   < > 109   < > 105  104 104  CO2 24   < > 22   < > 25 27 26   GLUCOSE 88   < > 86   < > 158* 154* 147*  BUN 94*   < > 94*   < > 51* 42* 45*  CREATININE 4.47*   < > 4.49*   < > 2.05* 1.69* 1.54*  CALCIUM 8.0*   < > 7.7*   < > 8.4* 8.9 8.4*  PROT 7.7  --  7.2  --   --   --   --   ALBUMIN 3.0*  --  2.7*  --   --   --   --   AST 25  --  48*  --   --   --   --   ALT 16  --  19  --   --   --   --   ALKPHOS 89  --  88  --   --   --   --   BILITOT 1.5*  --  1.3*  --   --   --   --   GFRNONAA 12*   < > 12*   < > 30* 38* 42*  GFRAA 14*   < > 13*   < > 35* 44* 49*  ANIONGAP 8   < > 8   < > 6 4* 4*   < > = values in this interval not displayed.     Hematology Recent Labs  Lab 09/11/19 0433 09/12/19 0711  09/13/19 0259  WBC 11.4* 11.8* 11.9*  RBC 3.19* 2.78* 2.81*  HGB 10.0* 8.8* 8.7*  HCT 31.7* 28.8* 29.5*  MCV 99.4 103.6* 105.0*  MCH 31.3 31.7 31.0  MCHC 31.5 30.6 29.5*  RDW 17.5* 17.2* 17.2*  PLT 137* 129* 140*    BNP Recent Labs  Lab 09/08/19 1205  BNP 280.3*     DDimer No results for input(s): DDIMER in the last 168 hours.   Radiology    DG Chest Port 1 View  Result Date: 09/11/2019 CLINICAL DATA:  Shortness of breath. EXAM: PORTABLE CHEST 1 VIEW COMPARISON:  Chest radiograph dated 09/09/2019 FINDINGS: The heart is enlarged. A left internal jugular central venous catheter tip overlies the superior vena cava. Moderate to severe bilateral interstitial and airspace opacities appear slightly increased since prior exam. A left pleural effusion likely contributes. There is no definite right pleural effusion. There is no pneumothorax. IMPRESSION: Moderate to severe bilateral interstitial and airspace opacities appear slightly increased since prior exam. A left pleural effusion likely contributes. Electronically Signed   By: Zerita Boers M.D.   On: 09/11/2019 14:22    Cardiac Studies   Echo 1. Left ventricular ejection fraction, by estimation, is 55 to 60%. The  left ventricle has normal function. The left ventricle has no regional  wall motion abnormalities. Left ventricular diastolic parameters are  indeterminate. Septal flattening in  systole consistent with elevated right heart pressures.  2. Right ventricular systolic function was not well visualized. The right  ventricular size is moderately enlarged. There is severely elevated  pulmonary artery systolic pressure. The estimated right ventricular  systolic pressure is 26.3 mmHg.  3. Left atrial size was mildly dilated.    Patient Profile     Nobel Brar is a 79 y.o. male with a hx of morbid obesity, obstructive sleep apnea, obesity hypoventilation syndrome, noncompliant with CPAP , diabetes , presenting with  hyperglycemia, hypoxia, hypotension, noted to be in atrial fibrillation in the emergency  room  Assessment & Plan    Atrial fibrillation Rate controlled, on heparin, Medical management at this time  Acute respiratory distress On arrival requiring BiPAP Secondary to pulmonary hypertension, obesity hypoventilation, sleep apnea, In the setting of respiratory distress/hypoxia, presenting with encephalopathy, hypercarbia Dramatic improvement on BiPAP He will require BiPAP when sleeping and when napping, needs close follow-up with pulmonary --Agree with IV Lasix today, and would continue if renal function remains stable  Pulmonary hypertension Likely secondary to chronic poorly controlled sleep apnea, morbid obesity hypoventilation, --As above BiPAP when sleeping and napping -If renal function stable, would continue Lasix given pulmonary hypertension on echocardiogram  Anemia Close monitoring, consider transfusion Followed by nephrology,   Renal failure,  stage IIIb By report baseline creatinine 1.5 Presenting with creatinine greater than 4 Numbers appear to be getting close to baseline -Agree with Lasix, he has received 80 IV today Will likely need additional Lasix if renal function continues to stabilize  Critically ill patient, multiorgan group failure including cardiac, respiratory, renal, hematologic complicated by morbid obesity, apnea  Total encounter time more than 35 minutes  Greater than 50% was spent in counseling and coordination of care with the patient   For questions or updates, please contact [facility] Please consult [email or web address] for contact info under        Signed, Ida Rogue, MD  09/13/2019, 1:14 PM

## 2019-09-13 NOTE — Evaluation (Signed)
Physical Therapy Evaluation Patient Details Name: Andrew Valentine MRN: [id number] DOB: 08/04/1940 Today's Date: 09/13/2019   History of Present Illness  Pt is a 79 year old male with a known history of morbid obesity, sleep apnea intolerant noncompliant to CPAP, hypertension, diabetes, hyperlipidemia, hypothyroidism, remote history of tobacco abuse comes to the emergency room with altered mental status and hypoxia.  Pt was on CRRT starting 09/09/19 now off CRRT with plan for PermCath placement 09/14/19.  MD assessment includes: Acute on chronic kidney failure, acute hypoxic respiratory failure, pulmonary HTN, anemia, and hypotension.    Clinical Impression  Pt was pleasant and motivated to participate during the session but ultimately was very weak functionally and required +3 total assist with all bed mobility tasks and was unable/unsafe to attempt to stand.  Pt was able to progress from requiring physical assistance to maintain static sitting at the EOB to requiring SBA/CGA for static sitting.  Pt was unable to adjust his body position while in sitting and fatigued after sitting for <10 min and required to return to supine.  Pt's SpO2 remained mostly in the low to mid 90s with several instances of dropping momentarily to the mid to high 80s.  BP remained WNL with HR increasing to the low 100s with exertion.  Pt will benefit from PT services in a SNF setting upon discharge to safely address deficits listed in patient problem list for decreased caregiver assistance and eventual return to PLOF.      Follow Up Recommendations SNF    Equipment Recommendations  Other (comment) (TBD at next venue of care)    Recommendations for Other Services       Precautions / Restrictions Precautions Precautions: Fall Restrictions Weight Bearing Restrictions: No Other Position/Activity Restrictions: Watch BP      Mobility  Bed Mobility Overal bed mobility: Needs Assistance Bed Mobility: Rolling;Sidelying to  Sit;Sit to Sidelying Rolling: +2 for physical assistance;Total assist Sidelying to sit: Total assist;+2 for physical assistance     Sit to sidelying: +2 for physical assistance;Total assist General bed mobility comments: Pt unable to adjust sitting position once sitting at the EOB; pt required assist initially to maintain static sitting balance but progressed to SBA/CGA for safety  Transfers                 General transfer comment: unable/unsafe to attempt  Ambulation/Gait             General Gait Details: Unable/unsafe to adjust  Stairs            Wheelchair Mobility    Modified Rankin (Stroke Patients Only)       Balance Overall balance assessment: Needs assistance Sitting-balance support: Single extremity supported Sitting balance-Leahy Scale: Fair Sitting balance - Comments: Poor sitting balance initially but progressed to fair during the session                                     Pertinent Vitals/Pain Pain Assessment: No/denies pain    Home Living Family/patient expects to be discharged to:: Private residence Living Arrangements: Alone Available Help at Discharge: Family;Available PRN/intermittently Type of Home: Mobile home Home Access: Ramped entrance     Home Layout: One level Home Equipment: [place] - 4 wheels;Shower seat - built in      Prior Function Level of Independence: Needs assistance   Gait / Transfers Assistance Needed: Mod Ind amb limited community distances with  a RW, >5 falls in the last year  ADL's / Homemaking Assistance Needed: Daughter lives next door and assists with ADLs        Hand Dominance        Extremity/Trunk Assessment   Upper Extremity Assessment Upper Extremity Assessment: Defer to OT evaluation    Lower Extremity Assessment Lower Extremity Assessment: RLE deficits/detail;LLE deficits/detail RLE Deficits / Details: BLE hip abd/add and hip flex <3/5, ankle strength grossly WFL,  knee ext >3/5 LLE Deficits / Details: BLE hip abd/add and hip flex <3/5, ankle strength grossly WFL, knee ext >3/5       Communication   Communication: No difficulties  Cognition Arousal/Alertness: Awake/alert Behavior During Therapy: WFL for tasks assessed/performed Overall Cognitive Status: No family/caregiver present to determine baseline cognitive functioning                                 General Comments: Pt alert to self, location, as well as to date with min cues      General Comments      Exercises Total Joint Exercises Ankle Circles/Pumps: AROM;Strengthening;Both;10 reps Quad Sets: Strengthening;Both;10 reps Gluteal Sets: Strengthening;Both;10 reps Hip ABduction/ADduction: AAROM;Both;5 reps Straight Leg Raises: AAROM;Both;5 reps Long Arc Quad: AROM;Strengthening;Both;15 reps Knee Flexion: AROM;Strengthening;Both;15 reps Other Exercises Other Exercises: HEP education for BLE APs, QS, and GS x 10 each every 1-2 hours daily   Assessment/Plan    PT Assessment Patient needs continued PT services  PT Problem List Decreased strength;Decreased activity tolerance;Decreased balance;Decreased mobility       PT Treatment Interventions DME instruction;Gait training;Functional mobility training;Therapeutic activities;Therapeutic exercise;Balance training;Patient/family education    PT Goals (Current goals can be found in the Care Plan section)  Acute Rehab PT Goals Patient Stated Goal: To walk again PT Goal Formulation: With patient Time For Goal Achievement: 09/26/19 Potential to Achieve Goals: Fair    Frequency Min 2X/week   Barriers to discharge Inaccessible home environment;Decreased caregiver support      Co-evaluation PT/OT/SLP Co-Evaluation/Treatment: Yes Reason for Co-Treatment: Complexity of the patient's impairments (multi-system involvement);For patient/therapist safety;To address functional/ADL transfers PT goals addressed during session:  Mobility/safety with mobility;Strengthening/ROM OT goals addressed during session: ADL's and self-care;Strengthening/ROM       AM-PAC PT "6 Clicks" Mobility  Outcome Measure Help needed turning from your back to your side while in a flat bed without using bedrails?: Total Help needed moving from lying on your back to sitting on the side of a flat bed without using bedrails?: Total Help needed moving to and from a bed to a chair (including a wheelchair)?: Total Help needed standing up from a chair using your arms (e.g., wheelchair or bedside chair)?: Total Help needed to walk in hospital room?: Total Help needed climbing 3-5 steps with a railing? : Total 6 Click Score: 6    End of Session Equipment Utilized During Treatment: Oxygen Activity Tolerance: Patient tolerated treatment well Patient left: in bed;with call bell/phone within reach;Other (comment) (Pt left with OT at end of session) Nurse Communication: Mobility status PT Visit Diagnosis: History of falling (Z91.81);Difficulty in walking, not elsewhere classified (R26.2);Muscle weakness (generalized) (M62.81)    Time: 6063-0160 PT Time Calculation (min) (ACUTE ONLY): 43 min   Charges:   PT Evaluation $PT Eval Moderate Complexity: 1 Mod PT Treatments $Therapeutic Exercise: 8-22 mins        D. Royetta Asal PT, DPT 09/13/19, 4:33 PM

## 2019-09-13 NOTE — Progress Notes (Signed)
[facility]  ROUNDING NOTE   Subjective:   Off CRRT. Requiring norepinephrine.   UF 391, UOP 224, net even   Objective:  Vital signs in last 24 hours:  Temp:  [97.6 F (36.4 C)-98.2 F (36.8 C)] 98.2 F (36.8 C) (09/02 0800) Pulse Rate:  [64-108] 74 (09/02 0800) Resp:  [14-19] 16 (09/02 0800) BP: (103-134)/(46-69) 116/52 (09/02 0800) SpO2:  [94 %-98 %] 94 % (09/02 0800) Weight:  [163.3 kg] 163.3 kg (09/02 0437)  Weight change: 0 kg Filed Weights   09/11/19 0451 09/12/19 0419 09/13/19 0437  Weight: (!) 163 kg (!) 163.3 kg (!) 163.3 kg    Intake/Output: I/O last 3 completed shifts: In: 1137.4 [P.O.:40; I.V.:823.2; Other:60; IV Piggyback:214.2] Out: 1443 [Urine:304; Other:815]   Intake/Output this shift:  Total I/O In: -  Out: 25 [Urine:25]  Physical Exam: General: Critically ill  Head: Normocephalic, atraumatic. Moist oral mucosal membranes  Eyes: Anicteric, PERRL  Neck: Supple, trachea midline, obese  Lungs:  Diminished bilaterally 3L [place] O2  Heart: Regular rate and rhythm  Abdomen:  Soft, nontender, obese  Extremities: ++ peripheral edema.  Neurologic: Nonfocal, moving all four extremities, answering questions intermittently.   Skin: No lesions  Access: LIJ temp HD catheter 8/29 ICU    Basic Metabolic Panel: Recent Labs  Lab 09/09/19 0329 09/09/19 1004 09/09/19 1613 09/09/19 1613 09/10/19 0409 09/10/19 0409 09/11/19 0433 09/12/19 0419 09/13/19 0259  NA 139  --  141  --  139  --  136 135 134*  K 5.3*   < > 6.0*  --  5.2*  --  4.2 3.7 4.3  CL 109  --  112*  --  110  --  105 104 104  CO2 22  --  20*  --  25  --  25 27 26   GLUCOSE 86  --  115*  --  136*  --  158* 154* 147*  BUN 94*  --  95*  --  82*  --  51* 42* 45*  CREATININE 4.49*  --  4.44*  --  3.67*  --  2.05* 1.69* 1.54*  CALCIUM 7.7*  --  8.2*   < > 7.9*   < > 8.4* 8.9 8.4*  MG 2.2  --  2.2  --  2.2  --  2.0 2.0 2.1  PHOS 7.9*  --  7.6*  --   --   --  3.8 2.9 2.3*   < > =  values in this interval not displayed.    Liver Function Tests: Recent Labs  Lab 09/08/19 1205 09/09/19 0329  AST 25 48*  ALT 16 19  ALKPHOS 89 88  BILITOT 1.5* 1.3*  PROT 7.7 7.2  ALBUMIN 3.0* 2.7*   No results for input(s): LIPASE, AMYLASE in the last 168 hours. No results for input(s): AMMONIA in the last 168 hours.  CBC: Recent Labs  Lab 09/08/19 1205 09/08/19 1205 09/09/19 0329 09/10/19 0409 09/11/19 0433 09/12/19 0711 09/13/19 0259  WBC 10.8*   < > 13.0* 11.5* 11.4* 11.8* 11.9*  NEUTROABS 8.9*  --   --   --   --   --   --   HGB 11.2*   < > 10.7* 10.3* 10.0* 8.8* 8.7*  HCT 37.7*   < > 36.3* 34.1* 31.7* 28.8* 29.5*  MCV 104.1*   < > 104.3* 104.0* 99.4 103.6* 105.0*  PLT 174   < > 186 156 137* 129* 140*   < > = values in  this interval not displayed.    Cardiac Enzymes: Recent Labs  Lab 09/09/19 0329  CKTOTAL 2,443*    BNP: Invalid input(s): POCBNP  CBG: Recent Labs  Lab 09/12/19 1614 09/12/19 1934 09/12/19 2321 09/13/19 0402 09/13/19 0738  GLUCAP 138* 163* 138* 138* 135*    Microbiology: Results for orders placed or performed during the hospital encounter of 09/08/19  MRSA PCR Screening     Status: None   Collection Time: 09/08/19 11:49 AM   Specimen: Nasopharyngeal  Result Value Ref Range Status   MRSA by PCR NEGATIVE NEGATIVE Final    Comment:        The GeneXpert MRSA Assay (FDA approved for NASAL specimens only), is one component of a comprehensive MRSA colonization surveillance program. It is not intended to diagnose MRSA infection nor to guide or monitor treatment for MRSA infections. Performed at [facility], [address] [place], [place] [address]   SARS Coronavirus 2 by RT PCR (hospital order, performed in [facility] hospital lab) Nasopharyngeal Nasopharyngeal Swab     Status: None   Collection Time: 09/08/19 12:05 PM   Specimen: Nasopharyngeal Swab  Result Value Ref Range Status   SARS Coronavirus 2 NEGATIVE  NEGATIVE Final    Comment: (NOTE) SARS-CoV-2 target nucleic acids are NOT DETECTED.  The SARS-CoV-2 RNA is generally detectable in upper and lower respiratory specimens during the acute phase of infection. The lowest concentration of SARS-CoV-2 viral copies this assay can detect is 250 copies / mL. A negative result does not preclude SARS-CoV-2 infection and should not be used as the sole basis for treatment or other patient management decisions.  A negative result may occur with improper specimen collection / handling, submission of specimen other than nasopharyngeal swab, presence of viral mutation(s) within the areas targeted by this assay, and inadequate number of viral copies (<250 copies / mL). A negative result must be combined with clinical observations, patient history, and epidemiological information.  Fact Sheet for Patients:   StrictlyIdeas.no  Fact Sheet for Healthcare Providers: BankingDealers.co.za  This test is not yet approved or  cleared by the [place] FDA and has been authorized for detection and/or diagnosis of SARS-CoV-2 by FDA under an Emergency Use Authorization (EUA).  This EUA will remain in effect (meaning this test can be used) for the duration of the COVID-19 declaration under Section 564(b)(1) of the Act, 21 U.S.C. section 360bbb-3(b)(1), unless the authorization is terminated or revoked sooner.  Performed at [facility], [address] [place], [place] [address]   Blood culture (routine x 2)     Status: Abnormal   Collection Time: 09/08/19 12:07 PM   Specimen: BLOOD  Result Value Ref Range Status   Specimen Description   Final    BLOOD L DISTAL FOREARM Performed at [facility], [address], [place], [place] [address]    Special Requests   Final    BOTTLES DRAWN AEROBIC AND ANAEROBIC Blood Culture results may not be optimal due to an excessive volume of blood received  in culture bottles Performed at [facility], [address] [place], [place] [address]    Culture  Setup Time   Final    GRAM POSITIVE COCCI AEROBIC BOTTLE ONLY CRITICAL RESULT CALLED TO, READ BACK BY AND VERIFIED WITH: MORGAN CUNNINGHAM  AT 7341 ON 09/09/19 SNG    Culture (A)  Final    STAPHYLOCOCCUS EPIDERMIDIS THE SIGNIFICANCE OF ISOLATING THIS ORGANISM FROM A SINGLE SET OF BLOOD CULTURES  WHEN MULTIPLE SETS ARE DRAWN IS UNCERTAIN. PLEASE NOTIFY THE MICROBIOLOGY DEPARTMENT WITHIN ONE WEEK IF SPECIATION AND SENSITIVITIES ARE REQUIRED. Performed at [facility], [place] [address], [place], [place] [address]    Report Status 09/11/2019 FINAL  Final  Blood Culture ID Panel (Reflexed)     Status: Abnormal   Collection Time: 09/08/19 12:07 PM  Result Value Ref Range Status   Enterococcus faecalis NOT DETECTED NOT DETECTED Final   Enterococcus Faecium NOT DETECTED NOT DETECTED Final   Listeria monocytogenes NOT DETECTED NOT DETECTED Final   Staphylococcus species DETECTED (A) NOT DETECTED Final    Comment: CRITICAL RESULT CALLED TO, READ BACK BY AND VERIFIED WITH: MORGAN CUNNINGHAM AT 1345 ON 09/09/19 SNG    Staphylococcus aureus (BCID) NOT DETECTED NOT DETECTED Final   Staphylococcus epidermidis DETECTED (A) NOT DETECTED Final    Comment: CRITICAL RESULT CALLED TO, READ BACK BY AND VERIFIED WITH: MORGAN CUNNINGHAM AT 1345 ON 09/09/19 SNG    Staphylococcus lugdunensis NOT DETECTED NOT DETECTED Final   Streptococcus species NOT DETECTED NOT DETECTED Final   Streptococcus agalactiae NOT DETECTED NOT DETECTED Final   Streptococcus pneumoniae NOT DETECTED NOT DETECTED Final   Streptococcus pyogenes NOT DETECTED NOT DETECTED Final   A.calcoaceticus-baumannii NOT DETECTED NOT DETECTED Final   Bacteroides fragilis NOT DETECTED NOT DETECTED Final   Enterobacterales NOT DETECTED NOT DETECTED Final   Enterobacter cloacae complex NOT DETECTED NOT DETECTED Final   Escherichia coli  NOT DETECTED NOT DETECTED Final   Klebsiella aerogenes NOT DETECTED NOT DETECTED Final   Klebsiella oxytoca NOT DETECTED NOT DETECTED Final   Klebsiella pneumoniae NOT DETECTED NOT DETECTED Final   Proteus species NOT DETECTED NOT DETECTED Final   Salmonella species NOT DETECTED NOT DETECTED Final   Serratia marcescens NOT DETECTED NOT DETECTED Final   Haemophilus influenzae NOT DETECTED NOT DETECTED Final   Neisseria meningitidis NOT DETECTED NOT DETECTED Final   Pseudomonas aeruginosa NOT DETECTED NOT DETECTED Final   Stenotrophomonas maltophilia NOT DETECTED NOT DETECTED Final   Candida albicans NOT DETECTED NOT DETECTED Final   Candida auris NOT DETECTED NOT DETECTED Final   Candida glabrata NOT DETECTED NOT DETECTED Final   Candida krusei NOT DETECTED NOT DETECTED Final   Candida parapsilosis NOT DETECTED NOT DETECTED Final   Candida tropicalis NOT DETECTED NOT DETECTED Final   Cryptococcus neoformans/gattii NOT DETECTED NOT DETECTED Final   Methicillin resistance mecA/C NOT DETECTED NOT DETECTED Final    Comment: Performed at [facility], [address] [place], [place] [address]  Blood culture (routine x 2)     Status: None   Collection Time: 09/08/19 12:59 PM   Specimen: BLOOD  Result Value Ref Range Status   Specimen Description BLOOD LEFT UPPER FORARM  Final   Special Requests   Final    BOTTLES DRAWN AEROBIC AND ANAEROBIC Blood Culture results may not be optimal due to an excessive volume of blood received in culture bottles   Culture   Final    NO GROWTH 5 DAYS Performed at [facility], [address] [place], [place] [address]    Report Status 09/13/2019 FINAL  Final    Coagulation Studies: No results for input(s): LABPROT, INR in the last 72 hours.  Urinalysis: No results for input(s): COLORURINE, LABSPEC, PHURINE, GLUCOSEU, HGBUR, BILIRUBINUR, KETONESUR, PROTEINUR, UROBILINOGEN, NITRITE, LEUKOCYTESUR in the last 72 hours.  Invalid  input(s): APPERANCEUR    Imaging: DG Chest Port 1 View  Result Date: 09/11/2019 CLINICAL DATA:  Shortness of breath. EXAM: PORTABLE CHEST 1 VIEW COMPARISON:  Chest radiograph dated 09/09/2019 FINDINGS: The heart is enlarged. A left internal jugular central venous catheter tip overlies the superior vena cava. Moderate to severe bilateral interstitial and airspace opacities appear slightly increased since prior exam. A left pleural effusion likely contributes. There is no definite right pleural effusion. There is no pneumothorax. IMPRESSION: Moderate to severe bilateral interstitial and airspace opacities appear slightly increased since prior exam. A left pleural effusion likely contributes. Electronically Signed   By: Zerita Boers M.D.   On: 09/11/2019 14:22     Medications:   .  prismasol BGK 4/2.5 Stopped (09/13/19 0630)  .  prismasol BGK 4/2.5 Stopped (09/13/19 0630)  . heparin 1,500 Units/hr (09/13/19 0600)  . norepinephrine (LEVOPHED) Adult infusion 7 mcg/min (09/13/19 0600)  . prismasol BGK 4/2.5 Stopped (09/13/19 0630)  . sodium phosphate  Dextrose 5% IVPB 43 mL/hr at 09/13/19 0600  . vasopressin Stopped (09/11/19 0700)   . amoxicillin-clavulanate  1 tablet Oral Q12H  . azithromycin  250 mg Oral Daily  . Chlorhexidine Gluconate Cloth  6 each Topical Daily  . hydrocortisone sod succinate (SOLU-CORTEF) inj  100 mg Intravenous Q12H  . insulin aspart  0-15 Units Subcutaneous Q4H  . ipratropium-albuterol  3 mL Nebulization TID  . levothyroxine  175 mcg Oral Daily  . mouth rinse  15 mL Mouth Rinse BID  . midodrine  10 mg Oral TID WC  . nystatin cream   Topical BID  . pantoprazole (PROTONIX) IV  40 mg Intravenous Q24H  . senna  1 tablet Oral BID  . zinc oxide  1 application Topical Daily   acetaminophen **OR** acetaminophen, heparin, heparin sodium (porcine), ondansetron **OR** ondansetron (ZOFRAN) IV, polyethylene glycol, sodium chloride  Assessment/ Plan:  Mr. Andrew Valentine is a 79  y.o. 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 Acute renal failure with hyperkalemia: on chronic kidney disease stage IIIB. Creatinine baseline reported to be 1.5 by son.  Nonoliguric urine output.  - required renal replacement therapy. CVVHDF from 8/29 to 9/2 - Monitor daily for dialysis need Plan on intermittent hemodialysis treatment.  - Recommend tunneled dialysis catheter placement if further dialysis required. However family is moving towards comfort care  2. Hypotension: requiring vasopressors - norepinephrine. With cardiogenic and septic shock from pneumonia - Appreciate cardiology input.  - empiric antibiotics: Augmentin and azithromycin  3. Acute respiratory failure with respiratory acidosis: with acute exacerbation of chronic diastolic congestive heart failure  - Appreciate pulmonary input.   Discussed case with patient and palliative care. Overall prognosis is poor. Patient expressed understanding.    LOS: 5 Trinka Keshishyan 9/2/20219:20 AM

## 2019-09-13 NOTE — Progress Notes (Signed)
CRITICAL CARE PROGRESS NOTE    Name: Andrew Valentine MRN: [id number] DOB: [birth year]     LOS: 5   SUBJECTIVE FINDINGS & SIGNIFICANT EVENTS    Patient description:  7M hx of Morbid obesity BMI >50, CHF, DM, hypothoroidism, came from home due to dehydration and AKI.  He lives alone and cannot take care of himself. He was noted to be lethargic in ED with hypercapnia and hypoxemia.  I spoke to daughter she states she came to check on him and blood glucose was 47, he was unable to get out of chair. He was weak could not walk, desaturating 70s.  He was encephalopathic at this time. Daughter lives next door.    SIGNIFICANT EVENTS: 09/10/19- patient is awake this am, he on levphed 25, hes on CRRT with dyasylate modifcation this am by renal team. S/p wound care eval, UOP has improved.   8/31- patient weaned off vasopressin, weaned levophed from 25>>10.  9/1 - patient was evaluated by [facility], he does not wish to have permanent dialysis.  Code status discussion in progress.  9/2- Plan to remove Left IJ HD catheter, trial of Lasix as per Nephrology  Lines/tubes : Urethral Catheter Maureen RN Straight-tip 16 Fr. (Active)  Indication for Insertion or Continuance of Catheter Unstable critically ill patients first 24-48 hours (See Criteria) 09/09/19 0729  Site Assessment Clean;Intact 09/09/19 0729  Catheter Maintenance Bag below level of bladder;Drainage bag/tubing not touching floor;No dependent loops;Seal intact 09/09/19 0729  Collection Container Standard drainage bag 09/09/19 0729  Securement Method Securing device (Describe) 09/09/19 0729  Urinary Catheter Interventions (if applicable) Unclamped 35/57/32 0729  Output (mL) 40 mL 09/09/19 0400    Microbiology/Sepsis markers: Results for orders placed or performed during the  hospital encounter of 09/08/19  MRSA PCR Screening     Status: None   Collection Time: 09/08/19 11:49 AM   Specimen: Nasopharyngeal  Result Value Ref Range Status   MRSA by PCR NEGATIVE NEGATIVE Final    Comment:        The GeneXpert MRSA Assay (FDA approved for NASAL specimens only), is one component of a comprehensive MRSA colonization surveillance program. It is not intended to diagnose MRSA infection nor to guide or monitor treatment for MRSA infections. Performed at [facility], [address] [place], [place] [address]   SARS Coronavirus 2 by RT PCR (hospital order, performed in [facility] hospital lab) Nasopharyngeal Nasopharyngeal Swab     Status: None   Collection Time: 09/08/19 12:05 PM   Specimen: Nasopharyngeal Swab  Result Value Ref Range Status   SARS Coronavirus 2 NEGATIVE NEGATIVE Final    Comment: (NOTE) SARS-CoV-2 target nucleic acids are NOT DETECTED.  The SARS-CoV-2 RNA is generally detectable in upper and lower respiratory specimens during the acute phase of infection. The lowest concentration of SARS-CoV-2 viral copies this assay can detect is 250 copies / mL. A negative result does not preclude SARS-CoV-2 infection and should not be used as the sole basis for treatment or other patient management decisions.  A negative result may occur with improper specimen collection / handling, submission of specimen other than nasopharyngeal swab, presence of viral mutation(s) within the areas targeted by this assay, and inadequate number of viral copies (<250 copies / mL). A negative result must be combined with clinical observations, patient history, and epidemiological information.  Fact Sheet for Patients:   StrictlyIdeas.no  Fact Sheet for Healthcare Providers: BankingDealers.co.za  This test is not yet approved or  cleared by  the [employer] and has been authorized for detection and/or  diagnosis of SARS-CoV-2 by FDA under an Emergency Use Authorization (EUA).  This EUA will remain in effect (meaning this test can be used) for the duration of the COVID-19 declaration under Section 564(b)(1) of the Act, 21 U.S.C. section 360bbb-3(b)(1), unless the authorization is terminated or revoked sooner.  Performed at [facility], [address] [place], [place] [address]   Blood culture (routine x 2)     Status: Abnormal   Collection Time: 09/08/19 12:07 PM   Specimen: BLOOD  Result Value Ref Range Status   Specimen Description   Final    BLOOD L DISTAL FOREARM Performed at [facility], [address] [place], [place] [address]    Special Requests   Final    BOTTLES DRAWN AEROBIC AND ANAEROBIC Blood Culture results may not be optimal due to an excessive volume of blood received in culture bottles Performed at [facility], [address], [place], [place] [address]    Culture  Setup Time   Final    GRAM POSITIVE COCCI AEROBIC BOTTLE ONLY CRITICAL RESULT CALLED TO, READ BACK BY AND VERIFIED WITH: MORGAN CUNNINGHAM  AT 2993 ON 09/09/19 SNG    Culture (A)  Final    STAPHYLOCOCCUS EPIDERMIDIS THE SIGNIFICANCE OF ISOLATING THIS ORGANISM FROM A SINGLE SET OF BLOOD CULTURES WHEN MULTIPLE SETS ARE DRAWN IS UNCERTAIN. PLEASE NOTIFY THE MICROBIOLOGY DEPARTMENT WITHIN ONE WEEK IF SPECIATION AND SENSITIVITIES ARE REQUIRED. Performed at [facility], [address] [place], [place] [address]    Report Status 09/11/2019 FINAL  Final  Blood Culture ID Panel (Reflexed)     Status: Abnormal   Collection Time: 09/08/19 12:07 PM  Result Value Ref Range Status   Enterococcus faecalis NOT DETECTED NOT DETECTED Final   Enterococcus Faecium NOT DETECTED NOT DETECTED Final   Listeria monocytogenes NOT DETECTED NOT DETECTED Final   Staphylococcus species DETECTED (A) NOT DETECTED Final    Comment: CRITICAL RESULT CALLED TO, READ BACK BY AND VERIFIED  WITH: MORGAN CUNNINGHAM AT 1345 ON 09/09/19 SNG    Staphylococcus aureus (BCID) NOT DETECTED NOT DETECTED Final   Staphylococcus epidermidis DETECTED (A) NOT DETECTED Final    Comment: CRITICAL RESULT CALLED TO, READ BACK BY AND VERIFIED WITH: MORGAN CUNNINGHAM AT 1345 ON 09/09/19 SNG    Staphylococcus lugdunensis NOT DETECTED NOT DETECTED Final   Streptococcus species NOT DETECTED NOT DETECTED Final   Streptococcus agalactiae NOT DETECTED NOT DETECTED Final   Streptococcus pneumoniae NOT DETECTED NOT DETECTED Final   Streptococcus pyogenes NOT DETECTED NOT DETECTED Final   A.calcoaceticus-baumannii NOT DETECTED NOT DETECTED Final   Bacteroides fragilis NOT DETECTED NOT DETECTED Final   Enterobacterales NOT DETECTED NOT DETECTED Final   Enterobacter cloacae complex NOT DETECTED NOT DETECTED Final   Escherichia coli NOT DETECTED NOT DETECTED Final   Klebsiella aerogenes NOT DETECTED NOT DETECTED Final   Klebsiella oxytoca NOT DETECTED NOT DETECTED Final   Klebsiella pneumoniae NOT DETECTED NOT DETECTED Final   Proteus species NOT DETECTED NOT DETECTED Final   Salmonella species NOT DETECTED NOT DETECTED Final   Serratia marcescens NOT DETECTED NOT DETECTED Final   Haemophilus influenzae NOT DETECTED NOT DETECTED Final   Neisseria meningitidis NOT DETECTED NOT DETECTED Final   Pseudomonas aeruginosa NOT DETECTED NOT DETECTED Final   Stenotrophomonas maltophilia NOT DETECTED NOT DETECTED Final   Candida albicans NOT DETECTED NOT DETECTED Final   Candida auris NOT DETECTED  NOT DETECTED Final   Candida glabrata NOT DETECTED NOT DETECTED Final   Candida krusei NOT DETECTED NOT DETECTED Final   Candida parapsilosis NOT DETECTED NOT DETECTED Final   Candida tropicalis NOT DETECTED NOT DETECTED Final   Cryptococcus neoformans/gattii NOT DETECTED NOT DETECTED Final   Methicillin resistance mecA/C NOT DETECTED NOT DETECTED Final    Comment: Performed at [facility], [address] [place], [place] [address]  Blood culture (routine x 2)     Status: None   Collection Time: 09/08/19 12:59 PM   Specimen: BLOOD  Result Value Ref Range Status   Specimen Description BLOOD LEFT UPPER FORARM  Final   Special Requests   Final    BOTTLES DRAWN AEROBIC AND ANAEROBIC Blood Culture results may not be optimal due to an excessive volume of blood received in culture bottles   Culture   Final    NO GROWTH 5 DAYS Performed at [facility], [address], [place], [place] [address]    Report Status 09/13/2019 FINAL  Final    Anti-infectives:  Anti-infectives (From admission, onward)   Start     Dose/Rate Route Frequency Ordered Stop   09/12/19 1100  amoxicillin-clavulanate (AUGMENTIN) 875-125 MG per tablet 1 tablet        1 tablet Oral Every 12 hours 09/12/19 1012     09/09/19 2200  fluconazole (DIFLUCAN) IVPB 400 mg  Status:  Discontinued        400 mg 100 mL/hr over 120 Minutes Intravenous Every 24 hours 09/09/19 1300 09/12/19 1012   09/09/19 1500  cefTRIAXone (ROCEPHIN) 2 g in sodium chloride 0.9 % 100 mL IVPB  Status:  Discontinued        2 g 200 mL/hr over 30 Minutes Intravenous Every 24 hours 09/09/19 1321 09/12/19 1012   09/09/19 1000  azithromycin (ZITHROMAX) tablet 250 mg       "Followed by" Linked Group Details   250 mg Oral Daily 09/08/19 1437 09/13/19 0959   09/08/19 2151  fluconazole (DIFLUCAN) IVPB 200 mg  Status:  Discontinued        200 mg 100 mL/hr over 60 Minutes Intravenous Every 24 hours 09/08/19 1850 09/09/19 1300   09/08/19 1445  cefTRIAXone (ROCEPHIN) 1 g in sodium chloride 0.9 % 100 mL IVPB  Status:  Discontinued        1 g 200 mL/hr over 30 Minutes Intravenous Every 24 hours 09/08/19 1436 09/09/19 1321   09/08/19 1445  azithromycin (ZITHROMAX) tablet 500 mg       "Followed by" Linked Group Details   500 mg Oral Daily 09/08/19 1436 09/08/19 1548       Consults: Treatment Team:  Minna Merritts, MD Liana Gerold, MD      PAST MEDICAL HISTORY   Past Medical History:  Diagnosis Date   Congestive heart failure (CHF) ([place])    Hypertension    Hypothyroidism    Morbid obesity ([place])    Obesity hypoventilation syndrome ([place])    Type 2 diabetes mellitus ([place])      SURGICAL HISTORY   Past Surgical History:  Procedure Laterality Date   none       FAMILY HISTORY   Family History  Family history unknown: Yes     SOCIAL HISTORY   Social History   Tobacco Use   Smoking status: Former Smoker   Smokeless tobacco: Never Used  Substance Use Topics   Alcohol use: Not Currently   Drug use: Never  MEDICATIONS   Current Medication:  Current Facility-Administered Medications:     prismasol BGK 4/2.5 infusion, , CRRT, Continuous, Kolluru, Sarath, MD, Stopped at 09/13/19 0630    prismasol BGK 4/2.5 infusion, , CRRT, Continuous, Kolluru, Sarath, MD, Stopped at 09/13/19 0630   acetaminophen (TYLENOL) tablet 650 mg, 650 mg, Oral, Q6H PRN **OR** acetaminophen (TYLENOL) suppository 650 mg, 650 mg, Rectal, Q6H PRN, Fritzi Mandes, MD   amoxicillin-clavulanate (AUGMENTIN) 875-125 MG per tablet 1 tablet, 1 tablet, Oral, Q12H, Ottie Glazier, MD, 1 tablet at 09/13/19 0821   [COMPLETED] azithromycin (ZITHROMAX) tablet 500 mg, 500 mg, Oral, Daily, 500 mg at 09/08/19 1548 **FOLLOWED BY** azithromycin (ZITHROMAX) tablet 250 mg, 250 mg, Oral, Daily, Fritzi Mandes, MD, 250 mg at 09/12/19 0805   Chlorhexidine Gluconate Cloth 2 % PADS 6 each, 6 each, Topical, Daily, Ottie Glazier, MD, 6 each at 09/13/19 0540   furosemide (LASIX) injection 80 mg, 80 mg, Intravenous, Once, Kolluru, Sarath, MD   heparin ADULT infusion 100 units/mL (25000 units/2103mL sodium chloride 0.45%), 1,500 Units/hr, Intravenous, Continuous, Benita Gutter, RPH, Last Rate: 15 mL/hr at 09/13/19 0600, 1,500 Units/hr at 09/13/19 0600   heparin injection 1,000-6,000 Units, 1,000-6,000 Units, CRRT, PRN, Bhutani, Manpreet S,  MD   heparin sodium (porcine) injection 1,000-6,000 Units, 1,000-6,000 Units, Intracatheter, PRN, Fritzi Mandes, MD   hydrocortisone sodium succinate (SOLU-CORTEF) 100 MG injection 100 mg, 100 mg, Intravenous, Q12H, Toya Palacios, MD, 100 mg at 09/12/19 2326   insulin aspart (novoLOG) injection 0-15 Units, 0-15 Units, Subcutaneous, Q4H, Judeth Gilles, MD, 2 Units at 09/13/19 0803   ipratropium-albuterol (DUONEB) 0.5-2.5 (3) MG/3ML nebulizer solution 3 mL, 3 mL, Nebulization, TID, Tukov-Yual, Magdalene S, NP, 3 mL at 09/13/19 0818   levothyroxine (SYNTHROID) tablet 175 mcg, 175 mcg, Oral, Daily, Fritzi Mandes, MD, 175 mcg at 09/13/19 0609   MEDLINE mouth rinse, 15 mL, Mouth Rinse, BID, Tanaya Dunigan, MD, 15 mL at 09/13/19 0804   midodrine (PROAMATINE) tablet 10 mg, 10 mg, Oral, TID WC, Swan Fairfax, MD, 10 mg at 09/13/19 0804   norepinephrine (LEVOPHED) 16 mg in 268mL premix infusion, 0-40 mcg/min, Intravenous, Titrated, Jivan Symanski, MD, Last Rate: 6.56 mL/hr at 09/13/19 0600, 7 mcg/min at 09/13/19 0600   nystatin cream (MYCOSTATIN), , Topical, BID, Ottie Glazier, MD, Given at 09/13/19 0805   ondansetron (ZOFRAN) tablet 4 mg, 4 mg, Oral, Q6H PRN **OR** ondansetron (ZOFRAN) injection 4 mg, 4 mg, Intravenous, Q6H PRN, Fritzi Mandes, MD   pantoprazole (PROTONIX) injection 40 mg, 40 mg, Intravenous, Q24H, Tukov-Yual, Magdalene S, NP, 40 mg at 09/13/19 5035   polyethylene glycol (MIRALAX / GLYCOLAX) packet 17 g, 17 g, Oral, Daily PRN, Fritzi Mandes, MD   prismasol BGK 4/2.5 infusion, , CRRT, Continuous, Kolluru, Sarath, MD, Stopped at 09/13/19 0630   senna (SENOKOT) tablet 8.6 mg, 1 tablet, Oral, BID, Fritzi Mandes, MD, 8.6 mg at 09/13/19 0804   sodium chloride 0.9 % primer fluid for CRRT, , CRRT, PRN, Bhutani, Manpreet S, MD   sodium phosphate 15 mmol in dextrose 5 % 250 mL infusion, 15 mmol, Intravenous, Once, Awilda Bill, NP, Last Rate: 43 mL/hr at 09/13/19 0600, Rate Verify at  09/13/19 0600   vasopressin (PITRESSIN) 20 Units in sodium chloride 0.9 % 100 mL infusion-*FOR SHOCK*, 0-0.03 Units/min, Intravenous, Continuous, Lanney Gins, Coty Student, MD, Paused at 09/11/19 0700   zinc oxide (BALMEX) 46.5 % cream 1 application, 1 application, Topical, Daily, Ottie Glazier, MD, 1 application at 68/12/75 0805    ALLERGIES   Penicillins  REVIEW OF SYSTEMS    Unable to obtain due to lethargy  PHYSICAL EXAMINATION   Vital Signs: Temp:  [97.6 F (36.4 C)-98.2 F (36.8 C)] 98.2 F (36.8 C) (09/02 0800) Pulse Rate:  [64-108] 74 (09/02 0800) Resp:  [14-19] 16 (09/02 0800) BP: (103-134)/(46-69) 116/52 (09/02 0800) SpO2:  [94 %-98 %] 94 % (09/02 0800) Weight:  [163.3 kg] 163.3 kg (09/02 0437)  GENERAL: Acutely ill appearing male, sitting on bed, on 3L [place] HEAD: Atraumatic, normocephalic, neck supple, no JVD   EYES:Pupils PERRLA, No scleral icterus  MOUTH: Moist mucus membranes NECK: Supple, no JVD  PULMONARY: Decreased breath sounds bilaterally, even, nonlabored CARDIOVASCULAR: Regular rate & rhythm, s1s2, no M/R/G GASTROINTESTINAL: excoriated rash with skin breakdown of anterior lowe abdominal pannus. Obese, soft, nontender, nondistended, no guarding or rebound tenderness MUSCULOSKELETAL: No clubbing or swelling  NEUROLOGIC:  Awake, alert, oriented to place and time, follows commands, no focal deficits, speech clear SKIN: Warm and dry.  No obvious rashes, lesions, or ulcerations   PERTINENT DATA     Infusions:   prismasol BGK 4/2.5 Stopped (09/13/19 0630)    prismasol BGK 4/2.5 Stopped (09/13/19 0630)   heparin 1,500 Units/hr (09/13/19 0600)   norepinephrine (LEVOPHED) Adult infusion 7 mcg/min (09/13/19 0600)   prismasol BGK 4/2.5 Stopped (09/13/19 0630)   sodium phosphate  Dextrose 5% IVPB 43 mL/hr at 09/13/19 0600   vasopressin Stopped (09/11/19 0700)   Scheduled Medications:  amoxicillin-clavulanate  1 tablet Oral Q12H   azithromycin  250  mg Oral Daily   Chlorhexidine Gluconate Cloth  6 each Topical Daily   furosemide  80 mg Intravenous Once   hydrocortisone sod succinate (SOLU-CORTEF) inj  100 mg Intravenous Q12H   insulin aspart  0-15 Units Subcutaneous Q4H   ipratropium-albuterol  3 mL Nebulization TID   levothyroxine  175 mcg Oral Daily   mouth rinse  15 mL Mouth Rinse BID   midodrine  10 mg Oral TID WC   nystatin cream   Topical BID   pantoprazole (PROTONIX) IV  40 mg Intravenous Q24H   senna  1 tablet Oral BID   zinc oxide  1 application Topical Daily   PRN Medications: acetaminophen **OR** acetaminophen, heparin, heparin sodium (porcine), ondansetron **OR** ondansetron (ZOFRAN) IV, polyethylene glycol, sodium chloride Hemodynamic parameters:   Intake/Output: 09/01 0701 - 09/02 0700 In: 622.2 [P.O.:40; I.V.:513; IV Piggyback:14.1] Out: 598 [Urine:217]  Ventilator  Settings:     LAB RESULTS:  Basic Metabolic Panel: Recent Labs  Lab 09/09/19 0329 09/09/19 1004 09/09/19 1613 09/09/19 1613 09/10/19 0409 09/10/19 0409 09/11/19 0433 09/11/19 0433 09/12/19 0419 09/13/19 0259  NA 139  --  141  --  139  --  136  --  135 134*  K 5.3*   < > 6.0*   < > 5.2*   < > 4.2   < > 3.7 4.3  CL 109  --  112*  --  110  --  105  --  104 104  CO2 22  --  20*  --  25  --  25  --  27 26  GLUCOSE 86  --  115*  --  136*  --  158*  --  154* 147*  BUN 94*  --  95*  --  82*  --  51*  --  42* 45*  CREATININE 4.49*  --  4.44*  --  3.67*  --  2.05*  --  1.69* 1.54*  CALCIUM 7.7*  --  8.2*  --  7.9*  --  8.4*  --  8.9 8.4*  MG 2.2  --  2.2  --  2.2  --  2.0  --  2.0 2.1  PHOS 7.9*  --  7.6*  --   --   --  3.8  --  2.9 2.3*   < > = values in this interval not displayed.   Liver Function Tests: Recent Labs  Lab 09/08/19 1205 09/09/19 0329  AST 25 48*  ALT 16 19  ALKPHOS 89 88  BILITOT 1.5* 1.3*  PROT 7.7 7.2  ALBUMIN 3.0* 2.7*   No results for input(s): LIPASE, AMYLASE in the last 168 hours. No results  for input(s): AMMONIA in the last 168 hours. CBC: Recent Labs  Lab 09/08/19 1205 09/08/19 1205 09/09/19 0329 09/10/19 0409 09/11/19 0433 09/12/19 0711 09/13/19 0259  WBC 10.8*   < > 13.0* 11.5* 11.4* 11.8* 11.9*  NEUTROABS 8.9*  --   --   --   --   --   --   HGB 11.2*   < > 10.7* 10.3* 10.0* 8.8* 8.7*  HCT 37.7*   < > 36.3* 34.1* 31.7* 28.8* 29.5*  MCV 104.1*   < > 104.3* 104.0* 99.4 103.6* 105.0*  PLT 174   < > 186 156 137* 129* 140*   < > = values in this interval not displayed.   Cardiac Enzymes: Recent Labs  Lab 09/09/19 0329  CKTOTAL 2,443*   BNP: Invalid input(s): POCBNP CBG: Recent Labs  Lab 09/12/19 1614 09/12/19 1934 09/12/19 2321 09/13/19 0402 09/13/19 0738  GLUCAP 138* 163* 138* 138* 135*       IMAGING RESULTS:  Imaging: DG Chest Port 1 View  Result Date: 09/11/2019 CLINICAL DATA:  Shortness of breath. EXAM: PORTABLE CHEST 1 VIEW COMPARISON:  Chest radiograph dated 09/09/2019 FINDINGS: The heart is enlarged. A left internal jugular central venous catheter tip overlies the superior vena cava. Moderate to severe bilateral interstitial and airspace opacities appear slightly increased since prior exam. A left pleural effusion likely contributes. There is no definite right pleural effusion. There is no pneumothorax. IMPRESSION: Moderate to severe bilateral interstitial and airspace opacities appear slightly increased since prior exam. A left pleural effusion likely contributes. Electronically Signed   By: Zerita Boers M.D.   On: 09/11/2019 14:22   @PROBHOSP @ No results found.     ASSESSMENT AND PLAN     Acute Hypoxic Respiratory Failure -negative COVID -8/29 -due to most likely OHS/chronic thoracic restriction with concomitant OSA -background of CHF- repeat TTE  -CXR reviewd independently by me- cardiomegaly, mild interstitial infiltrates with platelike atelectasis bilaterally. Repeat CXR 8/31-with more appreciable right sided infiltrate suggestive  of asp pna vs cap-zitrhromax and rocephin -empirically treating for CAP-will deescalate as appropriate -currently on BIPAP- ABG with mixed acid base disorder , have increased driving pressure and plan for repeat ABG today -reviewed care plan with Daughter today- -patient was more interactive today   Acute Renal failure- stage 5 - oliguric -Monitor I&O's / urinary output -Follow BMP -Ensure adequate renal perfusion -Avoid nephrotoxic agents as able -Replace electrolytes as indicated -Nephrology following, appreicate input -CRRT stopped 9/2; plan for trial of Lasix -Temporary HD catheter to be removed today -After discussion with family and Palliative Care, pt wishes to proceed with HD ~ plan for PermCath placement tomorrow    Demand ischemia -Continuous cardiac monitoring -Maintain MAP >65 -Trend troponin -Cardiology following, appreciate input -s/p TTE with significantly elevated right heart pressures possibly due to pulmonary  htn vs VTE.   -Continue Heparin gtt -[place] LE and d-dimer     Encephalopathy >>improved    -toxic metabolic vs hypercapnic.  -Continue BiPaP  Excoriated rash of abdominal pannus -dressing per RN -wound care consultation  -empiric abx    ID -continue IV abx as prescibed -follow up cultures  GI/Nutrition GI PROPHYLAXIS as indicated DIET-->TF's as tolerated Constipation protocol as indicated  ENDO - ICU hypoglycemic\Hyperglycemia protocol -check FSBS per protocol   ELECTROLYTES -follow labs as needed -replace as needed -pharmacy consultation   DVT/GI PRX ordered -SCDs  TRANSFUSIONS AS NEEDED MONITOR FSBS ASSESS the need for LABS as needed     Critical care provider statement:    Critical care time (minutes):  33   Critical care time was exclusive of:  Separately billable procedures and  treating other patients   Critical care was necessary to treat or prevent imminent or  life-threatening deterioration of the following  conditions:  hypotension , AKI, OHS, morbid obesity , multiple comorbid conditions   Critical care was time spent personally by me on the following  activities:  Development of treatment plan with patient or surrogate,  discussions with consultants, evaluation of patient's response to  treatment, examination of patient, obtaining history from patient or  surrogate, ordering and performing treatments and interventions, ordering  and review of laboratory studies and re-evaluation of patient's condition   I assumed direction of critical care for this patient from another  provider in my specialty: no                                                                                                     Ottie Glazier, M.D.  Pulmonary & [place]

## 2019-09-13 NOTE — Consult Note (Signed)
[facility] Vascular Consult Note  MRN : [id number]  Andrew Valentine is a 79 y.o. ([birth year]) male who presents with chief complaint of  Chief Complaint  Patient presents with  . Hypotension  . Hypoglycemia  . Altered Mental Status   History of Present Illness:  Andrew Valentine a20 year oldmalewith a known history of morbid obesity, sleep apnea intolerant noncompliant to CPAP, hypertension, diabetes, hyperlipidemia, hypothyroidism, remote history of tobacco abuse comes to the emergency room with altered mental status and hypoxia.  Patient was on CRRT via a left internal jugular temporary dialysis catheter placed on September 09, 2019 by the ICU staff for acute renal failure with hyperkalemia on chronic kidney disease stage III.  Patient without significant complaint this AM.  Discussed PermCath placement.  Answered the patient's questions.  Vascular surgery was consulted by Dr. Juleen China to place PermCath allow the patient to continue to dialyze in the inpatient outpatient setting.  Current Facility-Administered Medications  Medication Dose Route Frequency Provider Last Rate Last Admin  .  prismasol BGK 4/2.5 infusion   CRRT Continuous Lavonia Dana, MD   Stopped at 09/13/19 0630  .  prismasol BGK 4/2.5 infusion   CRRT Continuous Lavonia Dana, MD   Stopped at 09/13/19 0630  . acetaminophen (TYLENOL) tablet 650 mg  650 mg Oral Q6H PRN Fritzi Mandes, MD       Or  . acetaminophen (TYLENOL) suppository 650 mg  650 mg Rectal Q6H PRN Fritzi Mandes, MD      . amoxicillin-clavulanate (AUGMENTIN) 875-125 MG per tablet 1 tablet  1 tablet Oral Q12H Ottie Glazier, MD   1 tablet at 09/13/19 0821  . Chlorhexidine Gluconate Cloth 2 % PADS 6 each  6 each Topical Daily Ottie Glazier, MD   6 each at 09/13/19 0540  . furosemide (LASIX) injection 80 mg  80 mg Intravenous Once Kolluru, Sarath, MD      . heparin ADULT infusion 100 units/mL (25000 units/247mL sodium chloride 0.45%)  1,500  Units/hr Intravenous Continuous Benita Gutter, RPH 15 mL/hr at 09/13/19 0600 1,500 Units/hr at 09/13/19 0600  . heparin injection 1,000-6,000 Units  1,000-6,000 Units CRRT PRN Bhutani, Manpreet S, MD      . heparin sodium (porcine) injection 1,000-6,000 Units  1,000-6,000 Units Intracatheter PRN Fritzi Mandes, MD      . hydrocortisone sodium succinate (SOLU-CORTEF) 100 MG injection 100 mg  100 mg Intravenous Q12H Ottie Glazier, MD   100 mg at 09/12/19 2326  . insulin aspart (novoLOG) injection 0-15 Units  0-15 Units Subcutaneous Q4H Ottie Glazier, MD   2 Units at 09/13/19 0803  . ipratropium-albuterol (DUONEB) 0.5-2.5 (3) MG/3ML nebulizer solution 3 mL  3 mL Nebulization TID Tukov-Yual, Magdalene S, NP   3 mL at 09/13/19 0818  . levothyroxine (SYNTHROID) tablet 175 mcg  175 mcg Oral Daily Fritzi Mandes, MD   175 mcg at 09/13/19 [phone]  . MEDLINE mouth rinse  15 mL Mouth Rinse BID Ottie Glazier, MD   15 mL at 09/13/19 0804  . midodrine (PROAMATINE) tablet 10 mg  10 mg Oral TID WC Ottie Glazier, MD   10 mg at 09/13/19 0804  . norepinephrine (LEVOPHED) 16 mg in 215mL premix infusion  0-40 mcg/min Intravenous Titrated Ottie Glazier, MD 6.56 mL/hr at 09/13/19 0600 7 mcg/min at 09/13/19 0600  . nystatin cream (MYCOSTATIN)   Topical BID Ottie Glazier, MD   Given at 09/13/19 0805  . ondansetron (ZOFRAN) tablet 4 mg  4 mg Oral Q6H PRN Posey Pronto,  Sona, MD       Or  . ondansetron (ZOFRAN) injection 4 mg  4 mg Intravenous Q6H PRN Fritzi Mandes, MD      . pantoprazole (PROTONIX) injection 40 mg  40 mg Intravenous Q24H Tukov-Yual, Magdalene S, NP   40 mg at 09/13/19 0608  . polyethylene glycol (MIRALAX / GLYCOLAX) packet 17 g  17 g Oral Daily PRN Fritzi Mandes, MD      . prismasol BGK 4/2.5 infusion   CRRT Continuous Lavonia Dana, MD   Stopped at 09/13/19 0630  . senna (SENOKOT) tablet 8.6 mg  1 tablet Oral BID Fritzi Mandes, MD   8.6 mg at 09/13/19 0804  . sodium chloride 0.9 % primer fluid for CRRT   CRRT  PRN Bhutani, Manpreet S, MD      . sodium phosphate 15 mmol in dextrose 5 % 250 mL infusion  15 mmol Intravenous Once Awilda Bill, NP 43 mL/hr at 09/13/19 0600 Rate Verify at 09/13/19 0600  . vasopressin (PITRESSIN) 20 Units in sodium chloride 0.9 % 100 mL infusion-*FOR SHOCK*  0-0.03 Units/min Intravenous Continuous Ottie Glazier, MD   Paused at 09/11/19 0700  . zinc oxide (BALMEX) 16.1 % cream 1 application  1 application Topical Daily Ottie Glazier, MD   1 application at 09/60/45 0805   Past Medical History:  Diagnosis Date  . Congestive heart failure (CHF) ([employer])   . Hypertension   . Hypothyroidism   . Morbid obesity ([place])   . Obesity hypoventilation syndrome ([place])   . Type 2 diabetes mellitus ([place])    Past Surgical History:  Procedure Laterality Date  . none     Social History Social History   Tobacco Use  . Smoking status: Former Research scientist (life sciences)  . Smokeless tobacco: Never Used  Substance Use Topics  . Alcohol use: Not Currently  . Drug use: Never   Family History Family History  Family history unknown: Yes  Denies family history of peripheral artery disease, venous disease or renal disease.  Allergies  Allergen Reactions  . Penicillins Rash   REVIEW OF SYSTEMS (Negative unless checked)  Constitutional: [] Weight loss  [] Fever  [] Chills Cardiac: [] Chest pain   [] Chest pressure   [] Palpitations   [x] Shortness of breath when laying flat   [x] Shortness of breath at rest   [x] Shortness of breath with exertion. Vascular:  [] Pain in legs with walking   [] Pain in legs at rest   [] Pain in legs when laying flat   [] Claudication   [] Pain in feet when walking  [] Pain in feet at rest  [] Pain in feet when laying flat   [] History of DVT   [] Phlebitis   [x] Swelling in legs   [] Varicose veins   [] Non-healing ulcers Pulmonary:   [] Uses home oxygen   [] Productive cough   [] Hemoptysis   [] Wheeze  [] COPD   [] Asthma Neurologic:  [] Dizziness  [] Blackouts   [] Seizures   [] History of stroke    [] History of TIA  [] Aphasia   [] Temporary blindness   [] Dysphagia   [] Weakness or numbness in arms   [] Weakness or numbness in legs Musculoskeletal:  [] Arthritis   [] Joint swelling   [] Joint pain   [] Low back pain Hematologic:  [] Easy bruising  [] Easy bleeding   [] Hypercoagulable state   [] Anemic  [] Hepatitis Gastrointestinal:  [] Blood in stool   [] Vomiting blood  [] Gastroesophageal reflux/heartburn   [] Difficulty swallowing. Genitourinary:  [x] Chronic kidney disease   [] Difficult urination  [] Frequent urination  [] Burning with urination   [] Blood in urine  Skin:  [] Rashes   [] Ulcers   [] Wounds Psychological:  [] History of anxiety   []  History of major depression.  Physical Examination  Vitals:   09/13/19 0437 09/13/19 0500 09/13/19 0600 09/13/19 0800  BP:  (!) 109/56 (!) 113/57 (!) 116/52  Pulse:  72 72 74  Resp:  17 19 16   Temp:    98.2 F (36.8 C)  TempSrc:    Axillary  SpO2:  96% 94% 94%  Weight: (!) 163.3 kg     Height:       Body mass index is 54.74 kg/m. Gen:  WD/WN, NAD Head: [place]/AT, No temporalis wasting. Prominent temp pulse not noted. Ear/Nose/Throat: Hearing grossly intact, nares w/o erythema or drainage, oropharynx w/o Erythema/Exudate Eyes: Sclera non-icteric, conjunctiva clear Neck: Trachea midline.  No JVD.  Pulmonary:  Good air movement, respirations not labored, equal bilaterally.  Cardiac: RRR, normal S1, S2. Vascular:  Vessel Right Left  Radial Palpable Palpable  Ulnar Palpable Palpable                               Gastrointestinal: soft, non-tender/non-distended. No guarding/reflex.  Musculoskeletal: M/S 5/5 throughout.  Extremities without ischemic changes.  No deformity or atrophy. No edema. Neurologic: Sensation grossly intact in extremities.  Symmetrical.  Speech is fluent. Motor exam as listed above. Psychiatric: Judgment intact, Mood & affect appropriate for pt's clinical situation. Dermatologic: No rashes or ulcers noted.  No cellulitis or  open wounds. Lymph : No Cervical, Axillary, or Inguinal lymphadenopathy.  CBC Lab Results  Component Value Date   WBC 11.9 (H) 09/13/2019   HGB 8.7 (L) 09/13/2019   HCT 29.5 (L) 09/13/2019   MCV 105.0 (H) 09/13/2019   PLT 140 (L) 09/13/2019   BMET    Component Value Date/Time   NA 134 (L) 09/13/2019 0259   K 4.3 09/13/2019 0259   CL 104 09/13/2019 0259   CO2 26 09/13/2019 0259   GLUCOSE 147 (H) 09/13/2019 0259   BUN 45 (H) 09/13/2019 0259   CREATININE 1.54 (H) 09/13/2019 0259   CALCIUM 8.4 (L) 09/13/2019 0259   GFRNONAA 42 (L) 09/13/2019 0259   GFRAA 49 (L) 09/13/2019 0259   Estimated Creatinine Clearance: 58.5 mL/min (A) (by C-G formula based on SCr of 1.54 mg/dL (H)).  COAG Lab Results  Component Value Date   INR 1.2 09/08/2019   Radiology US RENAL  Result Date: 09/08/2019 CLINICAL DATA:  ATN EXAM: RENAL / URINARY TRACT ULTRASOUND COMPLETE COMPARISON:  None. FINDINGS: Right Kidney: Renal measurements: 10.2 x 4.8 x 5.2 cm = volume: 132 mL. Echogenicity within normal limits. No hydronephrosis. There is a cystic mass in the mid pole the right kidney measuring 1.2 cm. Left Kidney: Renal measurements: 9.1 x 4.8 x 4.3 cm = volume: 97 mL. Echogenicity within normal limits. No mass or hydronephrosis visualized. Bladder: Not visualized. Other: Exam limited by patient body habitus and inability to change position. IMPRESSION: 1. Limited exam. No definite acute finding in the bilateral kidneys. 2.  Small cyst in the midpole of the right kidney. Electronically Signed   By: Audie Pinto M.D.   On: 09/08/2019 17:06   US Venous Img Lower Bilateral (DVT)  Result Date: 09/09/2019 CLINICAL DATA:  Swelling in the bilateral legs, rule out DVT. EXAM: BILATERAL LOWER EXTREMITY VENOUS DOPPLER ULTRASOUND TECHNIQUE: Gray-scale sonography with compression, as well as color and duplex ultrasound, were performed to evaluate the deep venous system(s) from the level  of the common femoral vein  through the popliteal and proximal calf veins. COMPARISON:  None. FINDINGS: VENOUS Normal compressibility of the common femoral, superficial femoral, and popliteal veins, as well as the visualized calf veins. Visualized portions of profunda femoral vein and great saphenous vein unremarkable. No filling defects to suggest DVT on grayscale or color Doppler imaging. Doppler waveforms show normal direction of venous flow, normal respiratory plasticity and response to augmentation. Limited views of the contralateral common femoral vein are unremarkable. OTHER None. Limitations: none IMPRESSION: Negative for DVT in the bilateral lower extremities. Electronically Signed   By: Audie Pinto M.D.   On: 09/09/2019 14:38   DG Chest Port 1 View  Result Date: 09/11/2019 CLINICAL DATA:  Shortness of breath. EXAM: PORTABLE CHEST 1 VIEW COMPARISON:  Chest radiograph dated 09/09/2019 FINDINGS: The heart is enlarged. A left internal jugular central venous catheter tip overlies the superior vena cava. Moderate to severe bilateral interstitial and airspace opacities appear slightly increased since prior exam. A left pleural effusion likely contributes. There is no definite right pleural effusion. There is no pneumothorax. IMPRESSION: Moderate to severe bilateral interstitial and airspace opacities appear slightly increased since prior exam. A left pleural effusion likely contributes. Electronically Signed   By: Zerita Boers M.D.   On: 09/11/2019 14:22   DG Chest Port 1 View  Result Date: 09/09/2019 CLINICAL DATA:  Central line placement, history of CHF EXAM: PORTABLE CHEST 1 VIEW COMPARISON:  09/08/2019 FINDINGS: Left IJ double-lumen central line tip overlies the SVC. No pneumothorax. There is similar lung aeration with patchy bilateral opacities. Probable small bilateral pleural effusions. Top-normal heart size. Possible enlargement of the main pulmonary artery. IMPRESSION: Interval left IJ line placement with tip  overlying SVC. No pneumothorax. Lung aeration is not substantially changed. Electronically Signed   By: Macy Mis M.D.   On: 09/09/2019 08:07   DG Chest Portable 1 View  Result Date: 09/08/2019 CLINICAL DATA:  Oxygen desaturation, hypoglycemia, shortness of breath, history COPD, CHF EXAM: PORTABLE CHEST 1 VIEW COMPARISON:  Portable exam 1224 hours without priors for comparison FINDINGS: Upper normal heart size. Mediastinal contours normal. Patchy infiltrates bilaterally greater on RIGHT, question pneumonia, asymmetric edema considered less likely. Probable small LEFT pleural effusion. No pneumothorax. IMPRESSION: BILATERAL pulmonary infiltrates favoring pneumonia. Probable small LEFT pleural effusion. Electronically Signed   By: Lavonia Dana M.D.   On: 09/08/2019 13:06   ECHOCARDIOGRAM COMPLETE  Result Date: 09/09/2019    ECHOCARDIOGRAM REPORT   Patient Name:   Andrew Valentine   Date of Exam: 09/09/2019 Medical Rec #:  [id number]  Height:       68.0 in Accession #:    [id number] Weight:       330.0 lb Date of Birth:  June 17, 1940  BSA:          2.528 m Patient Age:    59 years   BP:           115/48 mmHg Patient Gender: M          HR:           72 bpm. Exam Location:  [facility] Procedure: 2D Echo, Cardiac Doppler and Color Doppler Indications:     Atrial Fibrillation 427.31  History:         Patient has no prior history of Echocardiogram examinations.                  CHF; Risk Factors:Hypertension and Diabetes.  Sonographer:     Sonia Side  Hege RDCS (AE) Referring Phys:  [place]eria Diagnosing Phys: Ida Rogue MD  Sonographer Comments: No apical window, no subcostal window and Technically difficult study due to poor echo windows. IMPRESSIONS  1. Left ventricular ejection fraction, by estimation, is 55 to 60%. The left ventricle has normal function. The left ventricle has no regional wall motion abnormalities. Left ventricular diastolic parameters are indeterminate. Septal flattening in systole consistent with  elevated right heart pressures.  2. Right ventricular systolic function was not well visualized. The right ventricular size is moderately enlarged. There is severely elevated pulmonary artery systolic pressure. The estimated right ventricular systolic pressure is 10.2 mmHg.  3. Left atrial size was mildly dilated. FINDINGS  Left Ventricle: Left ventricular ejection fraction, by estimation, is 55 to 60%. The left ventricle has normal function. The left ventricle has no regional wall motion abnormalities. The left ventricular internal cavity size was normal in size. There is  no left ventricular hypertrophy. Left ventricular diastolic parameters are indeterminate. Right Ventricle: The right ventricular size is moderately enlarged. No increase in right ventricular wall thickness. Right ventricular systolic function was not well visualized. There is severely elevated pulmonary artery systolic pressure. The tricuspid  regurgitant velocity is 3.94 m/s, and with an assumed right atrial pressure of 10 mmHg, the estimated right ventricular systolic pressure is 72.5 mmHg. Left Atrium: Left atrial size was mildly dilated. Right Atrium: Right atrial size was normal in size. Pericardium: There is no evidence of pericardial effusion. Mitral Valve: The mitral valve was not well visualized. Normal mobility of the mitral valve leaflets. No evidence of mitral valve regurgitation. No evidence of mitral valve stenosis. Tricuspid Valve: The tricuspid valve is not well visualized. Tricuspid valve regurgitation is not demonstrated. No evidence of tricuspid stenosis. Aortic Valve: The aortic valve was not well visualized. Aortic valve regurgitation is not visualized. Mild aortic valve sclerosis is present, with no evidence of aortic valve stenosis. Pulmonic Valve: The pulmonic valve was not well visualized. Pulmonic valve regurgitation is not visualized. No evidence of pulmonic stenosis. Aorta: The aortic root is normal in size and  structure and the aortic root was not well visualized. Venous: The inferior vena cava is normal in size with greater than 50% respiratory variability, suggesting right atrial pressure of 3 mmHg. IAS/Shunts: No atrial level shunt detected by color flow Doppler.  LEFT VENTRICLE PLAX 2D LVIDd:         5.24 cm LVIDs:         2.96 cm LV PW:         1.11 cm LV IVS:        0.96 cm LVOT diam:     2.00 cm LVOT Area:     3.14 cm  LEFT ATRIUM         Index LA diam:    4.50 cm 1.78 cm/m                        PULMONIC VALVE AORTA                 PV Vmax:        0.64 m/s Ao Root diam: 3.20 cm PV Peak grad:   1.6 mmHg                       RVOT Peak grad: 2 mmHg  TRICUSPID VALVE TR Peak grad:   62.1 mmHg TR Vmax:  394.00 cm/s  SHUNTS Systemic Diam: 2.00 cm Ida Rogue MD Electronically signed by Ida Rogue MD Signature Date/Time: 09/09/2019/3:00:55 PM    Final    Assessment/Plan Andrew Valentine a85 year oldmalewith a known history of morbid obesity, sleep apnea intolerant noncompliant to CPAP, hypertension, diabetes, hyperlipidemia, hypothyroidism, remote history of tobacco abuse comes to the emergency room with altered mental status and hypoxia.  1.  Acute on chronic kidney failure: Patient was on CRRT via a left internal jugular temporary dialysis catheter placed on September 09, 2019 by the ICU staff for acute renal failure with hyperkalemia on chronic kidney disease stage III.  Nephrology has consulted our service to place a PermCath to allow the patient to continue to dialyze in the inpatient outpatient setting.  Discussed the procedure, risks and benefits with the patient in detail this AM.  All questions were answered.  The patient wishes to proceed.  We will plan on PermCath insertion tomorrow with Dr. Delana Meyer  2.  Acute hypoxic respiratory failure: We will monitor O2 saturations during procedure Will most likely keep patient on CPAP during procedure  3.  Hypotension: Continues to require  vasopressors (norepinephrine) District monitoring of vitals during procedure Will try to use minimal sedation  Discussed with Dr. Francene Castle, PA-C  09/13/2019 10:36 AM  This note was created with Dragon medical transcription system.  Any error is purely unintentional

## 2019-09-14 ENCOUNTER — Encounter: Payer: Self-pay | Admitting: Vascular Surgery

## 2019-09-14 ENCOUNTER — Inpatient Hospital Stay: Payer: Medicare Other

## 2019-09-14 ENCOUNTER — Encounter
Admission: EM | Disposition: A | Discharge status: Hospice | Payer: Self-pay | Source: Home / Self Care | Attending: Internal Medicine

## 2019-09-14 DIAGNOSIS — N185 Chronic kidney disease, stage 5: Secondary | ICD-10-CM

## 2019-09-14 DIAGNOSIS — E662 Morbid (severe) obesity with alveolar hypoventilation: Secondary | ICD-10-CM

## 2019-09-14 DIAGNOSIS — R0902 Hypoxemia: Secondary | ICD-10-CM

## 2019-09-14 HISTORY — PX: DIALYSIS/PERMA CATHETER INSERTION: CATH118288

## 2019-09-14 LAB — GLUCOSE, CAPILLARY
Glucose-Capillary: 110 mg/dL — ABNORMAL HIGH (ref 70–99)
Glucose-Capillary: 130 mg/dL — ABNORMAL HIGH (ref 70–99)
Glucose-Capillary: 130 mg/dL — ABNORMAL HIGH (ref 70–99)
Glucose-Capillary: 145 mg/dL — ABNORMAL HIGH (ref 70–99)
Glucose-Capillary: 147 mg/dL — ABNORMAL HIGH (ref 70–99)
Glucose-Capillary: 148 mg/dL — ABNORMAL HIGH (ref 70–99)

## 2019-09-14 LAB — RENAL FUNCTION PANEL
Albumin: 2.3 g/dL — ABNORMAL LOW (ref 3.5–5.0)
Anion gap: 6 (ref 5–15)
BUN: 74 mg/dL — ABNORMAL HIGH (ref 8–23)
CO2: 27 mmol/L (ref 22–32)
Calcium: 8.3 mg/dL — ABNORMAL LOW (ref 8.9–10.3)
Chloride: 102 mmol/L (ref 98–111)
Creatinine, Ser: 2.23 mg/dL — ABNORMAL HIGH (ref 0.61–1.24)
GFR calc Af Amer: 31 mL/min — ABNORMAL LOW (ref >60)
GFR calc non Af Amer: 27 mL/min — ABNORMAL LOW (ref >60)
Glucose, Bld: 149 mg/dL — ABNORMAL HIGH (ref 70–99)
Phosphorus: 2.4 mg/dL — ABNORMAL LOW (ref 2.5–4.6)
Potassium: 3.9 mmol/L (ref 3.5–5.1)
Sodium: 135 mmol/L (ref 135–145)

## 2019-09-14 LAB — CBC
HCT: 22.7 % — ABNORMAL LOW (ref 39.0–52.0)
Hemoglobin: 7.1 g/dL — ABNORMAL LOW (ref 13.0–17.0)
MCH: 31.1 pg (ref 26.0–34.0)
MCHC: 31.3 g/dL (ref 30.0–36.0)
MCV: 99.6 fL (ref 80.0–100.0)
Platelets: 122 10*3/uL — ABNORMAL LOW (ref 150–400)
RBC: 2.28 MIL/uL — ABNORMAL LOW (ref 4.22–5.81)
RDW: 17.2 % — ABNORMAL HIGH (ref 11.5–15.5)
WBC: 11 10*3/uL — ABNORMAL HIGH (ref 4.0–10.5)
nRBC: 1 % — ABNORMAL HIGH (ref 0.0–0.2)

## 2019-09-14 LAB — PHOSPHORUS: Phosphorus: 2.5 mg/dL (ref 2.5–4.6)

## 2019-09-14 LAB — ABO/RH: ABO/RH(D): O POS

## 2019-09-14 LAB — IRON AND TIBC
Iron: 69 ug/dL (ref 45–182)
Saturation Ratios: 28 % (ref 17.9–39.5)
TIBC: 246 ug/dL — ABNORMAL LOW (ref 250–450)
UIBC: 177 ug/dL

## 2019-09-14 LAB — MAGNESIUM: Magnesium: 1.9 mg/dL (ref 1.7–2.4)

## 2019-09-14 LAB — FERRITIN: Ferritin: 55 ng/mL (ref 24–336)

## 2019-09-14 LAB — PREPARE RBC (CROSSMATCH)

## 2019-09-14 SURGERY — DIALYSIS/PERMA CATHETER INSERTION
Anesthesia: Moderate Sedation

## 2019-09-14 MED ORDER — SODIUM CHLORIDE 0.9 % IV SOLN: INTRAVENOUS | Status: DC

## 2019-09-14 MED ORDER — HEPARIN (PORCINE) 25000 UT/250ML-% IV SOLN: 1600.0000 [IU]/h | INTRAVENOUS | Status: DC

## 2019-09-14 MED ORDER — ONDANSETRON HCL 4 MG/2ML IJ SOLN: 4.0000 mg | Freq: Four times a day (QID) | INTRAMUSCULAR | Status: DC | PRN

## 2019-09-14 MED ORDER — FAMOTIDINE 20 MG PO TABS: 40.0000 mg | ORAL_TABLET | Freq: Once | ORAL | Status: DC | PRN

## 2019-09-14 MED ORDER — LIDOCAINE-EPINEPHRINE (PF) 1 %-1:200000 IJ SOLN
INTRAMUSCULAR | Status: DC | PRN
  Administered 2019-09-14: 10 mL

## 2019-09-14 MED ORDER — MIDAZOLAM HCL 2 MG/ML PO SYRP: 8.0000 mg | ORAL_SOLUTION | Freq: Once | ORAL | Status: DC | PRN

## 2019-09-14 MED ORDER — METHYLPREDNISOLONE SODIUM SUCC 125 MG IJ SOLR: 125.0000 mg | Freq: Once | INTRAMUSCULAR | Status: DC | PRN

## 2019-09-14 MED ORDER — FENTANYL CITRATE (PF) 100 MCG/2ML IJ SOLN
INTRAMUSCULAR | Status: AC
  Filled 2019-09-14: qty 4

## 2019-09-14 MED ORDER — CLINDAMYCIN PHOSPHATE 300 MG/50ML IV SOLN: 300.0000 mg | Freq: Once | INTRAVENOUS | Status: DC

## 2019-09-14 MED ORDER — DIPHENHYDRAMINE HCL 50 MG/ML IJ SOLN: 50.0000 mg | Freq: Once | INTRAMUSCULAR | Status: DC | PRN

## 2019-09-14 MED ORDER — HYDROMORPHONE HCL 1 MG/ML IJ SOLN: 1.0000 mg | Freq: Once | INTRAMUSCULAR | Status: DC | PRN

## 2019-09-14 MED ORDER — SODIUM CHLORIDE 0.9% IV SOLUTION: Freq: Once | INTRAVENOUS | Status: DC

## 2019-09-14 MED ORDER — MIDAZOLAM HCL 5 MG/5ML IJ SOLN
INTRAMUSCULAR | Status: AC
  Filled 2019-09-14: qty 5

## 2019-09-14 MED ORDER — CLINDAMYCIN PHOSPHATE 300 MG/50ML IV SOLN
INTRAVENOUS | Status: AC
  Filled 2019-09-14: qty 50

## 2019-09-14 SURGICAL SUPPLY — 3 items
KIT CATH DIALYSIS TRI 24X13 (CATHETERS) ×3 IMPLANT
SET INTRO CAPELLA COAXIAL (SET/KITS/TRAYS/PACK) ×3 IMPLANT
SUT SILK 0 FSL (SUTURE) ×3 IMPLANT

## 2019-09-14 NOTE — Plan of Care (Signed)
  Problem: Health Behavior/Discharge Planning: Goal: Ability to manage health-related needs will improve Outcome: Progressing   Problem: Clinical Measurements: Goal: Ability to maintain clinical measurements within normal limits will improve Outcome: Progressing Goal: Will remain free from infection Outcome: Progressing Goal: Diagnostic test results will improve Outcome: Progressing Goal: Respiratory complications will improve Outcome: Progressing Goal: Cardiovascular complication will be avoided Outcome: Progressing   Problem: Activity: Goal: Risk for activity intolerance will decrease Outcome: Progressing   Problem: Nutrition: Goal: Adequate nutrition will be maintained Outcome: Progressing   Problem: Elimination: Goal: Will not experience complications related to urinary retention Outcome: Progressing   Problem: Pain Managment: Goal: General experience of comfort will improve Outcome: Progressing   Problem: Skin Integrity: Goal: Risk for impaired skin integrity will decrease Outcome: Progressing  Pt daughter present this am and consented pt for perm cath placement and for blood transfusion x 1 unit for hgb 7.1.  Pt is confused this am but denies pain or needs

## 2019-09-14 NOTE — NC FL2 (Signed)
[place] LEVEL OF CARE SCREENING TOOL     IDENTIFICATION  Patient Name: Andrew Valentine Birthdate: 1940/01/14 Sex: male Admission Date (Current Location): 09/08/2019  [place] and [place] Number:  Engineering geologist and Address:  [facility], [address], [place], [place] [address]      Provider Number: [id number]  Attending Physician Name and Address:  Ottie Glazier, MD  Relative Name and Phone Number:  Daneil Dan daughter [phone]    Current Level of Care: Hospital Recommended Level of Care: [place] Prior Approval Number:    Date Approved/Denied:   PASRR Number: [id number]  Discharge Plan: SNF    Current Diagnoses: Patient Active Problem List   Diagnosis Date Noted  . Pressure injury of skin 09/09/2019  . Hyperkalemia   . NSTEMI (non-ST elevated myocardial infarction) ([place])   . Acute hypoxemic respiratory failure ([place]) 09/08/2019  . Acute renal failure ([place]) 09/08/2019  . Acute renal failure with tubular necrosis ([place])   . Hypoxia   . Hypotension due to hypovolemia   . Atrial fibrillation ([facility])     Orientation RESPIRATION BLADDER Height & Weight     Self, Time, Situation, Place  O2 Continent Weight: (!) 163.3 kg Height:  5\' 8"  (172.7 cm)  BEHAVIORAL SYMPTOMS/MOOD NEUROLOGICAL BOWEL NUTRITION STATUS      Continent Diet (diabetic heart Healthy)  AMBULATORY STATUS COMMUNICATION OF NEEDS Skin   Extensive Assist Verbally Normal                       Personal Care Assistance Level of Assistance  Bathing, Dressing Bathing Assistance: Limited assistance   Dressing Assistance: Limited assistance     Functional Limitations Info             SPECIAL CARE FACTORS FREQUENCY  PT (By licensed PT), OT (By licensed OT)     PT Frequency: 5 times a week OT Frequency: 5 times a week            Contractures Contractures Info: Not present    Additional Factors Info  Code Status, Allergies Code  Status Info: partial code Allergies Info: Penicillin           Current Medications (09/14/2019):  This is the current hospital active medication list Current Facility-Administered Medications  Medication Dose Route Frequency Provider Last Rate Last Admin  . acetaminophen (TYLENOL) tablet 650 mg  650 mg Oral Q6H PRN Fritzi Mandes, MD       Or  . acetaminophen (TYLENOL) suppository 650 mg  650 mg Rectal Q6H PRN Fritzi Mandes, MD      . amoxicillin-clavulanate (AUGMENTIN) 875-125 MG per tablet 1 tablet  1 tablet Oral Q12H Ottie Glazier, MD   1 tablet at 09/13/19 2202  . Chlorhexidine Gluconate Cloth 2 % PADS 6 each  6 each Topical Daily Ottie Glazier, MD   6 each at 09/14/19 0534  . fludrocortisone (FLORINEF) tablet 0.2 mg  0.2 mg Oral BID Ottie Glazier, MD   0.2 mg at 09/13/19 2138  . heparin ADULT infusion 100 units/mL (25000 units/235mL sodium chloride 0.45%)  1,600 Units/hr Intravenous Continuous Hart Robinsons A, RPH 16 mL/hr at 09/14/19 0035 1,600 Units/hr at 09/14/19 0035  . heparin sodium (porcine) injection 1,000-6,000 Units  1,000-6,000 Units Intracatheter PRN Fritzi Mandes, MD      . hydrocortisone sodium succinate (SOLU-CORTEF) 100 MG injection 100 mg  100 mg Intravenous Q12H Ottie Glazier, MD   100 mg at 09/14/19 0118  .  insulin aspart (novoLOG) injection 0-15 Units  0-15 Units Subcutaneous Q4H Ottie Glazier, MD   2 Units at 09/14/19 0352  . ipratropium-albuterol (DUONEB) 0.5-2.5 (3) MG/3ML nebulizer solution 3 mL  3 mL Nebulization TID Tukov-Yual, Magdalene S, NP   3 mL at 09/13/19 1927  . levothyroxine (SYNTHROID) tablet 175 mcg  175 mcg Oral Daily Fritzi Mandes, MD   175 mcg at 09/13/19 [phone]  . MEDLINE mouth rinse  15 mL Mouth Rinse BID Ottie Glazier, MD   15 mL at 09/13/19 2142  . midodrine (PROAMATINE) tablet 10 mg  10 mg Oral TID WC Ottie Glazier, MD   10 mg at 09/13/19 1636  . norepinephrine (LEVOPHED) 16 mg in 242mL premix infusion  0-40 mcg/min Intravenous Titrated  Ottie Glazier, MD   Stopped at 09/13/19 1354  . nystatin cream (MYCOSTATIN)   Topical BID Ottie Glazier, MD   Given at 09/13/19 2138  . ondansetron (ZOFRAN) tablet 4 mg  4 mg Oral Q6H PRN Fritzi Mandes, MD       Or  . ondansetron [facility]) injection 4 mg  4 mg Intravenous Q6H PRN Fritzi Mandes, MD      . pantoprazole (PROTONIX) injection 40 mg  40 mg Intravenous Q24H Tukov-Yual, Magdalene S, NP   40 mg at 09/14/19 0534  . polyethylene glycol (MIRALAX / GLYCOLAX) packet 17 g  17 g Oral Daily PRN Fritzi Mandes, MD      . senna [facility]) tablet 8.6 mg  1 tablet Oral BID Fritzi Mandes, MD   8.6 mg at 09/13/19 2138  . zinc oxide (BALMEX) 33.3 % cream 1 application  1 application Topical Daily Ottie Glazier, MD   1 application at 54/56/25 0805     Discharge Medications: Please see discharge summary for a list of discharge medications.  Relevant Imaging Results:  Relevant Lab Results:   Additional Information SS# [id number]  Su Hilt, RN

## 2019-09-14 NOTE — Consult Note (Signed)
ANTICOAGULATION CONSULT NOTE   Pharmacy Consult for heparin dosing  Indication: chest pain/ACS  Patient Measurements: Height: 5\' 8"  (172.7 cm) Weight: (!) 163.3 kg (360 lb 0.2 oz) IBW/kg (Calculated) : 68.4 Heparin Dosing Weight: 104.8  Vital Signs: Temp: 97.7 F (36.5 C) (09/03 1523) Temp Source: Oral (09/03 1523) BP: 127/69 (09/03 1523) Pulse Rate: 87 (09/03 1523)  Labs: Recent Labs    09/11/19 2325 09/12/19 0419 09/12/19 0711 09/12/19 0711 09/12/19 1807 09/13/19 0259 09/13/19 2208 09/14/19 0312  HGB  --   --  8.8*   < >  --  8.7*  --  7.1*  HCT  --   --  28.8*  --   --  29.5*  --  22.7*  PLT  --   --  129*  --   --  140*  --  122*  HEPARINUNFRC   < >  --  0.79*   < > 0.80* 0.62 0.26*  --   CREATININE  --  1.69*  --   --   --  1.54*  --  2.23*   < > = values in this interval not displayed.    Estimated Creatinine Clearance: 40.4 mL/min (A) (by C-G formula based on SCr of 2.23 mg/dL (H)).   Medical History: Past Medical History:  Diagnosis Date  . Congestive heart failure (CHF) ([place])   . Hypertension   . Hypothyroidism   . Morbid obesity ([place])   . Obesity hypoventilation syndrome ([place])   . Type 2 diabetes mellitus (HCC)     Medications:  Scheduled:  . sodium chloride   Intravenous Once  . amoxicillin-clavulanate  1 tablet Oral Q12H  . Chlorhexidine Gluconate Cloth  6 each Topical Daily  . fentaNYL      . fludrocortisone  0.2 mg Oral BID  . hydrocortisone sod succinate (SOLU-CORTEF) inj  100 mg Intravenous Q12H  . insulin aspart  0-15 Units Subcutaneous Q4H  . ipratropium-albuterol  3 mL Nebulization TID  . levothyroxine  175 mcg Oral Daily  . mouth rinse  15 mL Mouth Rinse BID  . midazolam      . midodrine  10 mg Oral TID WC  . nystatin cream   Topical BID  . pantoprazole (PROTONIX) IV  40 mg Intravenous Q24H  . senna  1 tablet Oral BID  . zinc oxide  1 application Topical Daily   Assessment: 79 year old male presenting to the ED with AMS,  hypoglycemia, and hypotension started on a heparin infusion for atrial fibrillation (onset unclear) being followed by cardiology. H&H, platelets have been trending down since admission. Following the previous note heparin was held for approximately 2 hours  Goal of Therapy:  Heparin level 0.3-0.7 units/ml Monitor platelets by anticoagulation protocol: Yes   Plan:   Heparin was restarted at 1415 at 1500 units/hr  Re-check anti-Xa level in 8 hours   Check CBC daily while on heparin infusion per protocol  0902 @ 2208 HL 0.26, SUBtherapeutic, will increase Heparin to 1600 units/hr and recheck HL in 8 hours  Update @ 1523: Hgb is 7.1 and patient received 1 unit of PRBC. D/w Dr. Rockey Situ to hold heparin at this time.   Rowland Lathe 09/14/2019 3:23 PM

## 2019-09-14 NOTE — Progress Notes (Signed)
[facility]  ROUNDING NOTE   Subjective:   Patient resting in bed, in no acute distress,plan to change dialysis access to Permacath today. Vascular consult in process.Daughter present in the room and talked to the son over the phone,updated about patient's general condition.   Objective:  Vital signs in last 24 hours:  Temp:  [97.7 F (36.5 C)-98.3 F (36.8 C)] 97.7 F (36.5 C) (09/03 1523) Pulse Rate:  [72-94] 87 (09/03 1523) Resp:  [13-24] 17 (09/03 1444) BP: (100-142)/(56-73) 127/69 (09/03 1523) SpO2:  [92 %-100 %] 96 % (09/03 1523) Weight:  [163.3 kg] 163.3 kg (09/03 1134)  Weight change:  Filed Weights   09/12/19 0419 09/13/19 0437 09/14/19 1134  Weight: (!) 163.3 kg (!) 163.3 kg (!) 163.3 kg    Intake/Output: I/O last 3 completed shifts: In: 729.4 [P.O.:25; I.V.:417.6; IV Piggyback:286.8] Out: 524 [Urine:410; Other:114]   Intake/Output this shift:  No intake/output data recorded.  Physical Exam: General: Lying in bed, in no acute distress, daughter at bedside  Head: Normocephalic, atraumatic. Moist oral mucosal membranes  Eyes: Anicteric, PERRL  Neck: Supple, obese  Lungs:  Diminished bilaterally, dyspnoea on exertion,3L [place] O2  Heart: Regular rate and rhythm  Abdomen:  Soft, nontender, obese  Extremities: ++ peripheral edema.  Neurologic: Nonfocal, moving all four extremities, answering simple questions intermittently.   Skin: No lesions  Access: LIJ temp HD catheter 8/29 ICU    Basic Metabolic Panel: Recent Labs  Lab 09/09/19 1613 09/09/19 1613 09/10/19 0409 09/10/19 0409 09/11/19 0433 09/11/19 0433 09/12/19 0419 09/13/19 0259 09/14/19 0312  NA 141   < > 139  --  136  --  135 134* 135  K 6.0*   < > 5.2*  --  4.2  --  3.7 4.3 3.9  CL 112*   < > 110  --  105  --  104 104 102  CO2 20*   < > 25  --  25  --  27 26 27   GLUCOSE 115*   < > 136*  --  158*  --  154* 147* 149*  BUN 95*   < > 82*  --  51*  --  42* 45* 74*  CREATININE 4.44*    < > 3.67*  --  2.05*  --  1.69* 1.54* 2.23*  CALCIUM 8.2*   < > 7.9*   < > 8.4*   < > 8.9 8.4* 8.3*  MG 2.2   < > 2.2  --  2.0  --  2.0 2.1 1.9  PHOS 7.6*  --   --   --  3.8  --  2.9 2.3* 2.4*  2.5   < > = values in this interval not displayed.    Liver Function Tests: Recent Labs  Lab 09/08/19 1205 09/09/19 0329 09/14/19 0312  AST 25 48*  --   ALT 16 19  --   ALKPHOS 89 88  --   BILITOT 1.5* 1.3*  --   PROT 7.7 7.2  --   ALBUMIN 3.0* 2.7* 2.3*   No results for input(s): LIPASE, AMYLASE in the last 168 hours. No results for input(s): AMMONIA in the last 168 hours.  CBC: Recent Labs  Lab 09/08/19 1205 09/09/19 0329 09/10/19 0409 09/11/19 0433 09/12/19 0711 09/13/19 0259 09/14/19 0312  WBC 10.8*   < > 11.5* 11.4* 11.8* 11.9* 11.0*  NEUTROABS 8.9*  --   --   --   --   --   --  HGB 11.2*   < > 10.3* 10.0* 8.8* 8.7* 7.1*  HCT 37.7*   < > 34.1* 31.7* 28.8* 29.5* 22.7*  MCV 104.1*   < > 104.0* 99.4 103.6* 105.0* 99.6  PLT 174   < > 156 137* 129* 140* 122*   < > = values in this interval not displayed.    Cardiac Enzymes: Recent Labs  Lab 09/09/19 0329  CKTOTAL 2,443*    BNP: Invalid input(s): POCBNP  CBG: Recent Labs  Lab 09/13/19 1924 09/14/19 0025 09/14/19 0034 09/14/19 0344 09/14/19 1523  GLUCAP 130* 148* 130* 147* 145*    Microbiology: Results for orders placed or performed during the hospital encounter of 09/08/19  MRSA PCR Screening     Status: None   Collection Time: 09/08/19 11:49 AM   Specimen: Nasopharyngeal  Result Value Ref Range Status   MRSA by PCR NEGATIVE NEGATIVE Final    Comment:        The GeneXpert MRSA Assay (FDA approved for NASAL specimens only), is one component of a comprehensive MRSA colonization surveillance program. It is not intended to diagnose MRSA infection nor to guide or monitor treatment for MRSA infections. Performed at [facility], [address] [place], [place] [address]   SARS Coronavirus  2 by RT PCR (hospital order, performed in [facility] hospital lab) Nasopharyngeal Nasopharyngeal Swab     Status: None   Collection Time: 09/08/19 12:05 PM   Specimen: Nasopharyngeal Swab  Result Value Ref Range Status   SARS Coronavirus 2 NEGATIVE NEGATIVE Final    Comment: (NOTE) SARS-CoV-2 target nucleic acids are NOT DETECTED.  The SARS-CoV-2 RNA is generally detectable in upper and lower respiratory specimens during the acute phase of infection. The lowest concentration of SARS-CoV-2 viral copies this assay can detect is 250 copies / mL. A negative result does not preclude SARS-CoV-2 infection and should not be used as the sole basis for treatment or other patient management decisions.  A negative result may occur with improper specimen collection / handling, submission of specimen other than nasopharyngeal swab, presence of viral mutation(s) within the areas targeted by this assay, and inadequate number of viral copies (<250 copies / mL). A negative result must be combined with clinical observations, patient history, and epidemiological information.  Fact Sheet for Patients:   StrictlyIdeas.no  Fact Sheet for Healthcare Providers: BankingDealers.co.za  This test is not yet approved or  cleared by the [place] FDA and has been authorized for detection and/or diagnosis of SARS-CoV-2 by FDA under an Emergency Use Authorization (EUA).  This EUA will remain in effect (meaning this test can be used) for the duration of the COVID-19 declaration under Section 564(b)(1) of the Act, 21 U.S.C. section 360bbb-3(b)(1), unless the authorization is terminated or revoked sooner.  Performed at [facility], [address] [place], [place] [address]   Blood culture (routine x 2)     Status: Abnormal   Collection Time: 09/08/19 12:07 PM   Specimen: BLOOD  Result Value Ref Range Status   Specimen Description   Final    BLOOD L  DISTAL FOREARM Performed at [facility], [address] [place], [place] [address]    Special Requests   Final    BOTTLES DRAWN AEROBIC AND ANAEROBIC Blood Culture results may not be optimal due to an excessive volume of blood received in culture bottles Performed at [facility], [address] [place], [place] [address]    Culture  Setup Time  Final    GRAM POSITIVE COCCI AEROBIC BOTTLE ONLY CRITICAL RESULT CALLED TO, READ BACK BY AND VERIFIED WITH: MORGAN CUNNINGHAM  AT 2751 ON 09/09/19 SNG    Culture (A)  Final    STAPHYLOCOCCUS EPIDERMIDIS THE SIGNIFICANCE OF ISOLATING THIS ORGANISM FROM A SINGLE SET OF BLOOD CULTURES WHEN MULTIPLE SETS ARE DRAWN IS UNCERTAIN. PLEASE NOTIFY THE MICROBIOLOGY DEPARTMENT WITHIN ONE WEEK IF SPECIATION AND SENSITIVITIES ARE REQUIRED. Performed at [facility], [place] [address], [place], [place] [address]    Report Status 09/11/2019 FINAL  Final  Blood Culture ID Panel (Reflexed)     Status: Abnormal   Collection Time: 09/08/19 12:07 PM  Result Value Ref Range Status   Enterococcus faecalis NOT DETECTED NOT DETECTED Final   Enterococcus Faecium NOT DETECTED NOT DETECTED Final   Listeria monocytogenes NOT DETECTED NOT DETECTED Final   Staphylococcus species DETECTED (A) NOT DETECTED Final    Comment: CRITICAL RESULT CALLED TO, READ BACK BY AND VERIFIED WITH: MORGAN CUNNINGHAM AT 1345 ON 09/09/19 SNG    Staphylococcus aureus (BCID) NOT DETECTED NOT DETECTED Final   Staphylococcus epidermidis DETECTED (A) NOT DETECTED Final    Comment: CRITICAL RESULT CALLED TO, READ BACK BY AND VERIFIED WITH: MORGAN CUNNINGHAM AT 1345 ON 09/09/19 SNG    Staphylococcus lugdunensis NOT DETECTED NOT DETECTED Final   Streptococcus species NOT DETECTED NOT DETECTED Final   Streptococcus agalactiae NOT DETECTED NOT DETECTED Final   Streptococcus pneumoniae NOT DETECTED NOT DETECTED Final   Streptococcus pyogenes NOT DETECTED NOT DETECTED Final    A.calcoaceticus-baumannii NOT DETECTED NOT DETECTED Final   Bacteroides fragilis NOT DETECTED NOT DETECTED Final   Enterobacterales NOT DETECTED NOT DETECTED Final   Enterobacter cloacae complex NOT DETECTED NOT DETECTED Final   Escherichia coli NOT DETECTED NOT DETECTED Final   Klebsiella aerogenes NOT DETECTED NOT DETECTED Final   Klebsiella oxytoca NOT DETECTED NOT DETECTED Final   Klebsiella pneumoniae NOT DETECTED NOT DETECTED Final   Proteus species NOT DETECTED NOT DETECTED Final   Salmonella species NOT DETECTED NOT DETECTED Final   Serratia marcescens NOT DETECTED NOT DETECTED Final   Haemophilus influenzae NOT DETECTED NOT DETECTED Final   Neisseria meningitidis NOT DETECTED NOT DETECTED Final   Pseudomonas aeruginosa NOT DETECTED NOT DETECTED Final   Stenotrophomonas maltophilia NOT DETECTED NOT DETECTED Final   Candida albicans NOT DETECTED NOT DETECTED Final   Candida auris NOT DETECTED NOT DETECTED Final   Candida glabrata NOT DETECTED NOT DETECTED Final   Candida krusei NOT DETECTED NOT DETECTED Final   Candida parapsilosis NOT DETECTED NOT DETECTED Final   Candida tropicalis NOT DETECTED NOT DETECTED Final   Cryptococcus neoformans/gattii NOT DETECTED NOT DETECTED Final   Methicillin resistance mecA/C NOT DETECTED NOT DETECTED Final    Comment: Performed at [facility], [address] [place], [place] [address]  Blood culture (routine x 2)     Status: None   Collection Time: 09/08/19 12:59 PM   Specimen: BLOOD  Result Value Ref Range Status   Specimen Description BLOOD LEFT UPPER FORARM  Final   Special Requests   Final    BOTTLES DRAWN AEROBIC AND ANAEROBIC Blood Culture results may not be optimal due to an excessive volume of blood received in culture bottles   Culture   Final    NO GROWTH 5 DAYS Performed at [facility], [address], [place], [place] [address]    Report Status 09/13/2019 FINAL  Final    Coagulation Studies:  No  results for input(s): LABPROT, INR in the last 72 hours.  Urinalysis: No results for input(s): COLORURINE, LABSPEC, PHURINE, GLUCOSEU, HGBUR, BILIRUBINUR, KETONESUR, PROTEINUR, UROBILINOGEN, NITRITE, LEUKOCYTESUR in the last 72 hours.  Invalid input(s): APPERANCEUR    Imaging: PERIPHERAL VASCULAR CATHETERIZATION  Result Date: 09/14/2019 See Op Note  DG Chest Port 1 View  Result Date: 09/14/2019 CLINICAL DATA:  PICC line placement. EXAM: PORTABLE CHEST 1 VIEW COMPARISON:  Radiographs earlier the same date and 09/11/2019. FINDINGS: 1416 hours. There is a new left IJ hemodialysis catheter, projecting to the level of the mid right atrium. No visible PICC line. Stable cardiomegaly, aortic atherosclerosis and mild pulmonary edema. There are probable small bilateral pleural effusions. No pneumothorax. The bones appear unchanged. IMPRESSION: 1. New left IJ hemodialysis catheter tip projects to the level of the mid right atrium. No visible PICC line. 2. Stable cardiomegaly, mild pulmonary edema and probable small bilateral pleural effusions. Electronically Signed   By: Richardean Sale M.D.   On: 09/14/2019 14:37   DG Chest Port 1 View  Result Date: 09/14/2019 CLINICAL DATA:  Acute respiratory failure with hypoxic EXAM: PORTABLE CHEST 1 VIEW COMPARISON:  09/11/2019 FINDINGS: Cardiomegaly and bilateral airspace opacities are again noted. No large pleural effusion or pneumothorax identified. No acute bony abnormalities are present. There has been little interval change since prior study except for removal of a LEFT IJ central venous catheter. IMPRESSION: Little significant change in bilateral airspace opacities and cardiomegaly. Electronically Signed   By: Margarette Canada M.D.   On: 09/14/2019 05:58     Medications:   . clindamycin     . sodium chloride   Intravenous Once  . amoxicillin-clavulanate  1 tablet Oral Q12H  . Chlorhexidine Gluconate Cloth  6 each Topical Daily  . fentaNYL      .  fludrocortisone  0.2 mg Oral BID  . hydrocortisone sod succinate (SOLU-CORTEF) inj  100 mg Intravenous Q12H  . insulin aspart  0-15 Units Subcutaneous Q4H  . ipratropium-albuterol  3 mL Nebulization TID  . levothyroxine  175 mcg Oral Daily  . mouth rinse  15 mL Mouth Rinse BID  . midazolam      . midodrine  10 mg Oral TID WC  . nystatin cream   Topical BID  . pantoprazole (PROTONIX) IV  40 mg Intravenous Q24H  . senna  1 tablet Oral BID  . zinc oxide  1 application Topical Daily   acetaminophen **OR** acetaminophen, heparin sodium (porcine), HYDROmorphone (DILAUDID) injection, ondansetron **OR** ondansetron (ZOFRAN) IV, ondansetron (ZOFRAN) IV, polyethylene glycol  Assessment/ Plan:  Mr. Girolamo Lortie is a 79 y.o. 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, hypoventilation syndrome, hyperlipidemia who is admitted to [facility] on 09/08/2019 for Acidosis [E87.2] Hyperkalemia [E87.5] ATN (acute tubular necrosis) (HCC) [N17.0] Acute renal failure with tubular necrosis (HCC) [N17.0] Acute renal failure (HCC) [N17.9] Hypoxia [R09.02] NSTEMI (non-ST elevated myocardial infarction) ([place]) [I21.4] AKI (acute kidney injury) ([place]) [N17.9] Acute hypoxemic respiratory failure (HCC) [J96.01] Hypotension due to hypovolemia [I95.89, E86.1]  1. Acute renal failure with hyperkalemia: on chronic kidney disease stage IIIB.  Creatinine baseline reported to be 1.5 by son.  Nonoliguric urine output.  - required renal replacement therapy. CVVHDF from 8/29 to 9/2 - Monitor daily for dialysis need  Plan on intermittent hemodialysis treatment.  Planning  tunneled dialysis catheter placement today and dialysis after that.  2. Hypotension: requiring vasopressors - norepinephrine. With cardiogenic and septic shock  from pneumonia - Appreciate cardiology input.   3. Acute respiratory failure with respiratory acidosis:  Acute  exacerbation of chronic diastolic congestive heart failure  Appreciate pulmonary input.  On empiric antibiotics: Augmentin and azithromycin  Palliative care involved in the care. Overall prognosis is poor, discussion ongoing with patient and family regarding goals of care.  Patient was seen and examined with Crosby Oyster, DNP. Additionally patient was unable to lay flat for tunneled catheter placement so his Left IJ temporary dialysis catheter was exchanged. Scheduled for intermittent hemodialysis later today. Above plan was discussed and agreed upon on the signing of this note.    LOS: 6 Shenita Trego 9/3/20214:31 PM

## 2019-09-14 NOTE — Progress Notes (Signed)
Received order from Dtr Aleskerov to dc heparin gtt due to hgb of 7.1 - platelets 122. Heparin discontinued at this time

## 2019-09-14 NOTE — Progress Notes (Signed)
Progress Note  Patient Name: Andrew Valentine Date of Encounter: 09/14/2019  [facility] Cardiologist: on [place] out of the unit last night Family reports he is more confused this morning Laying in bed, reports feels comfortable, denies shortness of breath   Inpatient Medications    Scheduled Meds: . [MAR Hold] sodium chloride   Intravenous Once  . [MAR Hold] amoxicillin-clavulanate  1 tablet Oral Q12H  . [MAR Hold] Chlorhexidine Gluconate Cloth  6 each Topical Daily  . [MAR Hold] fludrocortisone  0.2 mg Oral BID  . [MAR Hold] hydrocortisone sod succinate (SOLU-CORTEF) inj  100 mg Intravenous Q12H  . [MAR Hold] insulin aspart  0-15 Units Subcutaneous Q4H  . [MAR Hold] ipratropium-albuterol  3 mL Nebulization TID  . [MAR Hold] levothyroxine  175 mcg Oral Daily  . [MAR Hold] mouth rinse  15 mL Mouth Rinse BID  . [MAR Hold] midodrine  10 mg Oral TID WC  . [MAR Hold] nystatin cream   Topical BID  . [MAR Hold] pantoprazole (PROTONIX) IV  40 mg Intravenous Q24H  . [MAR Hold] senna  1 tablet Oral BID  . [MAR Hold] zinc oxide  1 application Topical Daily   Continuous Infusions: . sodium chloride    . clindamycin (CLEOCIN) IV     PRN Meds: [MAR Hold] acetaminophen **OR** [MAR Hold] acetaminophen, diphenhydrAMINE, famotidine, [MAR Hold] heparin sodium (porcine), [MAR Hold]  HYDROmorphone (DILAUDID) injection, methylPREDNISolone (SOLU-MEDROL) injection, midazolam, [MAR Hold] ondansetron **OR** [MAR Hold] ondansetron (ZOFRAN) IV, [MAR Hold] ondansetron (ZOFRAN) IV, [MAR Hold] polyethylene glycol   Vital Signs    Vitals:   09/14/19 0020 09/14/19 0344 09/14/19 0736 09/14/19 1134  BP: 110/71 138/64 135/67 (!) 142/73  Pulse: 87 84 94 94  Resp: 20 18 13  (!) 23  Temp: 98.3 F (36.8 C) 98 F (36.7 C) 97.8 F (36.6 C) 97.8 F (36.6 C)  TempSrc: Axillary Oral Oral Oral  SpO2: 100% 97% 95% 100%  Weight:    (!) 163.3 kg  Height:    5\' 8"  (1.727 m)    Intake/Output  Summary (Last 24 hours) at 09/14/2019 1150 Last data filed at 09/13/2019 1800 Gross per 24 hour  Intake 196.94 ml  Output 275 ml  Net -78.06 ml   Last 3 Weights 09/14/2019 09/13/2019 09/12/2019  Weight (lbs) 360 lb 0.2 oz 360 lb 0.2 oz 360 lb 0.2 oz  Weight (kg) 163.3 kg 163.3 kg 163.3 kg      Telemetry    Atrial fibrillation - Personally Reviewed  ECG    - Personally Reviewed  Physical Exam   Constitutional: Morbidly obese HENT:  Head: Normocephalic and atraumatic.  Eyes:  no discharge. No scleral icterus.  Neck: Normal range of motion. Neck supple.  Unable to estimate JVD Cardiovascular: Normal rate, regular rhythm, normal heart sounds and intact distal pulses. Exam reveals no gallop and no friction rub. No edema No murmur heard. Pulmonary/Chest: Effort normal and breath sounds normal. No stridor. No respiratory distress.  no wheezes.  no rales.  no tenderness.  Abdominal: Soft.  no distension.  no tenderness.  Musculoskeletal: Normal range of motion.  no  tenderness or deformity.  Neurological:  normal muscle tone. Coordination normal. No atrophy Skin: Skin is warm and dry. No rash noted. not diaphoretic.  Psychiatric: Confused  Labs    High Sensitivity Troponin:   Recent Labs  Lab 09/08/19 1205 09/08/19 1607 09/09/19 0607 09/09/19 1004  TROPONINIHS 181* 445* 1,541* 2,333*  Chemistry Recent Labs  Lab 09/08/19 1205 09/08/19 1205 09/09/19 0329 09/09/19 1004 09/12/19 0419 09/13/19 0259 09/14/19 0312  NA 140   < > 139   < > 135 134* 135  K 5.3*   < > 5.3*   < > 3.7 4.3 3.9  CL 108   < > 109   < > 104 104 102  CO2 24   < > 22   < > 27 26 27   GLUCOSE 88   < > 86   < > 154* 147* 149*  BUN 94*   < > 94*   < > 42* 45* 74*  CREATININE 4.47*   < > 4.49*   < > 1.69* 1.54* 2.23*  CALCIUM 8.0*   < > 7.7*   < > 8.9 8.4* 8.3*  PROT 7.7  --  7.2  --   --   --   --   ALBUMIN 3.0*  --  2.7*  --   --   --  2.3*  AST 25  --  48*  --   --   --   --   ALT 16  --  19  --    --   --   --   ALKPHOS 89  --  88  --   --   --   --   BILITOT 1.5*  --  1.3*  --   --   --   --   GFRNONAA 12*   < > 12*   < > 38* 42* 27*  GFRAA 14*   < > 13*   < > 44* 49* 31*  ANIONGAP 8   < > 8   < > 4* 4* 6   < > = values in this interval not displayed.     Hematology Recent Labs  Lab 09/12/19 0711 09/13/19 0259 09/14/19 0312  WBC 11.8* 11.9* 11.0*  RBC 2.78* 2.81* 2.28*  HGB 8.8* 8.7* 7.1*  HCT 28.8* 29.5* 22.7*  MCV 103.6* 105.0* 99.6  MCH 31.7 31.0 31.1  MCHC 30.6 29.5* 31.3  RDW 17.2* 17.2* 17.2*  PLT 129* 140* 122*    BNP Recent Labs  Lab 09/08/19 1205  BNP 280.3*     DDimer No results for input(s): DDIMER in the last 168 hours.   Radiology    DG Chest Port 1 View  Result Date: 09/14/2019 CLINICAL DATA:  Acute respiratory failure with hypoxic EXAM: PORTABLE CHEST 1 VIEW COMPARISON:  09/11/2019 FINDINGS: Cardiomegaly and bilateral airspace opacities are again noted. No large pleural effusion or pneumothorax identified. No acute bony abnormalities are present. There has been little interval change since prior study except for removal of a LEFT IJ central venous catheter. IMPRESSION: Little significant change in bilateral airspace opacities and cardiomegaly. Electronically Signed   By: Margarette Canada M.D.   On: 09/14/2019 05:58    Cardiac Studies   Echo 1. Left ventricular ejection fraction, by estimation, is 55 to 60%. The  left ventricle has normal function. The left ventricle has no regional  wall motion abnormalities. Left ventricular diastolic parameters are  indeterminate. Septal flattening in  systole consistent with elevated right heart pressures.  2. Right ventricular systolic function was not well visualized. The right  ventricular size is moderately enlarged. There is severely elevated  pulmonary artery systolic pressure. The estimated right ventricular  systolic pressure is 54.0 mmHg.  3. Left atrial size was mildly dilated.    Patient  Profile  Andrew Valentine is a 79 y.o. male with a hx of morbid obesity, obstructive sleep apnea, obesity hypoventilation syndrome, noncompliant with CPAP , diabetes , presenting with hyperglycemia, hypoxia, hypotension, noted to be in atrial fibrillation in the emergency room  Assessment & Plan    Atrial fibrillation No plan to restore normal sinus rhythm, He is on heparin with plan to transition to a NOAC Rate relatively well controlled  Acute respiratory distress Encephalopathy on arrival secondary to hypercarbia On arrival requiring BiPAP, with improvement of symptoms over several days Secondary to pulmonary hypertension, obesity hypoventilation, sleep apnea, -Would continue gentle diuresis, also has need for dialysis  Pulmonary hypertension Likely secondary to chronic poorly controlled sleep apnea, morbid obesity hypoventilation, --As above BiPAP when sleeping and napping -If renal function stable, would continue Lasix given pulmonary hypertension on echocardiogram --- May get better fluid management with regular hemodialysis if patient wishes  Anemia Plan for transfusion May need EPO  Renal failure,  stage IIIb By report baseline creatinine 1.5 Presenting with creatinine greater than 4 Plan for dialysis  Obstructive sleep apnea, obesity hypoventilation CPAP ordered  Long discussion with son on the phone from [place], daughter at the bedside and with patient, case discussed with palliative care Also discussed with pulmonary Orders placed for CPAP  Total encounter time more than 35 minutes  Greater than 50% was spent in counseling and coordination of care with the patient    For questions or updates, please contact [facility] Please consult [email or web address] for contact info under        Signed, Ida Rogue, MD  09/14/2019, 11:50 AM

## 2019-09-14 NOTE — Op Note (Signed)
  OPERATIVE NOTE   PROCEDURE: 1. Insertion of temporary dialysis catheter catheter left IJ approach.  PRE-OPERATIVE DIAGNOSIS: Acute on chronic renal insufficiency requiring hemodialysis  POST-OPERATIVE DIAGNOSIS: Same  SURGEON: Katha Cabal M.D.  ANESTHESIA: 1% lidocaine local infiltration  ESTIMATED BLOOD LOSS: Minimal cc  INDICATIONS:   Andrew Valentine is a 79 y.o. male who presents with worsening renal function poor cardiac function and anasarca.  He was scheduled for a permacath however his overall fluid balance has not been corrected to a point where he can lay flat and therefore I am replacing his temporary catheter.  The risks benefits as well as the indication was discussed with his family and they have agreed to proceed..  DESCRIPTION: After obtaining full informed written consent, the patient was positioned supine. The left neck and chest wall was prepped and draped in a sterile fashion. Ultrasound was placed in a sterile sleeve. Ultrasound was utilized to identify the left internal jugular vein which is noted to be echolucent and compressible indicating patency. Images recorded for the permanent record. Under real-time visualization a Seldinger needle is inserted into the vein and the guidewires advanced without difficulty. Small counterincision was made at the wire insertion site. Dilator is passed over the wire and the temporary dialysis catheter catheter is fed over the wire without difficulty.  All lumens aspirate and flush easily and are packed with heparin saline. Catheter secured to the skin of the left neck with 2-0 silk. A sterile dressing is applied with Biopatch.  COMPLICATIONS: None  CONDITION: Unchanged  Hortencia Pilar Office:  [phone] 09/14/2019, 2:28 PM

## 2019-09-14 NOTE — Progress Notes (Signed)
Daily Progress Note   Patient Name: Andrew Valentine       Date: 09/14/2019 DOB: 1940/07/21  Age: 79 y.o. MRN#: [id number] Attending Physician: Ottie Glazier, MD Primary Care Physician: [place] [place] Date: 09/08/2019  Reason for Consultation/Follow-up: Establishing goals of care  Subjective: Patient is resting in bed. He was moved out of ICU since last meeting yesterday. He is confused and is not answering questions appropriately this morning. We discussed his [place] and [place] states [place].   Unable to revisit [place] and code status today given patient's confusion, and family's desire to determine and honor his wishes. Recommend outpatient palliative to help with [place] outpatient.   If mental status does not improve in the next 24-48 hours, would recommend initiating Risperdal. Could start at 0.5mg  BID for one day, and increasing to 1mg  BID the following day. Can continue to titrate up as needed to help with confusion.   Length of Stay: 6  Current Medications: Scheduled Meds:  . sodium chloride   Intravenous Once  . amoxicillin-clavulanate  1 tablet Oral Q12H  . Chlorhexidine Gluconate Cloth  6 each Topical Daily  . fludrocortisone  0.2 mg Oral BID  . hydrocortisone sod succinate (SOLU-CORTEF) inj  100 mg Intravenous Q12H  . insulin aspart  0-15 Units Subcutaneous Q4H  . ipratropium-albuterol  3 mL Nebulization TID  . levothyroxine  175 mcg Oral Daily  . mouth rinse  15 mL Mouth Rinse BID  . midodrine  10 mg Oral TID WC  . nystatin cream   Topical BID  . pantoprazole (PROTONIX) IV  40 mg Intravenous Q24H  . senna  1 tablet Oral BID  . zinc oxide  1 application Topical Daily    Continuous  Infusions: . norepinephrine (LEVOPHED) Adult infusion Stopped (09/13/19 1354)    PRN Meds: acetaminophen **OR** acetaminophen, heparin sodium (porcine), ondansetron **OR** ondansetron (ZOFRAN) IV, polyethylene glycol  Physical Exam Constitutional:      Comments: Confused  Pulmonary:     Effort: Pulmonary effort is normal.  Neurological:     Mental Status: He is alert.             Vital Signs: BP 135/67 (BP Location: Right Arm)   Pulse 94  Temp 97.8 F (36.6 C) (Oral)   Resp 13   Ht 5\' 8"  (1.727 m)   Wt (!) 163.3 kg   SpO2 95%   BMI 54.74 kg/m  SpO2: SpO2: 95 % O2 Device: O2 Device: Nasal Cannula O2 Flow Rate: O2 Flow Rate (L/min): 3 L/min  Intake/output summary:   Intake/Output Summary (Last 24 hours) at 09/14/2019 1028 Last data filed at 09/13/2019 1800 Gross per 24 hour  Intake 196.94 ml  Output 275 ml  Net -78.06 ml   LBM: Last BM Date: 09/10/19 Baseline Weight: Weight: (!) 149.7 kg Most recent weight: Weight: (!) 163.3 kg       Palliative Assessment/Data:    Flowsheet Rows     Most Recent Value  Intake Tab  Referral Department Critical care  Unit at Time of Referral ICU  Date Notified 09/11/19  Palliative Care Type New Palliative care  Reason for referral Clarify Goals of Care  Date of Admission 09/08/19  # of days IP prior to Palliative referral 3  Clinical Assessment  Psychosocial & Spiritual Assessment  Palliative Care Outcomes      Patient Active Problem List   Diagnosis Date Noted  . Pressure injury of skin 09/09/2019  . Hyperkalemia   . NSTEMI (non-ST elevated myocardial infarction) ([place])   . Acute hypoxemic respiratory failure ([place]) 09/08/2019  . Acute renal failure ([place]) 09/08/2019  . Acute renal failure with tubular necrosis ([place])   . Hypoxia   . Hypotension due to hypovolemia   . Atrial fibrillation [facility])     Palliative Care Assessment & Plan   Recommendations/Plan:  Patient is confused and unable to continue [place]  conversation today.  DNI.   Do CPR and ACLS, but no intubation. (Outcome with this decision was discussed in detail yesterday with patient and both children).   Do HD.    Code Status:    Code Status Orders  (From admission, onward)         Start     Ordered   09/13/19 1312  Limited resuscitation (code)  Continuous       Question Answer Comment  In the event of cardiac or respiratory ARREST: Initiate Code Blue, Call Rapid Response Yes   In the event of cardiac or respiratory ARREST: Perform CPR Yes   In the event of cardiac or respiratory ARREST: Perform Intubation/Mechanical Ventilation No   In the event of cardiac or respiratory ARREST: Use NIPPV/BiPAp only if indicated Yes   In the event of cardiac or respiratory ARREST: Administer ACLS medications if indicated Yes   In the event of cardiac or respiratory ARREST: Perform Defibrillation or Cardioversion if indicated Yes   Comments MOST form on chart.      09/13/19 1312        Code Status History    Date Active Date Inactive Code Status Order ID Comments User Context   09/12/2019 1623 09/13/2019 1312 DNR [id number]  Asencion Gowda, NP Inpatient   09/08/2019 1850 09/12/2019 1623 Full Code [id number]  Fritzi Mandes, MD ED   09/08/2019 1845 09/08/2019 1850 Full Code [id number]  Fritzi Mandes, MD ED   09/08/2019 1551 09/08/2019 1845 DNR [id number]  Fritzi Mandes, MD ED   Advance Care Planning Activity       Prognosis:   Poor overall  Care plan was discussed with CCM- NP  Thank you for allowing the Palliative Medicine Team to assist in the care of this patient.   Total Time 35 min Prolonged Time Billed  no      Greater than 50%  of this time was spent counseling and coordinating care related to the above assessment and plan.  Asencion Gowda, NP  Please contact Palliative Medicine Team phone at [phone] for questions and concerns.

## 2019-09-14 NOTE — TOC Progression Note (Signed)
Transition of Care [facility]) - Progression Note    Patient Details  Name: Andrew Valentine MRN: [id number] Date of Birth: 1940-07-06  Transition of Care [facility]) CM/SW [place], RN Phone Number: 09/14/2019, 9:02 AM  Clinical Narrative:   The patient lives ina  Mobile home with a ramped entrance, his daughter lives next door however he lives alone, he is non complaint with a CPAP, he has a Corporate investment banker at home, he is needing a 3+ assist. FL2, PASSR and bedsearch completed to go to SNF, will review bed offers once obtained         Expected Discharge Plan and Services                                                 Social Determinants of Health (SDOH) Interventions    Readmission Risk Interventions No flowsheet data found.

## 2019-09-14 NOTE — Progress Notes (Signed)
CRITICAL CARE PROGRESS NOTE    Name: Hawthorne Day MRN: [id number] DOB: [birth year]     LOS: 6   SUBJECTIVE FINDINGS & SIGNIFICANT EVENTS    Patient description:  47M hx of Morbid obesity BMI >50, CHF, DM, hypothoroidism, came from home due to dehydration and AKI.  He lives alone and cannot take care of himself. He was noted to be lethargic in ED with hypercapnia and hypoxemia.  I spoke to daughter she states she came to check on him and blood glucose was 47, he was unable to get out of chair. He was weak could not walk, desaturating 70s.  He was encephalopathic at this time. Daughter lives next door.    SIGNIFICANT EVENTS: 09/10/19- patient is awake this am, he on levphed 25, hes on CRRT with dyasylate modifcation this am by renal team. S/p wound care eval, UOP has improved.   8/31- patient weaned off vasopressin, weaned levophed from 25>>10.  9/1 - patient was evaluated by [facility], he does not wish to have permanent dialysis.  Code status discussion in progress.  9/2- Plan to remove Left IJ HD catheter, trial of Lasix as per Nephrology 9/3- patient reports feeling well, he ate dinner states he feels well. S/p vascular eval for perm cath placement. 1 unit prbc transfustion ordered, heparin stopped due to borderline low h/h Lines/tubes : Urethral Catheter Maureen RN Straight-tip 16 Fr. (Active)  Indication for Insertion or Continuance of Catheter Unstable critically ill patients first 24-48 hours (See Criteria) 09/09/19 0729  Site Assessment Clean;Intact 09/09/19 0729  Catheter Maintenance Bag below level of bladder;Drainage bag/tubing not touching floor;No dependent loops;Seal intact 09/09/19 0729  Collection Container Standard drainage bag 09/09/19 0729  Securement Method Securing device (Describe) 09/09/19 0729    Urinary Catheter Interventions (if applicable) Unclamped 46/56/81 0729  Output (mL) 40 mL 09/09/19 0400    Microbiology/Sepsis markers: Results for orders placed or performed during the hospital encounter of 09/08/19  MRSA PCR Screening     Status: None   Collection Time: 09/08/19 11:49 AM   Specimen: Nasopharyngeal  Result Value Ref Range Status   MRSA by PCR NEGATIVE NEGATIVE Final    Comment:        The GeneXpert MRSA Assay (FDA approved for NASAL specimens only), is one component of a comprehensive MRSA colonization surveillance program. It is not intended to diagnose MRSA infection nor to guide or monitor treatment for MRSA infections. Performed at [facility], [address] [place], [place] [address]   SARS Coronavirus 2 by RT PCR (hospital order, performed in [facility] hospital lab) Nasopharyngeal Nasopharyngeal Swab     Status: None   Collection Time: 09/08/19 12:05 PM   Specimen: Nasopharyngeal Swab  Result Value Ref Range Status   SARS Coronavirus 2 NEGATIVE NEGATIVE Final    Comment: (NOTE) SARS-CoV-2 target nucleic acids are NOT DETECTED.  The SARS-CoV-2 RNA is generally detectable in upper and lower respiratory specimens during the acute phase of infection. The lowest concentration of SARS-CoV-2 viral copies this assay can detect is 250 copies / mL. A negative result does not preclude SARS-CoV-2 infection and should not be used as the sole basis for treatment or other patient management decisions.  A negative result may occur with improper specimen collection / handling, submission of specimen other than nasopharyngeal swab, presence of viral mutation(s) within the areas targeted by this assay, and inadequate number of viral copies (<250 copies / mL). A negative result must be combined with clinical observations,  patient history, and epidemiological information.  Fact Sheet for Patients:   StrictlyIdeas.no  Fact  Sheet for Healthcare Providers: BankingDealers.co.za  This test is not yet approved or  cleared by the [place] FDA and has been authorized for detection and/or diagnosis of SARS-CoV-2 by FDA under an Emergency Use Authorization (EUA).  This EUA will remain in effect (meaning this test can be used) for the duration of the COVID-19 declaration under Section 564(b)(1) of the Act, 21 U.S.C. section 360bbb-3(b)(1), unless the authorization is terminated or revoked sooner.  Performed at [facility], [address] [place], [place] [address]   Blood culture (routine x 2)     Status: Abnormal   Collection Time: 09/08/19 12:07 PM   Specimen: BLOOD  Result Value Ref Range Status   Specimen Description   Final    BLOOD L DISTAL FOREARM Performed at [facility], [address], [place], [place] [address]    Special Requests   Final    BOTTLES DRAWN AEROBIC AND ANAEROBIC Blood Culture results may not be optimal due to an excessive volume of blood received in culture bottles Performed at [facility], [address] [place], [place] [address]    Culture  Setup Time   Final    GRAM POSITIVE COCCI AEROBIC BOTTLE ONLY CRITICAL RESULT CALLED TO, READ BACK BY AND VERIFIED WITH: MORGAN CUNNINGHAM  AT 5188 ON 09/09/19 SNG    Culture (A)  Final    STAPHYLOCOCCUS EPIDERMIDIS THE SIGNIFICANCE OF ISOLATING THIS ORGANISM FROM A SINGLE SET OF BLOOD CULTURES WHEN MULTIPLE SETS ARE DRAWN IS UNCERTAIN. PLEASE NOTIFY THE MICROBIOLOGY DEPARTMENT WITHIN ONE WEEK IF SPECIATION AND SENSITIVITIES ARE REQUIRED. Performed at [facility], [place] [address] [place], [place] [address]    Report Status 09/11/2019 FINAL  Final  Blood Culture ID Panel (Reflexed)     Status: Abnormal   Collection Time: 09/08/19 12:07 PM  Result Value Ref Range Status   Enterococcus faecalis NOT DETECTED NOT DETECTED Final   Enterococcus Faecium NOT DETECTED NOT DETECTED  Final   Listeria monocytogenes NOT DETECTED NOT DETECTED Final   Staphylococcus species DETECTED (A) NOT DETECTED Final    Comment: CRITICAL RESULT CALLED TO, READ BACK BY AND VERIFIED WITH: MORGAN CUNNINGHAM AT 1345 ON 09/09/19 SNG    Staphylococcus aureus (BCID) NOT DETECTED NOT DETECTED Final   Staphylococcus epidermidis DETECTED (A) NOT DETECTED Final    Comment: CRITICAL RESULT CALLED TO, READ BACK BY AND VERIFIED WITH: MORGAN CUNNINGHAM AT 1345 ON 09/09/19 SNG    Staphylococcus lugdunensis NOT DETECTED NOT DETECTED Final   Streptococcus species NOT DETECTED NOT DETECTED Final   Streptococcus agalactiae NOT DETECTED NOT DETECTED Final   Streptococcus pneumoniae NOT DETECTED NOT DETECTED Final   Streptococcus pyogenes NOT DETECTED NOT DETECTED Final   A.calcoaceticus-baumannii NOT DETECTED NOT DETECTED Final   Bacteroides fragilis NOT DETECTED NOT DETECTED Final   Enterobacterales NOT DETECTED NOT DETECTED Final   Enterobacter cloacae complex NOT DETECTED NOT DETECTED Final   Escherichia coli NOT DETECTED NOT DETECTED Final   Klebsiella aerogenes NOT DETECTED NOT DETECTED Final   Klebsiella oxytoca NOT DETECTED NOT DETECTED Final   Klebsiella pneumoniae NOT DETECTED NOT DETECTED Final   Proteus species NOT DETECTED NOT DETECTED Final   Salmonella species NOT DETECTED NOT DETECTED Final   Serratia marcescens NOT DETECTED NOT DETECTED Final   Haemophilus influenzae NOT DETECTED NOT DETECTED Final   Neisseria meningitidis NOT DETECTED NOT DETECTED Final  Pseudomonas aeruginosa NOT DETECTED NOT DETECTED Final   Stenotrophomonas maltophilia NOT DETECTED NOT DETECTED Final   Candida albicans NOT DETECTED NOT DETECTED Final   Candida auris NOT DETECTED NOT DETECTED Final   Candida glabrata NOT DETECTED NOT DETECTED Final   Candida krusei NOT DETECTED NOT DETECTED Final   Candida parapsilosis NOT DETECTED NOT DETECTED Final   Candida tropicalis NOT DETECTED NOT DETECTED Final    Cryptococcus neoformans/gattii NOT DETECTED NOT DETECTED Final   Methicillin resistance mecA/C NOT DETECTED NOT DETECTED Final    Comment: Performed at [facility], [address] [place], [place] [address]  Blood culture (routine x 2)     Status: None   Collection Time: 09/08/19 12:59 PM   Specimen: BLOOD  Result Value Ref Range Status   Specimen Description BLOOD LEFT UPPER FORARM  Final   Special Requests   Final    BOTTLES DRAWN AEROBIC AND ANAEROBIC Blood Culture results may not be optimal due to an excessive volume of blood received in culture bottles   Culture   Final    NO GROWTH 5 DAYS Performed at [facility], [address], [place], [place] [address]    Report Status 09/13/2019 FINAL  Final    Anti-infectives:  Anti-infectives (From admission, onward)   Start     Dose/Rate Route Frequency Ordered Stop   09/14/19 1202  clindamycin (CLEOCIN) 300 MG/50ML IVPB       Note to Pharmacy: Birdie Sons   : cabinet override      09/14/19 1202 09/15/19 0014   09/14/19 1200  clindamycin (CLEOCIN) IVPB 300 mg  Status:  Discontinued       Note to Pharmacy: To be given in specials   300 mg 100 mL/hr over 30 Minutes Intravenous  Once 09/14/19 1133 09/14/19 1437   09/12/19 1100  amoxicillin-clavulanate (AUGMENTIN) 875-125 MG per tablet 1 tablet        1 tablet Oral Every 12 hours 09/12/19 1012     09/09/19 2200  fluconazole (DIFLUCAN) IVPB 400 mg  Status:  Discontinued        400 mg 100 mL/hr over 120 Minutes Intravenous Every 24 hours 09/09/19 1300 09/12/19 1012   09/09/19 1500  cefTRIAXone (ROCEPHIN) 2 g in sodium chloride 0.9 % 100 mL IVPB  Status:  Discontinued        2 g 200 mL/hr over 30 Minutes Intravenous Every 24 hours 09/09/19 1321 09/12/19 1012   09/09/19 1000  azithromycin (ZITHROMAX) tablet 250 mg       "Followed by" Linked Group Details   250 mg Oral Daily 09/08/19 1437 09/13/19 0959   09/08/19 2151  fluconazole (DIFLUCAN) IVPB 200 mg  Status:   Discontinued        200 mg 100 mL/hr over 60 Minutes Intravenous Every 24 hours 09/08/19 1850 09/09/19 1300   09/08/19 1445  cefTRIAXone (ROCEPHIN) 1 g in sodium chloride 0.9 % 100 mL IVPB  Status:  Discontinued        1 g 200 mL/hr over 30 Minutes Intravenous Every 24 hours 09/08/19 1436 09/09/19 1321   09/08/19 1445  azithromycin (ZITHROMAX) tablet 500 mg       "Followed by" Linked Group Details   500 mg Oral Daily 09/08/19 1436 09/08/19 1548       Consults: Treatment Team:  Minna Merritts, MD Liana Gerold, MD     PAST MEDICAL HISTORY   Past Medical History:  Diagnosis Date   Congestive heart  failure (CHF) ([place])    Hypertension    Hypothyroidism    Morbid obesity ([place])    Obesity hypoventilation syndrome ([place])    Type 2 diabetes mellitus ([place])      SURGICAL HISTORY   Past Surgical History:  Procedure Laterality Date   DIALYSIS/PERMA CATHETER INSERTION N/A 09/14/2019   Procedure: DIALYSIS/PERMA CATHETER INSERTION;  Surgeon: Katha Cabal, MD;  Location: [facility];  Service: Cardiovascular;  Laterality: N/A;   none       FAMILY HISTORY   Family History  Family history unknown: Yes     SOCIAL HISTORY   Social History   Tobacco Use   Smoking status: Former Smoker   Smokeless tobacco: Never Used  Substance Use Topics   Alcohol use: Not Currently   Drug use: Never     MEDICATIONS   Current Medication:  Current Facility-Administered Medications:    0.9 %  sodium chloride infusion (Manually program via Guardrails IV Fluids), , Intravenous, Once, Ottie Glazier, MD   acetaminophen (TYLENOL) tablet 650 mg, 650 mg, Oral, Q6H PRN **OR** acetaminophen (TYLENOL) suppository 650 mg, 650 mg, Rectal, Q6H PRN, Fritzi Mandes, MD   amoxicillin-clavulanate (AUGMENTIN) 875-125 MG per tablet 1 tablet, 1 tablet, Oral, Q12H, Ottie Glazier, MD, 1 tablet at 09/13/19 2202   Chlorhexidine Gluconate Cloth 2 % PADS 6 each, 6 each,  Topical, Daily, Ottie Glazier, MD, 6 each at 09/14/19 0534   clindamycin (CLEOCIN) 300 MG/50ML IVPB, , , ,    fentaNYL (SUBLIMAZE) 100 MCG/2ML injection, , , ,    fludrocortisone (FLORINEF) tablet 0.2 mg, 0.2 mg, Oral, BID, Lanney Gins, Kiaya Haliburton, MD, 0.2 mg at 09/13/19 2138   heparin sodium (porcine) injection 1,000-6,000 Units, 1,000-6,000 Units, Intracatheter, PRN, Fritzi Mandes, MD   hydrocortisone sodium succinate (SOLU-CORTEF) 100 MG injection 100 mg, 100 mg, Intravenous, Q12H, Lanney Gins, Anastasija Anfinson, MD, 100 mg at 09/14/19 0118   HYDROmorphone (DILAUDID) injection 1 mg, 1 mg, Intravenous, Once PRN, Stegmayer, Kimberly A, PA-C   insulin aspart (novoLOG) injection 0-15 Units, 0-15 Units, Subcutaneous, Q4H, Stephaie Dardis, MD, 2 Units at 09/14/19 0352   ipratropium-albuterol (DUONEB) 0.5-2.5 (3) MG/3ML nebulizer solution 3 mL, 3 mL, Nebulization, TID, Tukov-Yual, Magdalene S, NP, 3 mL at 09/13/19 1927   levothyroxine (SYNTHROID) tablet 175 mcg, 175 mcg, Oral, Daily, Fritzi Mandes, MD, 175 mcg at 09/13/19 0609   MEDLINE mouth rinse, 15 mL, Mouth Rinse, BID, Lanney Gins, Winnona Wargo, MD, 15 mL at 09/13/19 2142   midazolam (VERSED) 5 MG/5ML injection, , , ,    midodrine (PROAMATINE) tablet 10 mg, 10 mg, Oral, TID WC, Lanney Gins, Emika Tiano, MD, 10 mg at 09/13/19 1636   nystatin cream (MYCOSTATIN), , Topical, BID, Ottie Glazier, MD, Given at 09/13/19 2138   ondansetron (ZOFRAN) tablet 4 mg, 4 mg, Oral, Q6H PRN **OR** ondansetron (ZOFRAN) injection 4 mg, 4 mg, Intravenous, Q6H PRN, Fritzi Mandes, MD   ondansetron (ZOFRAN) injection 4 mg, 4 mg, Intravenous, Q6H PRN, Stegmayer, Kimberly A, PA-C   pantoprazole (PROTONIX) injection 40 mg, 40 mg, Intravenous, Q24H, Tukov-Yual, Magdalene S, NP, 40 mg at 09/14/19 0534   polyethylene glycol (MIRALAX / GLYCOLAX) packet 17 g, 17 g, Oral, Daily PRN, Fritzi Mandes, MD   senna (SENOKOT) tablet 8.6 mg, 1 tablet, Oral, BID, Fritzi Mandes, MD, 8.6 mg at 09/13/19 2138   zinc oxide  (BALMEX) 09.6 % cream 1 application, 1 application, Topical, Daily, Ottie Glazier, MD, 1 application at 28/36/62 0805    ALLERGIES   Penicillins    REVIEW  OF SYSTEMS    Unable to obtain due to lethargy  PHYSICAL EXAMINATION   Vital Signs: Temp:  [97.7 F (36.5 C)-98.3 F (36.8 C)] 97.7 F (36.5 C) (09/03 1523) Pulse Rate:  [72-94] 87 (09/03 1523) Resp:  [13-24] 17 (09/03 1444) BP: (100-142)/(56-73) 127/69 (09/03 1523) SpO2:  [92 %-100 %] 96 % (09/03 1523) Weight:  [163.3 kg] 163.3 kg (09/03 1134)  GENERAL: Acutely ill appearing male, sitting on bed, on 3L [place] HEAD: Atraumatic, normocephalic, neck supple, no JVD   EYES:Pupils PERRLA, No scleral icterus  MOUTH: Moist mucus membranes NECK: Supple, no JVD  PULMONARY: Decreased breath sounds bilaterally, even, nonlabored CARDIOVASCULAR: Regular rate & rhythm, s1s2, no M/R/G GASTROINTESTINAL: excoriated rash with skin breakdown of anterior lowe abdominal pannus. Obese, soft, nontender, nondistended, no guarding or rebound tenderness MUSCULOSKELETAL: No clubbing or swelling  NEUROLOGIC:  Awake, alert, oriented to place and time, follows commands, no focal deficits, speech clear SKIN: Warm and dry.  No obvious rashes, lesions, or ulcerations   PERTINENT DATA     Infusions:  clindamycin     Scheduled Medications:  sodium chloride   Intravenous Once   amoxicillin-clavulanate  1 tablet Oral Q12H   Chlorhexidine Gluconate Cloth  6 each Topical Daily   fentaNYL       fludrocortisone  0.2 mg Oral BID   hydrocortisone sod succinate (SOLU-CORTEF) inj  100 mg Intravenous Q12H   insulin aspart  0-15 Units Subcutaneous Q4H   ipratropium-albuterol  3 mL Nebulization TID   levothyroxine  175 mcg Oral Daily   mouth rinse  15 mL Mouth Rinse BID   midazolam       midodrine  10 mg Oral TID WC   nystatin cream   Topical BID   pantoprazole (PROTONIX) IV  40 mg Intravenous Q24H   senna  1 tablet Oral BID   zinc  oxide  1 application Topical Daily   PRN Medications: acetaminophen **OR** acetaminophen, heparin sodium (porcine), HYDROmorphone (DILAUDID) injection, ondansetron **OR** ondansetron (ZOFRAN) IV, ondansetron (ZOFRAN) IV, polyethylene glycol Hemodynamic parameters:   Intake/Output: 09/02 0701 - 09/03 0700 In: 414.1 [P.O.:25; I.V.:159; IV Piggyback:230.2] Out: 300 [Urine:300]  Ventilator  Settings:     LAB RESULTS:  Basic Metabolic Panel: Recent Labs  Lab 09/09/19 1613 09/09/19 1613 09/10/19 0409 09/10/19 0409 09/11/19 0433 09/11/19 0433 09/12/19 0419 09/12/19 0419 09/13/19 0259 09/14/19 0312  NA 141   < > 139  --  136  --  135  --  134* 135  K 6.0*   < > 5.2*   < > 4.2   < > 3.7   < > 4.3 3.9  CL 112*   < > 110  --  105  --  104  --  104 102  CO2 20*   < > 25  --  25  --  27  --  26 27  GLUCOSE 115*   < > 136*  --  158*  --  154*  --  147* 149*  BUN 95*   < > 82*  --  51*  --  42*  --  45* 74*  CREATININE 4.44*   < > 3.67*  --  2.05*  --  1.69*  --  1.54* 2.23*  CALCIUM 8.2*   < > 7.9*  --  8.4*  --  8.9  --  8.4* 8.3*  MG 2.2   < > 2.2  --  2.0  --  2.0  --  2.1 1.9  PHOS 7.6*  --   --   --  3.8  --  2.9  --  2.3* 2.4*   2.5   < > = values in this interval not displayed.   Liver Function Tests: Recent Labs  Lab 09/08/19 1205 09/09/19 0329 09/14/19 0312  AST 25 48*  --   ALT 16 19  --   ALKPHOS 89 88  --   BILITOT 1.5* 1.3*  --   PROT 7.7 7.2  --   ALBUMIN 3.0* 2.7* 2.3*   No results for input(s): LIPASE, AMYLASE in the last 168 hours. No results for input(s): AMMONIA in the last 168 hours. CBC: Recent Labs  Lab 09/08/19 1205 09/09/19 0329 09/10/19 0409 09/11/19 0433 09/12/19 0711 09/13/19 0259 09/14/19 0312  WBC 10.8*   < > 11.5* 11.4* 11.8* 11.9* 11.0*  NEUTROABS 8.9*  --   --   --   --   --   --   HGB 11.2*   < > 10.3* 10.0* 8.8* 8.7* 7.1*  HCT 37.7*   < > 34.1* 31.7* 28.8* 29.5* 22.7*  MCV 104.1*   < > 104.0* 99.4 103.6* 105.0* 99.6  PLT 174    < > 156 137* 129* 140* 122*   < > = values in this interval not displayed.   Cardiac Enzymes: Recent Labs  Lab 09/09/19 0329  CKTOTAL 2,443*   BNP: Invalid input(s): POCBNP CBG: Recent Labs  Lab 09/13/19 1924 09/14/19 0025 09/14/19 0034 09/14/19 0344 09/14/19 1523  GLUCAP 130* 148* 130* 147* 145*       IMAGING RESULTS:  Imaging: PERIPHERAL VASCULAR CATHETERIZATION  Result Date: 09/14/2019 See Op Note  DG Chest Port 1 View  Result Date: 09/14/2019 CLINICAL DATA:  PICC line placement. EXAM: PORTABLE CHEST 1 VIEW COMPARISON:  Radiographs earlier the same date and 09/11/2019. FINDINGS: 1416 hours. There is a new left IJ hemodialysis catheter, projecting to the level of the mid right atrium. No visible PICC line. Stable cardiomegaly, aortic atherosclerosis and mild pulmonary edema. There are probable small bilateral pleural effusions. No pneumothorax. The bones appear unchanged. IMPRESSION: 1. New left IJ hemodialysis catheter tip projects to the level of the mid right atrium. No visible PICC line. 2. Stable cardiomegaly, mild pulmonary edema and probable small bilateral pleural effusions. Electronically Signed   By: Richardean Sale M.D.   On: 09/14/2019 14:37   DG Chest Port 1 View  Result Date: 09/14/2019 CLINICAL DATA:  Acute respiratory failure with hypoxic EXAM: PORTABLE CHEST 1 VIEW COMPARISON:  09/11/2019 FINDINGS: Cardiomegaly and bilateral airspace opacities are again noted. No large pleural effusion or pneumothorax identified. No acute bony abnormalities are present. There has been little interval change since prior study except for removal of a LEFT IJ central venous catheter. IMPRESSION: Little significant change in bilateral airspace opacities and cardiomegaly. Electronically Signed   By: Margarette Canada M.D.   On: 09/14/2019 05:58   @[email or web address] @ PERIPHERAL VASCULAR CATHETERIZATION  Result Date: 09/14/2019 See Op Note  DG Chest Port 1 View  Result Date: 09/14/2019 CLINICAL  DATA:  PICC line placement. EXAM: PORTABLE CHEST 1 VIEW COMPARISON:  Radiographs earlier the same date and 09/11/2019. FINDINGS: 1416 hours. There is a new left IJ hemodialysis catheter, projecting to the level of the mid right atrium. No visible PICC line. Stable cardiomegaly, aortic atherosclerosis and mild pulmonary edema. There are probable small bilateral pleural effusions. No pneumothorax. The bones appear unchanged. IMPRESSION: 1. New left IJ hemodialysis catheter tip projects to the  level of the mid right atrium. No visible PICC line. 2. Stable cardiomegaly, mild pulmonary edema and probable small bilateral pleural effusions. Electronically Signed   By: Richardean Sale M.D.   On: 09/14/2019 14:37   DG Chest Port 1 View  Result Date: 09/14/2019 CLINICAL DATA:  Acute respiratory failure with hypoxic EXAM: PORTABLE CHEST 1 VIEW COMPARISON:  09/11/2019 FINDINGS: Cardiomegaly and bilateral airspace opacities are again noted. No large pleural effusion or pneumothorax identified. No acute bony abnormalities are present. There has been little interval change since prior study except for removal of a LEFT IJ central venous catheter. IMPRESSION: Little significant change in bilateral airspace opacities and cardiomegaly. Electronically Signed   By: Margarette Canada M.D.   On: 09/14/2019 05:58       ASSESSMENT AND PLAN     Acute Hypoxic Respiratory Failure -negative COVID -8/29 -due to most likely OHS/chronic thoracic restriction with concomitant OSA -background of CHF- repeat TTE  -CXR reviewd independently by me- cardiomegaly, mild interstitial infiltrates with platelike atelectasis bilaterally. Repeat CXR 8/31-with more appreciable right sided infiltrate suggestive of asp pna vs cap-zitrhromax and rocephin -empirically treating for CAP-will deescalate as appropriate -currently on BIPAP- ABG with mixed acid base disorder , have increased driving pressure and plan for repeat ABG today -reviewed care plan  with Daughter today- -patient was more interactive today   Acute Renal failure- stage 5 - oliguric -Monitor I&O's / urinary output -Follow BMP -Ensure adequate renal perfusion -Avoid nephrotoxic agents as able -Replace electrolytes as indicated -Nephrology following, appreicate input -CRRT stopped 9/2; plan for trial of Lasix -Temporary HD catheter to be removed today -After discussion with family and Palliative Care, pt wishes to proceed with HD ~ plan for PermCath placement tomorrow    Demand ischemia -Continuous cardiac monitoring -Maintain MAP >65 -Trend troponin -Cardiology following, appreciate input -s/p TTE with significantly elevated right heart pressures possibly due to pulmonary htn vs VTE.   -Continue Heparin gtt -[place] LE and d-dimer     Encephalopathy >>improved    -toxic metabolic vs hypercapnic.  -Continue BiPaP  Excoriated rash of abdominal pannus -dressing per RN -wound care consultation  -empiric abx    ID -continue IV abx as prescibed -follow up cultures  GI/Nutrition GI PROPHYLAXIS as indicated DIET-->TF's as tolerated Constipation protocol as indicated  ENDO - ICU hypoglycemic\Hyperglycemia protocol -check FSBS per protocol   ELECTROLYTES -follow labs as needed -replace as needed -pharmacy consultation   DVT/GI PRX ordered -SCDs  TRANSFUSIONS AS NEEDED MONITOR FSBS ASSESS the need for LABS as needed     Critical care provider statement:    Critical care time (minutes):  33   Critical care time was exclusive of:  Separately billable procedures and  treating other patients   Critical care was necessary to treat or prevent imminent or  life-threatening deterioration of the following conditions:  hypotension , AKI, OHS, morbid obesity , multiple comorbid conditions   Critical care was time spent personally by me on the following  activities:  Development of treatment plan with patient or surrogate,  discussions with consultants,  evaluation of patient's response to  treatment, examination of patient, obtaining history from patient or  surrogate, ordering and performing treatments and interventions, ordering  and review of laboratory studies and re-evaluation of patient's condition   I assumed direction of critical care for this patient from another  provider in my specialty: no  Ottie Glazier, M.D.  [facility] [place]

## 2019-09-14 NOTE — Consult Note (Signed)
ANTICOAGULATION CONSULT NOTE   Pharmacy Consult for heparin dosing  Indication: chest pain/ACS  Patient Measurements: Height: 5\' 8"  (172.7 cm) Weight: (!) 163.3 kg (360 lb 0.2 oz) IBW/kg (Calculated) : 68.4 Heparin Dosing Weight: 104.8  Vital Signs: Temp: 98.3 F (36.8 C) (09/03 0020) Temp Source: Axillary (09/03 0020) BP: 110/71 (09/03 0020) Pulse Rate: 87 (09/03 0020)  Labs: Recent Labs    09/11/19 0433 09/11/19 1440 09/11/19 2325 09/12/19 0419 09/12/19 0711 09/12/19 0711 09/12/19 1807 09/13/19 0259 09/13/19 2208  HGB 10.0*  --   --   --  8.8*  --   --  8.7*  --   HCT 31.7*  --   --   --  28.8*  --   --  29.5*  --   PLT 137*  --   --   --  129*  --   --  140*  --   HEPARINUNFRC 0.28*   < >   < >  --  0.79*   < > 0.80* 0.62 0.26*  CREATININE 2.05*  --   --  1.69*  --   --   --  1.54*  --    < > = values in this interval not displayed.    Estimated Creatinine Clearance: 58.5 mL/min (A) (by C-G formula based on SCr of 1.54 mg/dL (H)).   Medical History: Past Medical History:  Diagnosis Date  . Congestive heart failure (CHF) ([place])   . Hypertension   . Hypothyroidism   . Morbid obesity ([place])   . Obesity hypoventilation syndrome ([place])   . Type 2 diabetes mellitus ([facility])     Medications:  Scheduled:  . amoxicillin-clavulanate  1 tablet Oral Q12H  . Chlorhexidine Gluconate Cloth  6 each Topical Daily  . fludrocortisone  0.2 mg Oral BID  . hydrocortisone sod succinate (SOLU-CORTEF) inj  100 mg Intravenous Q12H  . insulin aspart  0-15 Units Subcutaneous Q4H  . ipratropium-albuterol  3 mL Nebulization TID  . levothyroxine  175 mcg Oral Daily  . mouth rinse  15 mL Mouth Rinse BID  . midodrine  10 mg Oral TID WC  . nystatin cream   Topical BID  . pantoprazole (PROTONIX) IV  40 mg Intravenous Q24H  . senna  1 tablet Oral BID  . zinc oxide  1 application Topical Daily   Assessment: 79 year old male presenting to the ED with AMS, hypoglycemia, and hypotension  started on a heparin infusion for atrial fibrillation (onset unclear) being followed by cardiology. H&H, platelets have been trending down since admission. Following the previous note heparin was held for approximately 2 hours  Goal of Therapy:  Heparin level 0.3-0.7 units/ml Monitor platelets by anticoagulation protocol: Yes   Plan:   Heparin was restarted at 1415 at 1500 units/hr  Re-check anti-Xa level in 8 hours   Check CBC daily while on heparin infusion per protocol  0902 @ 2208 HL 0.26, SUBtherapeutic, will increase Heparin to 1600 units/hr and recheck HL in 8 hours  Hart Robinsons A 09/14/2019 12:29 AM

## 2019-09-14 NOTE — Progress Notes (Signed)
OT Cancellation Note  Patient Details Name: Andrew Valentine MRN: [id number] DOB: January 19, 1940   Cancelled Treatment:    Reason Eval/Treat Not Completed: Patient at procedure or test/ unavailable. Pt out of room. Per chart, getting dialysis catheter. Will re-attempt OT tx at later date/time as medically appropriate and as pt is available.  Jeni Salles, MPH, MS, OTR/L ascom [phone] 09/14/19, 1:04 PM

## 2019-09-14 NOTE — Progress Notes (Signed)
PT Cancellation Note  Patient Details Name: Dontre Laduca MRN: [id number] DOB: [birth year]   Cancelled Treatment:    Reason Eval/Treat Not Completed: Patient at procedure or test/unavailable getting dialysis catheter per chart review.  Will attempt to see pt at a future date/time as medically appropriate.     Heloise Beecham Medora Roorda PT, DPT 09/14/19, 1:32 PM

## 2019-09-14 NOTE — Plan of Care (Signed)
  Problem: Health Behavior/Discharge Planning: Goal: Ability to manage health-related needs will improve 09/14/2019 1655 by Trula Slade, RN Outcome: Progressing 09/14/2019 1421 by Trula Slade, RN Outcome: Progressing   Problem: Clinical Measurements: Goal: Ability to maintain clinical measurements within normal limits will improve 09/14/2019 1655 by Trula Slade, RN Outcome: Progressing 09/14/2019 1421 by Trula Slade, RN Outcome: Progressing Goal: Will remain free from infection 09/14/2019 1655 by Trula Slade, RN Outcome: Progressing 09/14/2019 1421 by Trula Slade, RN Outcome: Progressing Goal: Diagnostic test results will improve 09/14/2019 1655 by Trula Slade, RN Outcome: Progressing 09/14/2019 1421 by Trula Slade, RN Outcome: Progressing Goal: Respiratory complications will improve 09/14/2019 1655 by Trula Slade, RN Outcome: Progressing 09/14/2019 1421 by Trula Slade, RN Outcome: Progressing Goal: Cardiovascular complication will be avoided 09/14/2019 1655 by Trula Slade, RN Outcome: Progressing 09/14/2019 1421 by Trula Slade, RN Outcome: Progressing   Problem: Activity: Goal: Risk for activity intolerance will decrease 09/14/2019 1655 by Trula Slade, RN Outcome: Progressing 09/14/2019 1421 by Trula Slade, RN Outcome: Progressing   Problem: Nutrition: Goal: Adequate nutrition will be maintained 09/14/2019 1655 by Trula Slade, RN Outcome: Progressing 09/14/2019 1421 by Trula Slade, RN Outcome: Progressing   Problem: Elimination: Goal: Will not experience complications related to urinary retention 09/14/2019 1655 by Trula Slade, RN Outcome: Progressing 09/14/2019 1421 by Trula Slade, RN Outcome: Progressing   Problem: Pain Managment: Goal: General experience of comfort will improve 09/14/2019 1655 by Trula Slade, RN Outcome: Progressing 09/14/2019 1421 by Trula Slade,  RN Outcome: Progressing   Problem: Skin Integrity: Goal: Risk for impaired skin integrity will decrease 09/14/2019 1655 by Trula Slade, RN Outcome: Progressing 09/14/2019 1421 by Trula Slade, RN Outcome: Progressing

## 2019-09-15 DIAGNOSIS — N1832 Chronic kidney disease, stage 3b: Secondary | ICD-10-CM

## 2019-09-15 DIAGNOSIS — I272 Pulmonary hypertension, unspecified: Secondary | ICD-10-CM

## 2019-09-15 LAB — GLUCOSE, CAPILLARY
Glucose-Capillary: 122 mg/dL — ABNORMAL HIGH (ref 70–99)
Glucose-Capillary: 135 mg/dL — ABNORMAL HIGH (ref 70–99)
Glucose-Capillary: 119 mg/dL — ABNORMAL HIGH (ref 70–99)
Glucose-Capillary: 119 mg/dL — ABNORMAL HIGH (ref 70–99)
Glucose-Capillary: 127 mg/dL — ABNORMAL HIGH (ref 70–99)

## 2019-09-15 LAB — CBC
HCT: 20.7 % — ABNORMAL LOW (ref 39.0–52.0)
Hemoglobin: 6.9 g/dL — ABNORMAL LOW (ref 13.0–17.0)
MCH: 31.7 pg (ref 26.0–34.0)
MCHC: 33.3 g/dL (ref 30.0–36.0)
MCV: 95 fL (ref 80.0–100.0)
Platelets: 112 10*3/uL — ABNORMAL LOW (ref 150–400)
RBC: 2.18 MIL/uL — ABNORMAL LOW (ref 4.22–5.81)
RDW: 18.4 % — ABNORMAL HIGH (ref 11.5–15.5)
WBC: 13.2 10*3/uL — ABNORMAL HIGH (ref 4.0–10.5)
nRBC: 1.4 % — ABNORMAL HIGH (ref 0.0–0.2)

## 2019-09-15 LAB — BASIC METABOLIC PANEL
Anion gap: 4 — ABNORMAL LOW (ref 5–15)
BUN: 60 mg/dL — ABNORMAL HIGH (ref 8–23)
CO2: 30 mmol/L (ref 22–32)
Calcium: 7.7 mg/dL — ABNORMAL LOW (ref 8.9–10.3)
Chloride: 102 mmol/L (ref 98–111)
Creatinine, Ser: 2.02 mg/dL — ABNORMAL HIGH (ref 0.61–1.24)
GFR calc Af Amer: 35 mL/min — ABNORMAL LOW (ref >60)
GFR calc non Af Amer: 30 mL/min — ABNORMAL LOW (ref >60)
Glucose, Bld: 122 mg/dL — ABNORMAL HIGH (ref 70–99)
Potassium: 3.7 mmol/L (ref 3.5–5.1)
Sodium: 136 mmol/L (ref 135–145)

## 2019-09-15 LAB — PREPARE RBC (CROSSMATCH)

## 2019-09-15 LAB — PHOSPHORUS: Phosphorus: 3.5 mg/dL (ref 2.5–4.6)

## 2019-09-15 LAB — MAGNESIUM: Magnesium: 1.8 mg/dL (ref 1.7–2.4)

## 2019-09-15 MED ORDER — SODIUM CHLORIDE 0.9% IV SOLUTION: Freq: Once | INTRAVENOUS | Status: AC

## 2019-09-15 NOTE — Progress Notes (Addendum)
PROGRESS NOTE    Andrew Valentine  HYI:[id number] DOB: 06/27/1940 DOA: 09/08/2019 PCP: [facility]    Brief Narrative:  [phone] hx of Morbid obesity BMI >50, CHF, DM, hypothoroidism, came from home due to dehydration and AKI.  He lives alone and cannot take care of himself. He was noted to be lethargic in ED with hypercapnia and hypoxemia.  I spoke to daughter she states she came to check on him and blood glucose was 47, he was unable to get out of chair. He was weak could not walk, desaturating 70s.  He was encephalopathic at this time. Daughter lives next door.    SIGNIFICANT EVENTS: 09/10/19- patient is awake this am, he on levphed 25, hes on CRRT with dyasylate modifcation this am by renal team. S/p wound care eval, UOP has improved.   8/31- patient weaned off vasopressin, weaned levophed from 25>>10.  9/1 - patient was evaluated by PALS, he does not wish to have permanent dialysis.  Code status discussion in progress.  9/2- Plan to remove Left IJ HD catheter, trial of Lasix as per Nephrology 9/3- patient reports feeling well, he ate dinner states he feels well. S/p vascular eval for perm cath placement. 1 unit prbc transfustion ordered, heparin stopped due to borderline low h/h     Consultants:   Pulmonary, cardiology, nephrology, vascular surgery  Procedures: Lines/tubes  Antimicrobials:  Augmentin   Subjective: Patient in hemodialysis has no complaints.  Tolerating dialysis will follow  Objective: Vitals:   09/15/19 0915 09/15/19 1145 09/15/19 1215 09/15/19 1400  BP:   (!) 104/50   Pulse:   79   Resp:   18   Temp: 97.8 F (36.6 C) (!) 97.1 F (36.2 C) (!) 97.5 F (36.4 C)   TempSrc:   Oral   SpO2:   93% 94%  Weight:      Height:        Intake/Output Summary (Last 24 hours) at 09/15/2019 1415 Last data filed at 09/15/2019 1145 Gross per 24 hour  Intake --  Output 501 ml  Net -501 ml   Filed Weights   09/12/19 0419 09/13/19 0437 09/14/19 1134   Weight: (!) 163.3 kg (!) 163.3 kg (!) 163.3 kg    Examination:  General exam: Appears calm and comfortable  Respiratory system: Anteriorly clear to auscultation. Respiratory effort normal. Cardiovascular system: S1 & S2 heard, RRR. No JVD, murmurs, rubs, gallops or clicks. Gastrointestinal system: Abdomen is nondistended, soft and nontender. Normal bowel sounds heard. Central nervous system: awake, responds to question Grossly appears intact Extremities:+bilateral lower extremity Skin: Warm dry Psychiatry:  Mood & affect appropriate.     Data Reviewed: I have personally reviewed following labs and imaging studies  CBC: Recent Labs  Lab 09/10/19 0409 09/11/19 0433 09/12/19 0711 09/13/19 0259 09/14/19 0312  WBC 11.5* 11.4* 11.8* 11.9* 11.0*  HGB 10.3* 10.0* 8.8* 8.7* 7.1*  HCT 34.1* 31.7* 28.8* 29.5* 22.7*  MCV 104.0* 99.4 103.6* 105.0* 99.6  PLT 156 137* 129* 140* 767*   Basic Metabolic Panel: Recent Labs  Lab 09/09/19 1613 09/09/19 1613 09/10/19 0409 09/11/19 0433 09/12/19 0419 09/13/19 0259 09/14/19 0312  NA 141   < > 139 136 135 134* 135  K 6.0*   < > 5.2* 4.2 3.7 4.3 3.9  CL 112*   < > 110 105 104 104 102  CO2 20*   < > 25 25 27 26 27   GLUCOSE 115*   < > 136* 158* 154* 147*  149*  BUN 95*   < > 82* 51* 42* 45* 74*  CREATININE 4.44*   < > 3.67* 2.05* 1.69* 1.54* 2.23*  CALCIUM 8.2*   < > 7.9* 8.4* 8.9 8.4* 8.3*  MG 2.2   < > 2.2 2.0 2.0 2.1 1.9  PHOS 7.6*  --   --  3.8 2.9 2.3* 2.4*  2.5   < > = values in this interval not displayed.   GFR: Estimated Creatinine Clearance: 40.4 mL/min (A) (by C-G formula based on SCr of 2.23 mg/dL (H)). Liver Function Tests: Recent Labs  Lab 09/09/19 0329 09/14/19 0312  AST 48*  --   ALT 19  --   ALKPHOS 88  --   BILITOT 1.3*  --   PROT 7.2  --   ALBUMIN 2.7* 2.3*   No results for input(s): LIPASE, AMYLASE in the last 168 hours. No results for input(s): AMMONIA in the last 168 hours. Coagulation Profile: No  results for input(s): INR, PROTIME in the last 168 hours. Cardiac Enzymes: Recent Labs  Lab 09/09/19 0329  CKTOTAL 2,443*   BNP (last 3 results) No results for input(s): PROBNP in the last 8760 hours. HbA1C: No results for input(s): HGBA1C in the last 72 hours. CBG: Recent Labs  Lab 09/14/19 2000 09/14/19 2335 09/15/19 0414 09/15/19 0834 09/15/19 1221  GLUCAP 130* 110* 122* 135* 119*   Lipid Profile: No results for input(s): CHOL, HDL, LDLCALC, TRIG, CHOLHDL, LDLDIRECT in the last 72 hours. Thyroid Function Tests: No results for input(s): TSH, T4TOTAL, FREET4, T3FREE, THYROIDAB in the last 72 hours. Anemia Panel: Recent Labs    09/14/19 0312  FERRITIN 55  TIBC 246*  IRON 69   Sepsis Labs: Recent Labs  Lab 09/09/19 0621 09/09/19 0630 09/10/19 0409 09/11/19 0433  PROCALCITON 0.16  --  0.62 0.47  LATICACIDVEN  --  1.2  --   --     Recent Results (from the past 240 hour(s))  MRSA PCR Screening     Status: None   Collection Time: 09/08/19 11:49 AM   Specimen: Nasopharyngeal  Result Value Ref Range Status   MRSA by PCR NEGATIVE NEGATIVE Final    Comment:        The GeneXpert MRSA Assay (FDA approved for NASAL specimens only), is one component of a comprehensive MRSA colonization surveillance program. It is not intended to diagnose MRSA infection nor to guide or monitor treatment for MRSA infections. Performed at [facility], [address] [place], [place] [address]   SARS Coronavirus 2 by RT PCR (hospital order, performed in [facility] hospital lab) Nasopharyngeal Nasopharyngeal Swab     Status: None   Collection Time: 09/08/19 12:05 PM   Specimen: Nasopharyngeal Swab  Result Value Ref Range Status   SARS Coronavirus 2 NEGATIVE NEGATIVE Final    Comment: (NOTE) SARS-CoV-2 target nucleic acids are NOT DETECTED.  The SARS-CoV-2 RNA is generally detectable in upper and lower respiratory specimens during the acute phase of infection. The  lowest concentration of SARS-CoV-2 viral copies this assay can detect is 250 copies / mL. A negative result does not preclude SARS-CoV-2 infection and should not be used as the sole basis for treatment or other patient management decisions.  A negative result may occur with improper specimen collection / handling, submission of specimen other than nasopharyngeal swab, presence of viral mutation(s) within the areas targeted by this assay, and inadequate number of viral copies (<250 copies / mL). A negative result must be combined  with clinical observations, patient history, and epidemiological information.  Fact Sheet for Patients:   StrictlyIdeas.no  Fact Sheet for Healthcare Providers: BankingDealers.co.za  This test is not yet approved or  cleared by the [place] FDA and has been authorized for detection and/or diagnosis of SARS-CoV-2 by FDA under an Emergency Use Authorization (EUA).  This EUA will remain in effect (meaning this test can be used) for the duration of the COVID-19 declaration under Section 564(b)(1) of the Act, 21 U.S.C. section 360bbb-3(b)(1), unless the authorization is terminated or revoked sooner.  Performed at [facility], [address] [place], [place] [address]   Blood culture (routine x 2)     Status: Abnormal   Collection Time: 09/08/19 12:07 PM   Specimen: BLOOD  Result Value Ref Range Status   Specimen Description   Final    BLOOD L DISTAL FOREARM Performed at [facility], [address], [place], [place] [address]    Special Requests   Final    BOTTLES DRAWN AEROBIC AND ANAEROBIC Blood Culture results may not be optimal due to an excessive volume of blood received in culture bottles Performed at [facility], [address], [place], [place] [address]    Culture  Setup Time   Final    GRAM POSITIVE COCCI AEROBIC BOTTLE ONLY CRITICAL RESULT CALLED TO, READ BACK  BY AND VERIFIED WITH: MORGAN CUNNINGHAM  AT 4696 ON 09/09/19 SNG    Culture (A)  Final    STAPHYLOCOCCUS EPIDERMIDIS THE SIGNIFICANCE OF ISOLATING THIS ORGANISM FROM A SINGLE SET OF BLOOD CULTURES WHEN MULTIPLE SETS ARE DRAWN IS UNCERTAIN. PLEASE NOTIFY THE MICROBIOLOGY DEPARTMENT WITHIN ONE WEEK IF SPECIATION AND SENSITIVITIES ARE REQUIRED. Performed at [facility], [address], [place], [place] [address]    Report Status 09/11/2019 FINAL  Final  Blood Culture ID Panel (Reflexed)     Status: Abnormal   Collection Time: 09/08/19 12:07 PM  Result Value Ref Range Status   Enterococcus faecalis NOT DETECTED NOT DETECTED Final   Enterococcus Faecium NOT DETECTED NOT DETECTED Final   Listeria monocytogenes NOT DETECTED NOT DETECTED Final   Staphylococcus species DETECTED (A) NOT DETECTED Final    Comment: CRITICAL RESULT CALLED TO, READ BACK BY AND VERIFIED WITH: MORGAN CUNNINGHAM AT 1345 ON 09/09/19 SNG    Staphylococcus aureus (BCID) NOT DETECTED NOT DETECTED Final   Staphylococcus epidermidis DETECTED (A) NOT DETECTED Final    Comment: CRITICAL RESULT CALLED TO, READ BACK BY AND VERIFIED WITH: MORGAN CUNNINGHAM AT 1345 ON 09/09/19 SNG    Staphylococcus lugdunensis NOT DETECTED NOT DETECTED Final   Streptococcus species NOT DETECTED NOT DETECTED Final   Streptococcus agalactiae NOT DETECTED NOT DETECTED Final   Streptococcus pneumoniae NOT DETECTED NOT DETECTED Final   Streptococcus pyogenes NOT DETECTED NOT DETECTED Final   A.calcoaceticus-baumannii NOT DETECTED NOT DETECTED Final   Bacteroides fragilis NOT DETECTED NOT DETECTED Final   Enterobacterales NOT DETECTED NOT DETECTED Final   Enterobacter cloacae complex NOT DETECTED NOT DETECTED Final   Escherichia coli NOT DETECTED NOT DETECTED Final   Klebsiella aerogenes NOT DETECTED NOT DETECTED Final   Klebsiella oxytoca NOT DETECTED NOT DETECTED Final   Klebsiella pneumoniae NOT DETECTED NOT DETECTED Final   Proteus species  NOT DETECTED NOT DETECTED Final   Salmonella species NOT DETECTED NOT DETECTED Final   Serratia marcescens NOT DETECTED NOT DETECTED Final   Haemophilus influenzae NOT DETECTED NOT DETECTED Final   Neisseria meningitidis NOT DETECTED NOT DETECTED  Final   Pseudomonas aeruginosa NOT DETECTED NOT DETECTED Final   Stenotrophomonas maltophilia NOT DETECTED NOT DETECTED Final   Candida albicans NOT DETECTED NOT DETECTED Final   Candida auris NOT DETECTED NOT DETECTED Final   Candida glabrata NOT DETECTED NOT DETECTED Final   Candida krusei NOT DETECTED NOT DETECTED Final   Candida parapsilosis NOT DETECTED NOT DETECTED Final   Candida tropicalis NOT DETECTED NOT DETECTED Final   Cryptococcus neoformans/gattii NOT DETECTED NOT DETECTED Final   Methicillin resistance mecA/C NOT DETECTED NOT DETECTED Final    Comment: Performed at [facility], [address] [place], [place] [address]  Blood culture (routine x 2)     Status: None   Collection Time: 09/08/19 12:59 PM   Specimen: BLOOD  Result Value Ref Range Status   Specimen Description BLOOD LEFT UPPER FORARM  Final   Special Requests   Final    BOTTLES DRAWN AEROBIC AND ANAEROBIC Blood Culture results may not be optimal due to an excessive volume of blood received in culture bottles   Culture   Final    NO GROWTH 5 DAYS Performed at [facility], [address] [place], [place] [address]    Report Status 09/13/2019 FINAL  Final         Radiology Studies: PERIPHERAL VASCULAR CATHETERIZATION  Result Date: 09/14/2019 See Op Note  DG Chest Port 1 View  Result Date: 09/14/2019 CLINICAL DATA:  PICC line placement. EXAM: PORTABLE CHEST 1 VIEW COMPARISON:  Radiographs earlier the same date and 09/11/2019. FINDINGS: 1416 hours. There is a new left IJ hemodialysis catheter, projecting to the level of the mid right atrium. No visible PICC line. Stable cardiomegaly, aortic atherosclerosis and mild pulmonary edema. There are  probable small bilateral pleural effusions. No pneumothorax. The bones appear unchanged. IMPRESSION: 1. New left IJ hemodialysis catheter tip projects to the level of the mid right atrium. No visible PICC line. 2. Stable cardiomegaly, mild pulmonary edema and probable small bilateral pleural effusions. Electronically Signed   By: Richardean Sale M.D.   On: 09/14/2019 14:37   DG Chest Port 1 View  Result Date: 09/14/2019 CLINICAL DATA:  Acute respiratory failure with hypoxic EXAM: PORTABLE CHEST 1 VIEW COMPARISON:  09/11/2019 FINDINGS: Cardiomegaly and bilateral airspace opacities are again noted. No large pleural effusion or pneumothorax identified. No acute bony abnormalities are present. There has been little interval change since prior study except for removal of a LEFT IJ central venous catheter. IMPRESSION: Little significant change in bilateral airspace opacities and cardiomegaly. Electronically Signed   By: Margarette Canada M.D.   On: 09/14/2019 05:58        Scheduled Meds: . sodium chloride   Intravenous Once  . amoxicillin-clavulanate  1 tablet Oral Q12H  . Chlorhexidine Gluconate Cloth  6 each Topical Daily  . fludrocortisone  0.2 mg Oral BID  . hydrocortisone sod succinate (SOLU-CORTEF) inj  100 mg Intravenous Q12H  . insulin aspart  0-15 Units Subcutaneous Q4H  . ipratropium-albuterol  3 mL Nebulization TID  . levothyroxine  175 mcg Oral Daily  . mouth rinse  15 mL Mouth Rinse BID  . midodrine  10 mg Oral TID WC  . nystatin cream   Topical BID  . pantoprazole (PROTONIX) IV  40 mg Intravenous Q24H  . senna  1 tablet Oral BID  . zinc oxide  1 application Topical Daily   Continuous Infusions:  Assessment & Plan:   Active Problems:   Acute hypoxemic respiratory failure (  [place])   Acute renal failure (HCC)   Hyperkalemia   NSTEMI (non-ST elevated myocardial infarction) ([place])   Pressure injury of skin   Pulmonary hypertension, unspecified ([place])   1.  Acute hypoxic respiratory  failure Transferred from ICU. Covid -8/29 Most likely due to OHS/chronic thoracic restriction with con commitment OSA RVSP elevated, has pulmonary htn found on echo. cxr 8/31 with right-sided infiltrate suggestive of aspiration pneumonia versus CAP-azithromycin and Rocephin.Deescalated to Augmentin.   2.A/CKD stage IIIB Baseline creatinine reported baseline per nephrology's note is 1.5 Nonoliguric urine output Required renal placement therapy. Monitor daily for dialysis needs For now plan on intermittent hemodialysis treatment S/p Insertion of temporary dialysis catheter catheter left IJ approach 9/3   3. afib-rate relatively controlled Cardiology following No plans  to restore to normal sinus rhythm Supposed to be on heparin drip with transition to NOAC-, but heparin was discontinued due to anemia and H&H drop.received s/p 1 U PRBC on 9/3. 4.OSA, obestiy hypoventilation- Needs to use CPAP   Hypotensive -in icu. Was on vasopressors.  bp holding off pressors Continue midodrine.   DVT prophylaxis: scd Code Status:partial Family Communication: none at bedside  Status is: Inpatient  Remains inpatient appropriate because:Inpatient level of care appropriate due to severity of illness   Dispo: The patient is from: Home              Anticipated d/c is to: likely snf              Anticipated d/c date is: > 3 days              Patient currently is not medically stable to d/c.Pt currently undergoing HD, now anemic.             LOS: 7 days   Time spent:     Nolberto Hanlon, MD [facility] Pager [phone]  If 7PM-7AM, please contact night-coverage [email or web address] Password [email or web address] 09/15/2019, 2:15 PM

## 2019-09-15 NOTE — Plan of Care (Signed)
  Problem: Health Behavior/Discharge Planning: Goal: Ability to manage health-related needs will improve Outcome: Progressing   Problem: Clinical Measurements: Goal: Ability to maintain clinical measurements within normal limits will improve Outcome: Progressing Goal: Will remain free from infection Outcome: Progressing Goal: Diagnostic test results will improve Outcome: Progressing Goal: Respiratory complications will improve Outcome: Progressing Goal: Cardiovascular complication will be avoided Outcome: Progressing   Problem: Activity: Goal: Risk for activity intolerance will decrease Outcome: Progressing   Problem: Nutrition: Goal: Adequate nutrition will be maintained Outcome: Progressing   Problem: Elimination: Goal: Will not experience complications related to urinary retention Outcome: Progressing   Problem: Pain Managment: Goal: General experience of comfort will improve Outcome: Progressing   Problem: Skin Integrity: Goal: Risk for impaired skin integrity will decrease Outcome: Progressing

## 2019-09-15 NOTE — Progress Notes (Signed)
[facility]  ROUNDING NOTE   Subjective:   Patient is seen during dialysis Resting quietly Tolerating dialysis fair.  Blood pressure low normal. 2+ edema     Objective:  Vital signs in last 24 hours:  Temp:  [97.4 F (36.3 C)-98 F (36.7 C)] 97.8 F (36.6 C) (09/04 0915) Pulse Rate:  [71-88] 72 (09/04 0835) Resp:  [14-21] 16 (09/04 0835) BP: (97-133)/(54-84) 106/54 (09/04 0835) SpO2:  [93 %-100 %] 97 % (09/04 0835) FiO2 (%):  [40 %] 40 % (09/03 2159)  Weight change:  Filed Weights   09/12/19 0419 09/13/19 0437 09/14/19 1134  Weight: (!) 163.3 kg (!) 163.3 kg (!) 163.3 kg    Intake/Output: I/O last 3 completed shifts: In: -  Out: 501 [Urine:500; Stool:1]   Intake/Output this shift:  No intake/output data recorded.  Physical Exam: General: Lying in bed, in no acute distress, daughter at bedside  Head: Normocephalic, atraumatic. Moist oral mucosal membranes  Lungs:  Diminished bilaterally, dyspnoea on exertion,3L [place] O2  Heart: Regular rate and rhythm  Abdomen:  Soft, nontender, obese  Extremities: ++ peripheral edema.  Neurologic: Nonfocal, moving all four extremities, answering simple questions intermittently.   Skin: No lesions  Access: LIJ temp HD catheter    Basic Metabolic Panel: Recent Labs  Lab 09/09/19 1613 09/09/19 1613 09/10/19 0409 09/10/19 0409 09/11/19 0433 09/11/19 0433 09/12/19 0419 09/13/19 0259 09/14/19 0312  NA 141   < > 139  --  136  --  135 134* 135  K 6.0*   < > 5.2*  --  4.2  --  3.7 4.3 3.9  CL 112*   < > 110  --  105  --  104 104 102  CO2 20*   < > 25  --  25  --  27 26 27   GLUCOSE 115*   < > 136*  --  158*  --  154* 147* 149*  BUN 95*   < > 82*  --  51*  --  42* 45* 74*  CREATININE 4.44*   < > 3.67*  --  2.05*  --  1.69* 1.54* 2.23*  CALCIUM 8.2*   < > 7.9*   < > 8.4*   < > 8.9 8.4* 8.3*  MG 2.2   < > 2.2  --  2.0  --  2.0 2.1 1.9  PHOS 7.6*  --   --   --  3.8  --  2.9 2.3* 2.4*  2.5   < > = values in this  interval not displayed.    Liver Function Tests: Recent Labs  Lab 09/08/19 1205 09/09/19 0329 09/14/19 0312  AST 25 48*  --   ALT 16 19  --   ALKPHOS 89 88  --   BILITOT 1.5* 1.3*  --   PROT 7.7 7.2  --   ALBUMIN 3.0* 2.7* 2.3*   No results for input(s): LIPASE, AMYLASE in the last 168 hours. No results for input(s): AMMONIA in the last 168 hours.  CBC: Recent Labs  Lab 09/08/19 1205 09/09/19 0329 09/10/19 0409 09/11/19 0433 09/12/19 0711 09/13/19 0259 09/14/19 0312  WBC 10.8*   < > 11.5* 11.4* 11.8* 11.9* 11.0*  NEUTROABS 8.9*  --   --   --   --   --   --   HGB 11.2*   < > 10.3* 10.0* 8.8* 8.7* 7.1*  HCT 37.7*   < > 34.1* 31.7* 28.8* 29.5* 22.7*  MCV 104.1*   < >  104.0* 99.4 103.6* 105.0* 99.6  PLT 174   < > 156 137* 129* 140* 122*   < > = values in this interval not displayed.    Cardiac Enzymes: Recent Labs  Lab 09/09/19 0329  CKTOTAL 2,443*    BNP: Invalid input(s): POCBNP  CBG: Recent Labs  Lab 09/14/19 1523 09/14/19 2000 09/14/19 2335 09/15/19 0414 09/15/19 0834  GLUCAP 145* 130* 110* 122* 135*    Microbiology: Results for orders placed or performed during the hospital encounter of 09/08/19  MRSA PCR Screening     Status: None   Collection Time: 09/08/19 11:49 AM   Specimen: Nasopharyngeal  Result Value Ref Range Status   MRSA by PCR NEGATIVE NEGATIVE Final    Comment:        The GeneXpert MRSA Assay (FDA approved for NASAL specimens only), is one component of a comprehensive MRSA colonization surveillance program. It is not intended to diagnose MRSA infection nor to guide or monitor treatment for MRSA infections. Performed at [facility], [address] [place], [place] [address]   SARS Coronavirus 2 by RT PCR (hospital order, performed in [facility] hospital lab) Nasopharyngeal Nasopharyngeal Swab     Status: None   Collection Time: 09/08/19 12:05 PM   Specimen: Nasopharyngeal Swab  Result Value Ref Range Status    SARS Coronavirus 2 NEGATIVE NEGATIVE Final    Comment: (NOTE) SARS-CoV-2 target nucleic acids are NOT DETECTED.  The SARS-CoV-2 RNA is generally detectable in upper and lower respiratory specimens during the acute phase of infection. The lowest concentration of SARS-CoV-2 viral copies this assay can detect is 250 copies / mL. A negative result does not preclude SARS-CoV-2 infection and should not be used as the sole basis for treatment or other patient management decisions.  A negative result may occur with improper specimen collection / handling, submission of specimen other than nasopharyngeal swab, presence of viral mutation(s) within the areas targeted by this assay, and inadequate number of viral copies (<250 copies / mL). A negative result must be combined with clinical observations, patient history, and epidemiological information.  Fact Sheet for Patients:   StrictlyIdeas.no  Fact Sheet for Healthcare Providers: BankingDealers.co.za  This test is not yet approved or  cleared by the [place] FDA and has been authorized for detection and/or diagnosis of SARS-CoV-2 by FDA under an Emergency Use Authorization (EUA).  This EUA will remain in effect (meaning this test can be used) for the duration of the COVID-19 declaration under Section 564(b)(1) of the Act, 21 U.S.C. section 360bbb-3(b)(1), unless the authorization is terminated or revoked sooner.  Performed at [facility], [address] [place], [place] [address]   Blood culture (routine x 2)     Status: Abnormal   Collection Time: 09/08/19 12:07 PM   Specimen: BLOOD  Result Value Ref Range Status   Specimen Description   Final    BLOOD L DISTAL FOREARM Performed at [facility], [address], [place], [place] [address]    Special Requests   Final    BOTTLES DRAWN AEROBIC AND ANAEROBIC Blood Culture results may not be optimal due to an  excessive volume of blood received in culture bottles Performed at [facility], [address] [place], [place] [address]    Culture  Setup Time   Final    GRAM POSITIVE COCCI AEROBIC BOTTLE ONLY CRITICAL RESULT CALLED TO, READ BACK BY AND VERIFIED WITH: MORGAN CUNNINGHAM  AT 1345 ON 09/09/19 SNG  Culture (A)  Final    STAPHYLOCOCCUS EPIDERMIDIS THE SIGNIFICANCE OF ISOLATING THIS ORGANISM FROM A SINGLE SET OF BLOOD CULTURES WHEN MULTIPLE SETS ARE DRAWN IS UNCERTAIN. PLEASE NOTIFY THE MICROBIOLOGY DEPARTMENT WITHIN ONE WEEK IF SPECIATION AND SENSITIVITIES ARE REQUIRED. Performed at [facility], [address], [place], [place] [address]    Report Status 09/11/2019 FINAL  Final  Blood Culture ID Panel (Reflexed)     Status: Abnormal   Collection Time: 09/08/19 12:07 PM  Result Value Ref Range Status   Enterococcus faecalis NOT DETECTED NOT DETECTED Final   Enterococcus Faecium NOT DETECTED NOT DETECTED Final   Listeria monocytogenes NOT DETECTED NOT DETECTED Final   Staphylococcus species DETECTED (A) NOT DETECTED Final    Comment: CRITICAL RESULT CALLED TO, READ BACK BY AND VERIFIED WITH: MORGAN CUNNINGHAM AT 1345 ON 09/09/19 SNG    Staphylococcus aureus (BCID) NOT DETECTED NOT DETECTED Final   Staphylococcus epidermidis DETECTED (A) NOT DETECTED Final    Comment: CRITICAL RESULT CALLED TO, READ BACK BY AND VERIFIED WITH: MORGAN CUNNINGHAM AT 1345 ON 09/09/19 SNG    Staphylococcus lugdunensis NOT DETECTED NOT DETECTED Final   Streptococcus species NOT DETECTED NOT DETECTED Final   Streptococcus agalactiae NOT DETECTED NOT DETECTED Final   Streptococcus pneumoniae NOT DETECTED NOT DETECTED Final   Streptococcus pyogenes NOT DETECTED NOT DETECTED Final   A.calcoaceticus-baumannii NOT DETECTED NOT DETECTED Final   Bacteroides fragilis NOT DETECTED NOT DETECTED Final   Enterobacterales NOT DETECTED NOT DETECTED Final   Enterobacter cloacae complex NOT DETECTED NOT  DETECTED Final   Escherichia coli NOT DETECTED NOT DETECTED Final   Klebsiella aerogenes NOT DETECTED NOT DETECTED Final   Klebsiella oxytoca NOT DETECTED NOT DETECTED Final   Klebsiella pneumoniae NOT DETECTED NOT DETECTED Final   Proteus species NOT DETECTED NOT DETECTED Final   Salmonella species NOT DETECTED NOT DETECTED Final   Serratia marcescens NOT DETECTED NOT DETECTED Final   Haemophilus influenzae NOT DETECTED NOT DETECTED Final   Neisseria meningitidis NOT DETECTED NOT DETECTED Final   Pseudomonas aeruginosa NOT DETECTED NOT DETECTED Final   Stenotrophomonas maltophilia NOT DETECTED NOT DETECTED Final   Candida albicans NOT DETECTED NOT DETECTED Final   Candida auris NOT DETECTED NOT DETECTED Final   Candida glabrata NOT DETECTED NOT DETECTED Final   Candida krusei NOT DETECTED NOT DETECTED Final   Candida parapsilosis NOT DETECTED NOT DETECTED Final   Candida tropicalis NOT DETECTED NOT DETECTED Final   Cryptococcus neoformans/gattii NOT DETECTED NOT DETECTED Final   Methicillin resistance mecA/C NOT DETECTED NOT DETECTED Final    Comment: Performed at [facility], [address] [place], [place] [address]  Blood culture (routine x 2)     Status: None   Collection Time: 09/08/19 12:59 PM   Specimen: BLOOD  Result Value Ref Range Status   Specimen Description BLOOD LEFT UPPER FORARM  Final   Special Requests   Final    BOTTLES DRAWN AEROBIC AND ANAEROBIC Blood Culture results may not be optimal due to an excessive volume of blood received in culture bottles   Culture   Final    NO GROWTH 5 DAYS Performed at [facility], [address] [place],  [address]    Report Status 09/13/2019 FINAL  Final    Coagulation Studies: No results for input(s): LABPROT, INR in the last 72 hours.  Urinalysis: No results for input(s): COLORURINE, LABSPEC, PHURINE, GLUCOSEU, HGBUR, BILIRUBINUR, KETONESUR, PROTEINUR, UROBILINOGEN, NITRITE, LEUKOCYTESUR in  the  last 72 hours.  Invalid input(s): APPERANCEUR    Imaging: PERIPHERAL VASCULAR CATHETERIZATION  Result Date: 09/14/2019 See Op Note  DG Chest Port 1 View  Result Date: 09/14/2019 CLINICAL DATA:  PICC line placement. EXAM: PORTABLE CHEST 1 VIEW COMPARISON:  Radiographs earlier the same date and 09/11/2019. FINDINGS: 1416 hours. There is a new left IJ hemodialysis catheter, projecting to the level of the mid right atrium. No visible PICC line. Stable cardiomegaly, aortic atherosclerosis and mild pulmonary edema. There are probable small bilateral pleural effusions. No pneumothorax. The bones appear unchanged. IMPRESSION: 1. New left IJ hemodialysis catheter tip projects to the level of the mid right atrium. No visible PICC line. 2. Stable cardiomegaly, mild pulmonary edema and probable small bilateral pleural effusions. Electronically Signed   By: Richardean Sale M.D.   On: 09/14/2019 14:37   DG Chest Port 1 View  Result Date: 09/14/2019 CLINICAL DATA:  Acute respiratory failure with hypoxic EXAM: PORTABLE CHEST 1 VIEW COMPARISON:  09/11/2019 FINDINGS: Cardiomegaly and bilateral airspace opacities are again noted. No large pleural effusion or pneumothorax identified. No acute bony abnormalities are present. There has been little interval change since prior study except for removal of a LEFT IJ central venous catheter. IMPRESSION: Little significant change in bilateral airspace opacities and cardiomegaly. Electronically Signed   By: Margarette Canada M.D.   On: 09/14/2019 05:58     Medications:    . sodium chloride   Intravenous Once  . amoxicillin-clavulanate  1 tablet Oral Q12H  . Chlorhexidine Gluconate Cloth  6 each Topical Daily  . fludrocortisone  0.2 mg Oral BID  . hydrocortisone sod succinate (SOLU-CORTEF) inj  100 mg Intravenous Q12H  . insulin aspart  0-15 Units Subcutaneous Q4H  . ipratropium-albuterol  3 mL Nebulization TID  . levothyroxine  175 mcg Oral Daily  . mouth rinse  15 mL  Mouth Rinse BID  . midodrine  10 mg Oral TID WC  . nystatin cream   Topical BID  . pantoprazole (PROTONIX) IV  40 mg Intravenous Q24H  . senna  1 tablet Oral BID  . zinc oxide  1 application Topical Daily   acetaminophen **OR** acetaminophen, heparin sodium (porcine), HYDROmorphone (DILAUDID) injection, ondansetron **OR** ondansetron (ZOFRAN) IV, ondansetron (ZOFRAN) IV, polyethylene glycol  Assessment/ Plan:  Andrew Valentine is a 79 y.o. 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, hypoventilation syndrome, hyperlipidemia who is admitted to [facility] on 09/08/2019 for Acidosis [E87.2] Hyperkalemia [E87.5] ATN (acute tubular necrosis) (HCC) [N17.0] Acute renal failure with tubular necrosis (HCC) [N17.0] Acute renal failure (HCC) [N17.9] Hypoxia [R09.02] NSTEMI (non-ST elevated myocardial infarction) ([place]) [I21.4] AKI (acute kidney injury) ([place]) [N17.9] Acute hypoxemic respiratory failure (HCC) [J96.01] Hypotension due to hypovolemia [I95.89, E86.1]  1. Acute renal failure with hyperkalemia: on chronic kidney disease stage IIIB.  Creatinine baseline reported to be 1.5,  by son.  Nonoliguric urine output.  - required renal replacement therapy. CVVHDF from 8/29 to 9/2 - Monitor daily for dialysis need Continue intermittent hemodialysis treatment.  Plan to place tunneled dialysis catheter, when he is able to lay flat  2. Hypotension: Required vasopressors during hospital stay- norepinephrine. With cardiogenic and septic shock from pneumonia - Appreciate cardiology input. Continue midodrine  3. Acute respiratory failure with respiratory acidosis:  Acute exacerbation of chronic diastolic congestive heart failure  Appreciate pulmonary input.  On empiric antibiotics: Augmentin and azithromycin      LOS: 7 Labarron Durnin 9/4/202111:57 AM

## 2019-09-15 NOTE — Progress Notes (Signed)
Progress Note  Patient Name: Andrew Valentine Date of Encounter: 09/15/2019  Primary Cardiologist: Ida Rogue, MD  Subjective   Currently in HD.  No c/p or dyspnea and tolerating HD well.  Inpatient Medications    Scheduled Meds:  sodium chloride   Intravenous Once   amoxicillin-clavulanate  1 tablet Oral Q12H   Chlorhexidine Gluconate Cloth  6 each Topical Daily   fludrocortisone  0.2 mg Oral BID   hydrocortisone sod succinate (SOLU-CORTEF) inj  100 mg Intravenous Q12H   insulin aspart  0-15 Units Subcutaneous Q4H   ipratropium-albuterol  3 mL Nebulization TID   levothyroxine  175 mcg Oral Daily   mouth rinse  15 mL Mouth Rinse BID   midodrine  10 mg Oral TID WC   nystatin cream   Topical BID   pantoprazole (PROTONIX) IV  40 mg Intravenous Q24H   senna  1 tablet Oral BID   zinc oxide  1 application Topical Daily   Continuous Infusions:  PRN Meds: acetaminophen **OR** acetaminophen, heparin sodium (porcine), HYDROmorphone (DILAUDID) injection, ondansetron **OR** ondansetron (ZOFRAN) IV, ondansetron (ZOFRAN) IV, polyethylene glycol   Vital Signs    Vitals:   09/14/19 2159 09/14/19 2332 09/15/19 0317 09/15/19 0835  BP:  121/60 118/72 (!) 106/54  Pulse:  71 77 72  Resp:  16 14 16   Temp:  98 F (36.7 C) 97.8 F (36.6 C) (!) 97.4 F (36.3 C)  TempSrc:  Oral Oral Oral  SpO2: 95% 97% 93% 97%  Weight:      Height:        Intake/Output Summary (Last 24 hours) at 09/15/2019 0927 Last data filed at 09/15/2019 0319 Gross per 24 hour  Intake --  Output 501 ml  Net -501 ml   Filed Weights   09/12/19 0419 09/13/19 0437 09/14/19 1134  Weight: (!) 163.3 kg (!) 163.3 kg (!) 163.3 kg    Physical Exam   GEN: Obese, in no acute distress.  HEENT: Grossly normal.  Neck: Supple, obese, unable to gauge JVP.  No carotid bruits, or masses. Cardiac: IR, IR, no murmurs, rubs, or gallops. No clubbing.  2+ bilat LE, no cyanosis, edema.  Radials/DP/PT 1+ and equal  bilaterally.  Respiratory:  Respirations regular and unlabored, diminished breath sounds bilat.  GI: Obese, soft, nontender, nondistended, BS + x 4. MS: no deformity or atrophy. Skin: warm and dry, no rash. Neuro:  Strength and sensation are intact. Psych: AAOx3.  Normal affect.  Labs    Chemistry Recent Labs  Lab 09/08/19 1205 09/08/19 1205 09/09/19 0329 09/09/19 1004 09/12/19 0419 09/13/19 0259 09/14/19 0312  NA 140   < > 139   < > 135 134* 135  K 5.3*   < > 5.3*   < > 3.7 4.3 3.9  CL 108   < > 109   < > 104 104 102  CO2 24   < > 22   < > 27 26 27   GLUCOSE 88   < > 86   < > 154* 147* 149*  BUN 94*   < > 94*   < > 42* 45* 74*  CREATININE 4.47*   < > 4.49*   < > 1.69* 1.54* 2.23*  CALCIUM 8.0*   < > 7.7*   < > 8.9 8.4* 8.3*  PROT 7.7  --  7.2  --   --   --   --   ALBUMIN 3.0*  --  2.7*  --   --   --  2.3*  AST 25  --  48*  --   --   --   --   ALT 16  --  19  --   --   --   --   ALKPHOS 89  --  88  --   --   --   --   BILITOT 1.5*  --  1.3*  --   --   --   --   GFRNONAA 12*   < > 12*   < > 38* 42* 27*  GFRAA 14*   < > 13*   < > 44* 49* 31*  ANIONGAP 8   < > 8   < > 4* 4* 6   < > = values in this interval not displayed.     Hematology Recent Labs  Lab 09/12/19 0711 09/13/19 0259 09/14/19 0312  WBC 11.8* 11.9* 11.0*  RBC 2.78* 2.81* 2.28*  HGB 8.8* 8.7* 7.1*  HCT 28.8* 29.5* 22.7*  MCV 103.6* 105.0* 99.6  MCH 31.7 31.0 31.1  MCHC 30.6 29.5* 31.3  RDW 17.2* 17.2* 17.2*  PLT 129* 140* 122*    Cardiac Enzymes  Recent Labs  Lab 09/08/19 1205 09/08/19 1607 09/09/19 0607 09/09/19 1004  TROPONINIHS 181* 445* 1,541* 2,333*      BNP Recent Labs  Lab 09/08/19 1205  BNP 280.3*    HbA1c  Lab Results  Component Value Date   HGBA1C 6.6 (H) 09/08/2019    Radiology    PERIPHERAL VASCULAR CATHETERIZATION  Result Date: 09/14/2019 See Op Note  DG Chest Port 1 View  Result Date: 09/14/2019 CLINICAL DATA:  PICC line placement. EXAM: PORTABLE CHEST 1 VIEW  COMPARISON:  Radiographs earlier the same date and 09/11/2019. FINDINGS: 1416 hours. There is a new left IJ hemodialysis catheter, projecting to the level of the mid right atrium. No visible PICC line. Stable cardiomegaly, aortic atherosclerosis and mild pulmonary edema. There are probable small bilateral pleural effusions. No pneumothorax. The bones appear unchanged. IMPRESSION: 1. New left IJ hemodialysis catheter tip projects to the level of the mid right atrium. No visible PICC line. 2. Stable cardiomegaly, mild pulmonary edema and probable small bilateral pleural effusions. Electronically Signed   By: Richardean Sale M.D.   On: 09/14/2019 14:37   DG Chest Port 1 View  Result Date: 09/14/2019 CLINICAL DATA:  Acute respiratory failure with hypoxic EXAM: PORTABLE CHEST 1 VIEW COMPARISON:  09/11/2019 FINDINGS: Cardiomegaly and bilateral airspace opacities are again noted. No large pleural effusion or pneumothorax identified. No acute bony abnormalities are present. There has been little interval change since prior study except for removal of a LEFT IJ central venous catheter. IMPRESSION: Little significant change in bilateral airspace opacities and cardiomegaly. Electronically Signed   By: Margarette Canada M.D.   On: 09/14/2019 05:58   DG Chest Port 1 View  Result Date: 09/11/2019 CLINICAL DATA:  Shortness of breath. EXAM: PORTABLE CHEST 1 VIEW COMPARISON:  Chest radiograph dated 09/09/2019 FINDINGS: The heart is enlarged. A left internal jugular central venous catheter tip overlies the superior vena cava. Moderate to severe bilateral interstitial and airspace opacities appear slightly increased since prior exam. A left pleural effusion likely contributes. There is no definite right pleural effusion. There is no pneumothorax. IMPRESSION: Moderate to severe bilateral interstitial and airspace opacities appear slightly increased since prior exam. A left pleural effusion likely contributes. Electronically Signed    By: Zerita Boers M.D.   On: 09/11/2019 14:22    Telemetry  Seen in HD  Cardiac Studies   2D Echocardiogram 8.29.2021  Echo  1. Left ventricular ejection fraction, by estimation, is 55 to 60%. The  left ventricle has normal function. The left ventricle has no regional  wall motion abnormalities. Left ventricular diastolic parameters are  indeterminate. Septal flattening in  systole consistent with elevated right heart pressures.   2. Right ventricular systolic function was not well visualized. The right  ventricular size is moderately enlarged. There is severely elevated  pulmonary artery systolic pressure. The estimated right ventricular  systolic pressure is 40.9 mmHg.   3. Left atrial size was mildly dilated.  _____________   Patient Profile     Janard Culp is a 79 y.o. male with a hx of morbid obesity, obstructive sleep apnea, obesity hypoventilation syndrome, noncompliant with CPAP , diabetes , presenting with hyperglycemia, hypoxia, hypotension, noted to be in atrial fibrillation in the emergency room.  Assessment & Plan    1.  Persistent Atrial Fibrillation:  Rate-controlled in absence of AVN blocking agent.  Asymptomatic.  Heparin off in setting of persistent anemia.  CHA2DS2VASc = 5.  Will have to follow h/h and decide risk/benefit of oral anticoagulation.  2.  Acute hypercarbic respiratory failure/PNA (aspiration vs CAP)/Acute HFpEF: 3-4 day h/o progressive dyspnea at home prior to ED eval on 8/28, Hypoxic, hypoglycemic, hypotensive on EMS arrival.  Acute renal failure noted in ED w/ creat of 4.47.  CXR w/ bilat infiltrates favoring PNA.  Initially req BiPAP.  Tx to floor 9/2.  Nl EF w/ PAH (RVSP 72.1 on echo). Breathing stable on nasal cannula.  Diminished breath sounds.  Abx, steroids and nebs per IM/pulm. Volume mgmt per nephrology/HD.  3.  PAH:  In setting of poorly controlled OSA, morbid obesity, OHS.  RVSP 72.58mmHg on echo 8.29.    4.  Anemia:  S/p transfusion.   Labs pending this AM.  5.  Acute renal failure on CKD IIIb:  Creat 4.47 on admission s/p CVVHDF 8/29  9/2 and HD access placement. HD today.  Per nephrology.  6.  OSA/OHS:  CPAP ordered.  7.  Essential HTN/Hypotension:  BPs stable.  No antihypertensives in setting of hypotension/orthostasis  cont florinef/midodrine.  Signed, Murray Hodgkins, NP  09/15/2019, 9:27 AM    For questions or updates, please contact   Please consult [email or web address] for contact info under Cardiology/STEMI.

## 2019-09-15 NOTE — Progress Notes (Signed)
Pts hgb 6.9. MD notified.

## 2019-09-16 DIAGNOSIS — D62 Acute posthemorrhagic anemia: Secondary | ICD-10-CM

## 2019-09-16 LAB — TYPE AND SCREEN
ABO/RH(D): O POS
Antibody Screen: NEGATIVE
Unit division: 0
Unit division: 0

## 2019-09-16 LAB — PHOSPHORUS: Phosphorus: 3.6 mg/dL (ref 2.5–4.6)

## 2019-09-16 LAB — GLUCOSE, CAPILLARY
Glucose-Capillary: 138 mg/dL — ABNORMAL HIGH (ref 70–99)
Glucose-Capillary: 142 mg/dL — ABNORMAL HIGH (ref 70–99)
Glucose-Capillary: 147 mg/dL — ABNORMAL HIGH (ref 70–99)
Glucose-Capillary: 149 mg/dL — ABNORMAL HIGH (ref 70–99)
Glucose-Capillary: 154 mg/dL — ABNORMAL HIGH (ref 70–99)
Glucose-Capillary: 163 mg/dL — ABNORMAL HIGH (ref 70–99)

## 2019-09-16 LAB — CBC
HCT: 23.4 % — ABNORMAL LOW (ref 39.0–52.0)
Hemoglobin: 7.5 g/dL — ABNORMAL LOW (ref 13.0–17.0)
MCH: 31.5 pg (ref 26.0–34.0)
MCHC: 32.1 g/dL (ref 30.0–36.0)
MCV: 98.3 fL (ref 80.0–100.0)
Platelets: 129 10*3/uL — ABNORMAL LOW (ref 150–400)
RBC: 2.38 MIL/uL — ABNORMAL LOW (ref 4.22–5.81)
RDW: 18.2 % — ABNORMAL HIGH (ref 11.5–15.5)
WBC: 14.3 10*3/uL — ABNORMAL HIGH (ref 4.0–10.5)
nRBC: 1.2 % — ABNORMAL HIGH (ref 0.0–0.2)

## 2019-09-16 LAB — BASIC METABOLIC PANEL
Anion gap: 7 (ref 5–15)
BUN: 77 mg/dL — ABNORMAL HIGH (ref 8–23)
CO2: 28 mmol/L (ref 22–32)
Calcium: 8.1 mg/dL — ABNORMAL LOW (ref 8.9–10.3)
Chloride: 102 mmol/L (ref 98–111)
Creatinine, Ser: 2.44 mg/dL — ABNORMAL HIGH (ref 0.61–1.24)
GFR calc Af Amer: 28 mL/min — ABNORMAL LOW (ref >60)
GFR calc non Af Amer: 24 mL/min — ABNORMAL LOW (ref >60)
Glucose, Bld: 180 mg/dL — ABNORMAL HIGH (ref 70–99)
Potassium: 3.7 mmol/L (ref 3.5–5.1)
Sodium: 137 mmol/L (ref 135–145)

## 2019-09-16 LAB — BPAM RBC
Blood Product Expiration Date: 202109292359
Blood Product Expiration Date: 202110032359
ISSUE DATE / TIME: 202109031639
ISSUE DATE / TIME: 202109042200
Unit Type and Rh: 5100
Unit Type and Rh: 5100

## 2019-09-16 LAB — MAGNESIUM: Magnesium: 1.8 mg/dL (ref 1.7–2.4)

## 2019-09-16 MED ORDER — ALBUMIN HUMAN 25 % IV SOLN
12.5000 g | Freq: Every day | INTRAVENOUS | Status: DC
  Administered 2019-09-16 – 2019-09-24 (×9): 12.5 g via INTRAVENOUS
  Filled 2019-09-16 (×9): qty 50

## 2019-09-16 NOTE — Progress Notes (Signed)
[facility]  ROUNDING NOTE   Subjective:   Sitting up in the bed No acute distress States he ate a small breakfast.  No nausea or vomiting reported No shortness of breath Requiring oxygen supplementation Continues to have some lower extremity edema   Objective:  Vital signs in last 24 hours:  Temp:  [97.5 F (36.4 C)-98.5 F (36.9 C)] 97.9 F (36.6 C) (09/05 0733) Pulse Rate:  [61-83] 61 (09/05 0733) Resp:  [15-18] 18 (09/05 0733) BP: (104-120)/(50-63) 114/54 (09/05 0733) SpO2:  [93 %-100 %] 100 % (09/05 0733)  Weight change:  Filed Weights   09/12/19 0419 09/13/19 0437 09/14/19 1134  Weight: (!) 163.3 kg (!) 163.3 kg (!) 163.3 kg    Intake/Output: I/O last 3 completed shifts: In: 62 [P.O.:120; I.V.:100; Blood:420] Out: 1150 [Urine:1150]   Intake/Output this shift:  Total I/O In: 120 [P.O.:120] Out: 200 [Urine:200]  Physical Exam: General: Lying in bed, in no acute distress, morbidly obese  Head: Normocephalic, atraumatic. Moist oral mucosal membranes  Lungs:  Diminished bilaterally, [place] O2  Heart:  No rub  Abdomen:  Soft, nontender, obese  Extremities: + peripheral edema.  Neurologic:  Alert, able to answer questions appropriately  Skin: No lesions  Access: LIJ temp HD catheter    Basic Metabolic Panel: Recent Labs  Lab 09/12/19 0419 09/12/19 0419 09/13/19 0259 09/13/19 0259 09/14/19 0312 09/15/19 1414 09/16/19 1106  NA 135  --  134*  --  135 136 137  K 3.7  --  4.3  --  3.9 3.7 3.7  CL 104  --  104  --  102 102 102  CO2 27  --  26  --  27 30 28   GLUCOSE 154*  --  147*  --  149* 122* 180*  BUN 42*  --  45*  --  74* 60* 77*  CREATININE 1.69*  --  1.54*  --  2.23* 2.02* 2.44*  CALCIUM 8.9   < > 8.4*   < > 8.3* 7.7* 8.1*  MG 2.0  --  2.1  --  1.9 1.8 1.8  PHOS 2.9  --  2.3*  --  2.4*   2.5 3.5 3.6   < > = values in this interval not displayed.    Liver Function Tests: Recent Labs  Lab 09/14/19 0312  ALBUMIN 2.3*   No results  for input(s): LIPASE, AMYLASE in the last 168 hours. No results for input(s): AMMONIA in the last 168 hours.  CBC: Recent Labs  Lab 09/12/19 0711 09/13/19 0259 09/14/19 0312 09/15/19 1507 09/16/19 1106  WBC 11.8* 11.9* 11.0* 13.2* 14.3*  HGB 8.8* 8.7* 7.1* 6.9* 7.5*  HCT 28.8* 29.5* 22.7* 20.7* 23.4*  MCV 103.6* 105.0* 99.6 95.0 98.3  PLT 129* 140* 122* 112* 129*    Cardiac Enzymes: No results for input(s): CKTOTAL, CKMB, CKMBINDEX, TROPONINI in the last 168 hours.  BNP: Invalid input(s): POCBNP  CBG: Recent Labs  Lab 09/15/19 2021 09/16/19 0000 09/16/19 0400 09/16/19 0735 09/16/19 1135  GLUCAP 127* 154* 142* 138* 163*    Microbiology: Results for orders placed or performed during the hospital encounter of 09/08/19  MRSA PCR Screening     Status: None   Collection Time: 09/08/19 11:49 AM   Specimen: Nasopharyngeal  Result Value Ref Range Status   MRSA by PCR NEGATIVE NEGATIVE Final    Comment:        The GeneXpert MRSA Assay (FDA approved for NASAL specimens only), is one component of a  comprehensive MRSA colonization surveillance program. It is not intended to diagnose MRSA infection nor to guide or monitor treatment for MRSA infections. Performed at [facility], [address] [place], [place] [address]   SARS Coronavirus 2 by RT PCR (hospital order, performed in [facility] hospital lab) Nasopharyngeal Nasopharyngeal Swab     Status: None   Collection Time: 09/08/19 12:05 PM   Specimen: Nasopharyngeal Swab  Result Value Ref Range Status   SARS Coronavirus 2 NEGATIVE NEGATIVE Final    Comment: (NOTE) SARS-CoV-2 target nucleic acids are NOT DETECTED.  The SARS-CoV-2 RNA is generally detectable in upper and lower respiratory specimens during the acute phase of infection. The lowest concentration of SARS-CoV-2 viral copies this assay can detect is 250 copies / mL. A negative result does not preclude SARS-CoV-2 infection and should not be  used as the sole basis for treatment or other patient management decisions.  A negative result may occur with improper specimen collection / handling, submission of specimen other than nasopharyngeal swab, presence of viral mutation(s) within the areas targeted by this assay, and inadequate number of viral copies (<250 copies / mL). A negative result must be combined with clinical observations, patient history, and epidemiological information.  Fact Sheet for Patients:   StrictlyIdeas.no  Fact Sheet for Healthcare Providers: BankingDealers.co.za  This test is not yet approved or  cleared by the [place] FDA and has been authorized for detection and/or diagnosis of SARS-CoV-2 by FDA under an Emergency Use Authorization (EUA).  This EUA will remain in effect (meaning this test can be used) for the duration of the COVID-19 declaration under Section 564(b)(1) of the Act, 21 U.S.C. section 360bbb-3(b)(1), unless the authorization is terminated or revoked sooner.  Performed at [facility], [address] [place], [place] [address]   Blood culture (routine x 2)     Status: Abnormal   Collection Time: 09/08/19 12:07 PM   Specimen: BLOOD  Result Value Ref Range Status   Specimen Description   Final    BLOOD L DISTAL FOREARM Performed at [facility], [address] [place], [place] [address]    Special Requests   Final    BOTTLES DRAWN AEROBIC AND ANAEROBIC Blood Culture results may not be optimal due to an excessive volume of blood received in culture bottles Performed at [facility], [address], [place], [place] [address]    Culture  Setup Time   Final    GRAM POSITIVE COCCI AEROBIC BOTTLE ONLY CRITICAL RESULT CALLED TO, READ BACK BY AND VERIFIED WITH: MORGAN CUNNINGHAM  AT 2585 ON 09/09/19 SNG    Culture (A)  Final    STAPHYLOCOCCUS EPIDERMIDIS THE SIGNIFICANCE OF ISOLATING THIS ORGANISM FROM  A SINGLE SET OF BLOOD CULTURES WHEN MULTIPLE SETS ARE DRAWN IS UNCERTAIN. PLEASE NOTIFY THE MICROBIOLOGY DEPARTMENT WITHIN ONE WEEK IF SPECIATION AND SENSITIVITIES ARE REQUIRED. Performed at [facility], [place] [address], [place],  [address]    Report Status 09/11/2019 FINAL  Final  Blood Culture ID Panel (Reflexed)     Status: Abnormal   Collection Time: 09/08/19 12:07 PM  Result Value Ref Range Status   Enterococcus faecalis NOT DETECTED NOT DETECTED Final   Enterococcus Faecium NOT DETECTED NOT DETECTED Final   Listeria monocytogenes NOT DETECTED NOT DETECTED Final   Staphylococcus species DETECTED (A) NOT DETECTED Final    Comment: CRITICAL RESULT CALLED TO, READ BACK BY AND VERIFIED WITH: MORGAN CUNNINGHAM AT 1345 ON 09/09/19 SNG  Staphylococcus aureus (BCID) NOT DETECTED NOT DETECTED Final   Staphylococcus epidermidis DETECTED (A) NOT DETECTED Final    Comment: CRITICAL RESULT CALLED TO, READ BACK BY AND VERIFIED WITH: MORGAN CUNNINGHAM AT 1345 ON 09/09/19 SNG    Staphylococcus lugdunensis NOT DETECTED NOT DETECTED Final   Streptococcus species NOT DETECTED NOT DETECTED Final   Streptococcus agalactiae NOT DETECTED NOT DETECTED Final   Streptococcus pneumoniae NOT DETECTED NOT DETECTED Final   Streptococcus pyogenes NOT DETECTED NOT DETECTED Final   A.calcoaceticus-baumannii NOT DETECTED NOT DETECTED Final   Bacteroides fragilis NOT DETECTED NOT DETECTED Final   Enterobacterales NOT DETECTED NOT DETECTED Final   Enterobacter cloacae complex NOT DETECTED NOT DETECTED Final   Escherichia coli NOT DETECTED NOT DETECTED Final   Klebsiella aerogenes NOT DETECTED NOT DETECTED Final   Klebsiella oxytoca NOT DETECTED NOT DETECTED Final   Klebsiella pneumoniae NOT DETECTED NOT DETECTED Final   Proteus species NOT DETECTED NOT DETECTED Final   Salmonella species NOT DETECTED NOT DETECTED Final   Serratia marcescens NOT DETECTED NOT DETECTED Final   Haemophilus influenzae  NOT DETECTED NOT DETECTED Final   Neisseria meningitidis NOT DETECTED NOT DETECTED Final   Pseudomonas aeruginosa NOT DETECTED NOT DETECTED Final   Stenotrophomonas maltophilia NOT DETECTED NOT DETECTED Final   Candida albicans NOT DETECTED NOT DETECTED Final   Candida auris NOT DETECTED NOT DETECTED Final   Candida glabrata NOT DETECTED NOT DETECTED Final   Candida krusei NOT DETECTED NOT DETECTED Final   Candida parapsilosis NOT DETECTED NOT DETECTED Final   Candida tropicalis NOT DETECTED NOT DETECTED Final   Cryptococcus neoformans/gattii NOT DETECTED NOT DETECTED Final   Methicillin resistance mecA/C NOT DETECTED NOT DETECTED Final    Comment: Performed at [facility], [address] [place], [place] [address]  Blood culture (routine x 2)     Status: None   Collection Time: 09/08/19 12:59 PM   Specimen: BLOOD  Result Value Ref Range Status   Specimen Description BLOOD LEFT UPPER FORARM  Final   Special Requests   Final    BOTTLES DRAWN AEROBIC AND ANAEROBIC Blood Culture results may not be optimal due to an excessive volume of blood received in culture bottles   Culture   Final    NO GROWTH 5 DAYS Performed at [facility], [address] [place], [place] [address]    Report Status 09/13/2019 FINAL  Final    Coagulation Studies: No results for input(s): LABPROT, INR in the last 72 hours.  Urinalysis: No results for input(s): COLORURINE, LABSPEC, PHURINE, GLUCOSEU, HGBUR, BILIRUBINUR, KETONESUR, PROTEINUR, UROBILINOGEN, NITRITE, LEUKOCYTESUR in the last 72 hours.  Invalid input(s): APPERANCEUR    Imaging: PERIPHERAL VASCULAR CATHETERIZATION  Result Date: 09/14/2019 See Op Note  DG Chest Port 1 View  Result Date: 09/14/2019 CLINICAL DATA:  PICC line placement. EXAM: PORTABLE CHEST 1 VIEW COMPARISON:  Radiographs earlier the same date and 09/11/2019. FINDINGS: 1416 hours. There is a new left IJ hemodialysis catheter, projecting to the level of the mid  right atrium. No visible PICC line. Stable cardiomegaly, aortic atherosclerosis and mild pulmonary edema. There are probable small bilateral pleural effusions. No pneumothorax. The bones appear unchanged. IMPRESSION: 1. New left IJ hemodialysis catheter tip projects to the level of the mid right atrium. No visible PICC line. 2. Stable cardiomegaly, mild pulmonary edema and probable small bilateral pleural effusions. Electronically Signed   By: Richardean Sale M.D.   On: 09/14/2019 14:37     Medications:  sodium chloride   Intravenous Once   amoxicillin-clavulanate  1 tablet Oral Q12H   Chlorhexidine Gluconate Cloth  6 each Topical Daily   fludrocortisone  0.2 mg Oral BID   hydrocortisone sod succinate (SOLU-CORTEF) inj  100 mg Intravenous Q12H   insulin aspart  0-15 Units Subcutaneous Q4H   ipratropium-albuterol  3 mL Nebulization TID   levothyroxine  175 mcg Oral Daily   mouth rinse  15 mL Mouth Rinse BID   midodrine  10 mg Oral TID WC   nystatin cream   Topical BID   pantoprazole (PROTONIX) IV  40 mg Intravenous Q24H   senna  1 tablet Oral BID   zinc oxide  1 application Topical Daily   acetaminophen **OR** acetaminophen, heparin sodium (porcine), HYDROmorphone (DILAUDID) injection, ondansetron **OR** ondansetron (ZOFRAN) IV, ondansetron (ZOFRAN) IV, polyethylene glycol  Assessment/ Plan:  Mr. Andrew Valentine is a 79 y.o. 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 Acute renal failure with hyperkalemia, LE edema, chronic kidney disease stage IIIB.  Creatinine baseline reported to be 1.5,  by son.  Nonoliguric urine output. 09/04 0701 - 09/05 0700 In: 640 [P.O.:120; I.V.:100; Blood:420] Out: 14 [Urine:650]   - required renal replacement therapy. CVVHDF from 8/29 to 9/2 - Monitor daily for dialysis need in for renal recovery -Electrolytes and volume status are acceptable.  No acute indication for dialysis at present -We will add IV albumin for oncotic support  2. Hypotension: Required vasopressors during hospital stay- norepinephrine, cardiogenic and septic shock from pneumonia Continue midodrine Also getting fludrocortisone 0.2 mg twice a day  3.  Pneumonia Improved Patient has underlying pulmonary hypertension, obstructive sleep apnea and morbid obesity Currently on Augmentin      LOS: 8 Andrew Valentine 9/5/202112:12 PM

## 2019-09-16 NOTE — Progress Notes (Signed)
Progress Note  Patient Name: Andrew Valentine Date of Encounter: 09/16/2019  [facility] Cardiologist: Ida Rogue, MD   Subjective   Patient seen this morning, laying in bed.  States feeling okay, lab draw apparently was unsuccessful.  Inpatient Medications    Scheduled Meds: . sodium chloride   Intravenous Once  . amoxicillin-clavulanate  1 tablet Oral Q12H  . Chlorhexidine Gluconate Cloth  6 each Topical Daily  . fludrocortisone  0.2 mg Oral BID  . hydrocortisone sod succinate (SOLU-CORTEF) inj  100 mg Intravenous Q12H  . insulin aspart  0-15 Units Subcutaneous Q4H  . ipratropium-albuterol  3 mL Nebulization TID  . levothyroxine  175 mcg Oral Daily  . mouth rinse  15 mL Mouth Rinse BID  . midodrine  10 mg Oral TID WC  . nystatin cream   Topical BID  . pantoprazole (PROTONIX) IV  40 mg Intravenous Q24H  . senna  1 tablet Oral BID  . zinc oxide  1 application Topical Daily   Continuous Infusions:  PRN Meds: acetaminophen **OR** acetaminophen, heparin sodium (porcine), HYDROmorphone (DILAUDID) injection, ondansetron **OR** ondansetron (ZOFRAN) IV, ondansetron (ZOFRAN) IV, polyethylene glycol   Vital Signs    Vitals:   09/15/19 2153 09/15/19 2229 09/16/19 0153 09/16/19 0733  BP: 116/63 120/60 (!) 109/50 (!) 114/54  Pulse: 83 82 74 61  Resp: 16 16 16 18   Temp: 98.4 F (36.9 C) 98.2 F (36.8 C) 98.5 F (36.9 C) 97.9 F (36.6 C)  TempSrc: Oral Oral Oral   SpO2: 94% 95% 95% 100%  Weight:      Height:        Intake/Output Summary (Last 24 hours) at 09/16/2019 1111 Last data filed at 09/16/2019 1013 Gross per 24 hour  Intake 760 ml  Output 650 ml  Net 110 ml   Last 3 Weights 09/14/2019 09/13/2019 09/12/2019  Weight (lbs) 360 lb 0.2 oz 360 lb 0.2 oz 360 lb 0.2 oz  Weight (kg) 163.3 kg 163.3 kg 163.3 kg      Telemetry    Atrial fibrillation, heart rate 82- Personally Reviewed  ECG    New tracing- Personally Reviewed  Physical Exam   GEN: Well nourished, looks  tired, obese, soft-spoken HEENT: Normal NECK: No JVD; No carotid bruits CARDIAC:  irregularly irregular, distant heart sounds RESPIRATORY: Poor inspiratory effort, decreased breath sounds at bases ABDOMEN: Soft, non-tender, non-distended MUSCULOSKELETAL: Trace edema; No deformity  SKIN: Warm and dry NEUROLOGIC: Alert and oriented x 3 PSYCHIATRIC: Normal affect   Labs    High Sensitivity Troponin:   Recent Labs  Lab 09/08/19 1205 09/08/19 1607 09/09/19 0607 09/09/19 1004  TROPONINIHS 181* 445* 1,541* 2,333*      Chemistry Recent Labs  Lab 09/13/19 0259 09/14/19 0312 09/15/19 1414  NA 134* 135 136  K 4.3 3.9 3.7  CL 104 102 102  CO2 26 27 30   GLUCOSE 147* 149* 122*  BUN 45* 74* 60*  CREATININE 1.54* 2.23* 2.02*  CALCIUM 8.4* 8.3* 7.7*  ALBUMIN  --  2.3*  --   GFRNONAA 42* 27* 30*  GFRAA 49* 31* 35*  ANIONGAP 4* 6 4*     Hematology Recent Labs  Lab 09/13/19 0259 09/14/19 0312 09/15/19 1507  WBC 11.9* 11.0* 13.2*  RBC 2.81* 2.28* 2.18*  HGB 8.7* 7.1* 6.9*  HCT 29.5* 22.7* 20.7*  MCV 105.0* 99.6 95.0  MCH 31.0 31.1 31.7  MCHC 29.5* 31.3 33.3  RDW 17.2* 17.2* 18.4*  PLT 140* 122* 112*  BNPNo results for input(s): BNP, PROBNP in the last 168 hours.   DDimer No results for input(s): DDIMER in the last 168 hours.   Radiology    PERIPHERAL VASCULAR CATHETERIZATION  Result Date: 09/14/2019 See Op Note  DG Chest Port 1 View  Result Date: 09/14/2019 CLINICAL DATA:  PICC line placement. EXAM: PORTABLE CHEST 1 VIEW COMPARISON:  Radiographs earlier the same date and 09/11/2019. FINDINGS: 1416 hours. There is a new left IJ hemodialysis catheter, projecting to the level of the mid right atrium. No visible PICC line. Stable cardiomegaly, aortic atherosclerosis and mild pulmonary edema. There are probable small bilateral pleural effusions. No pneumothorax. The bones appear unchanged. IMPRESSION: 1. New left IJ hemodialysis catheter tip projects to the level of  the mid right atrium. No visible PICC line. 2. Stable cardiomegaly, mild pulmonary edema and probable small bilateral pleural effusions. Electronically Signed   By: Richardean Sale M.D.   On: 09/14/2019 14:37    Cardiac Studies   Echo 08/2019 1. Left ventricular ejection fraction, by estimation, is 55 to 60%. The  left ventricle has normal function. The left ventricle has no regional  wall motion abnormalities. Left ventricular diastolic parameters are  indeterminate. Septal flattening in  systole consistent with elevated right heart pressures.  2. Right ventricular systolic function was not well visualized. The right  ventricular size is moderately enlarged. There is severely elevated  pulmonary artery systolic pressure. The estimated right ventricular  systolic pressure is 75.9 mmHg.  3. Left atrial size was mildly dilated.   Patient Profile     79 y.o. male with history of morbid obesity, OSA, CKD on dialysis, being seen due to persistent atrial fibrillation with rapid ventricular response.  Assessment & Plan    1.  Persistent atrial fibrillation -Heart rate currently controlled off AV nodal agents. -H&H further drop, as hemoglobin 6.9. -Continue to hold anticoagulation -This may prevent long-term anticoagulation. -Last EF 55 to 60%.  2.  Hypercapnic respiratory failure, OSA -CPAP  3.  History of hypertension, was hypotensive. -BP stable with midodrine -Continue midodrine  4.  Pulmonary hypertension -OSA, OHS, morbid obesity contributing. -Volume control with dialysis.  5.  CKD on dialysis -HD as per renal.  -Unless source of bleeding or anemia is resolved, anticoagulation not recommended.  Heart rate controlled off AV nodal agents.  No additional cardiac input at this time.  Please let us know if further cardiac input is needed.  Cardiology will sign off.  Greater than 50% was spent in counseling and coordination of care with patient Total encounter time 35  minutes or more   Signed, Kate Sable, MD  09/16/2019, 11:11 AM

## 2019-09-16 NOTE — Progress Notes (Signed)
PROGRESS NOTE    Andrew Valentine  QQI:[id number] DOB: 1940/04/01 DOA: 09/08/2019 PCP: [facility]    Brief Narrative:  [phone] hx of Morbid obesity BMI >50, CHF, DM, hypothoroidism, came from home due to dehydration and AKI.  He lives alone and cannot take care of himself. He was noted to be lethargic in ED with hypercapnia and hypoxemia.  I spoke to daughter she states she came to check on him and blood glucose was 47, he was unable to get out of chair. He was weak could not walk, desaturating 70s.  He was encephalopathic at this time. Daughter lives next door.    SIGNIFICANT EVENTS: 09/10/19- patient is awake this am, he on levphed 25, hes on CRRT with dyasylate modifcation this am by renal team. S/p wound care eval, UOP has improved.   8/31- patient weaned off vasopressin, weaned levophed from 25>>10.  9/1 - patient was evaluated by PALS, he does not wish to have permanent dialysis.  Code status discussion in progress.  9/2- Plan to remove Left IJ HD catheter, trial of Lasix as per Nephrology 9/3- patient reports feeling well, he ate dinner states he feels well. S/p vascular eval for perm cath placement. 1 unit prbc transfustion ordered, heparin stopped due to borderline low h/h 9/4 medical therapy    Consultants:   Pulmonary, cardiology, nephrology, vascular surgery  Procedures: Lines/tubes  Antimicrobials:  Augmentin   Subjective: Sitting in bed. Pale. Denies sob, cp, dizziness.      Objective: Vitals:   09/15/19 2229 09/16/19 0153 09/16/19 0733 09/16/19 1526  BP: 120/60 (!) 109/50 (!) 114/54 124/64  Pulse: 82 74 61 68  Resp: 16 16 18 17   Temp: 98.2 F (36.8 C) 98.5 F (36.9 C) 97.9 F (36.6 C) 98.2 F (36.8 C)  TempSrc: Oral Oral  Oral  SpO2: 95% 95% 100% 100%  Weight:      Height:        Intake/Output Summary (Last 24 hours) at 09/16/2019 1527 Last data filed at 09/16/2019 1208 Gross per 24 hour  Intake 760 ml  Output 750 ml  Net 10 ml    Filed Weights   09/12/19 0419 09/13/19 0437 09/14/19 1134  Weight: (!) 163.3 kg (!) 163.3 kg (!) 163.3 kg    Examination:  General exam: Appears calm and comfortable , nad, pale, pleasant.  Respiratory system: +fine rales b/l Cardiovascular system: S1 & S2 heard, RRR. Difficult to assess jvd. No murmurs, rubs, gallops or clicks. Gastrointestinal system: Abdomen is nondistended, soft and nontender. Normal bowel sounds heard. Central nervous system: Difficult to assess, but appears grossly intact Extremities:b/l LE edema Skin: Warm dry Psychiatry: Mood and affect appropriate     Data Reviewed: I have personally reviewed following labs and imaging studies  CBC: Recent Labs  Lab 09/12/19 0711 09/13/19 0259 09/14/19 0312 09/15/19 1507 09/16/19 1106  WBC 11.8* 11.9* 11.0* 13.2* 14.3*  HGB 8.8* 8.7* 7.1* 6.9* 7.5*  HCT 28.8* 29.5* 22.7* 20.7* 23.4*  MCV 103.6* 105.0* 99.6 95.0 98.3  PLT 129* 140* 122* 112* 740*   Basic Metabolic Panel: Recent Labs  Lab 09/12/19 0419 09/13/19 0259 09/14/19 0312 09/15/19 1414 09/16/19 1106  NA 135 134* 135 136 137  K 3.7 4.3 3.9 3.7 3.7  CL 104 104 102 102 102  CO2 27 26 27 30 28   GLUCOSE 154* 147* 149* 122* 180*  BUN 42* 45* 74* 60* 77*  CREATININE 1.69* 1.54* 2.23* 2.02* 2.44*  CALCIUM 8.9 8.4* 8.3* 7.7*  8.1*  MG 2.0 2.1 1.9 1.8 1.8  PHOS 2.9 2.3* 2.4*   2.5 3.5 3.6   GFR: Estimated Creatinine Clearance: 36.9 mL/min (A) (by C-G formula based on SCr of 2.44 mg/dL (H)). Liver Function Tests: Recent Labs  Lab 09/14/19 0312  ALBUMIN 2.3*   No results for input(s): LIPASE, AMYLASE in the last 168 hours. No results for input(s): AMMONIA in the last 168 hours. Coagulation Profile: No results for input(s): INR, PROTIME in the last 168 hours. Cardiac Enzymes: No results for input(s): CKTOTAL, CKMB, CKMBINDEX, TROPONINI in the last 168 hours. BNP (last 3 results) No results for input(s): PROBNP in the last 8760  hours. HbA1C: No results for input(s): HGBA1C in the last 72 hours. CBG: Recent Labs  Lab 09/15/19 2021 09/16/19 0000 09/16/19 0400 09/16/19 0735 09/16/19 1135  GLUCAP 127* 154* 142* 138* 163*   Lipid Profile: No results for input(s): CHOL, HDL, LDLCALC, TRIG, CHOLHDL, LDLDIRECT in the last 72 hours. Thyroid Function Tests: No results for input(s): TSH, T4TOTAL, FREET4, T3FREE, THYROIDAB in the last 72 hours. Anemia Panel: Recent Labs    09/14/19 0312  FERRITIN 55  TIBC 246*  IRON 69   Sepsis Labs: Recent Labs  Lab 09/10/19 0409 09/11/19 0433  PROCALCITON 0.62 0.47    Recent Results (from the past 240 hour(s))  MRSA PCR Screening     Status: None   Collection Time: 09/08/19 11:49 AM   Specimen: Nasopharyngeal  Result Value Ref Range Status   MRSA by PCR NEGATIVE NEGATIVE Final    Comment:        The GeneXpert MRSA Assay (FDA approved for NASAL specimens only), is one component of a comprehensive MRSA colonization surveillance program. It is not intended to diagnose MRSA infection nor to guide or monitor treatment for MRSA infections. Performed at [facility], [address] [place], [place] [address]   SARS Coronavirus 2 by RT PCR (hospital order, performed in [facility] hospital lab) Nasopharyngeal Nasopharyngeal Swab     Status: None   Collection Time: 09/08/19 12:05 PM   Specimen: Nasopharyngeal Swab  Result Value Ref Range Status   SARS Coronavirus 2 NEGATIVE NEGATIVE Final    Comment: (NOTE) SARS-CoV-2 target nucleic acids are NOT DETECTED.  The SARS-CoV-2 RNA is generally detectable in upper and lower respiratory specimens during the acute phase of infection. The lowest concentration of SARS-CoV-2 viral copies this assay can detect is 250 copies / mL. A negative result does not preclude SARS-CoV-2 infection and should not be used as the sole basis for treatment or other patient management decisions.  A negative result may occur  with improper specimen collection / handling, submission of specimen other than nasopharyngeal swab, presence of viral mutation(s) within the areas targeted by this assay, and inadequate number of viral copies (<250 copies / mL). A negative result must be combined with clinical observations, patient history, and epidemiological information.  Fact Sheet for Patients:   StrictlyIdeas.no  Fact Sheet for Healthcare Providers: BankingDealers.co.za  This test is not yet approved or  cleared by the [place] FDA and has been authorized for detection and/or diagnosis of SARS-CoV-2 by FDA under an Emergency Use Authorization (EUA).  This EUA will remain in effect (meaning this test can be used) for the duration of the COVID-19 declaration under Section 564(b)(1) of the Act, 21 U.S.C. section 360bbb-3(b)(1), unless the authorization is terminated or revoked sooner.  Performed at [facility], [address], [place], [place] [address]  Blood culture (routine x 2)     Status: Abnormal   Collection Time: 09/08/19 12:07 PM   Specimen: BLOOD  Result Value Ref Range Status   Specimen Description   Final    BLOOD L DISTAL FOREARM Performed at [facility], [address] [place], [place] [address]    Special Requests   Final    BOTTLES DRAWN AEROBIC AND ANAEROBIC Blood Culture results may not be optimal due to an excessive volume of blood received in culture bottles Performed at [facility], [address] [place], [place] [address]    Culture  Setup Time   Final    GRAM POSITIVE COCCI AEROBIC BOTTLE ONLY CRITICAL RESULT CALLED TO, READ BACK BY AND VERIFIED WITH: MORGAN CUNNINGHAM  AT 8099 ON 09/09/19 SNG    Culture (A)  Final    STAPHYLOCOCCUS EPIDERMIDIS THE SIGNIFICANCE OF ISOLATING THIS ORGANISM FROM A SINGLE SET OF BLOOD CULTURES WHEN MULTIPLE SETS ARE DRAWN IS UNCERTAIN. PLEASE NOTIFY THE MICROBIOLOGY  DEPARTMENT WITHIN ONE WEEK IF SPECIATION AND SENSITIVITIES ARE REQUIRED. Performed at [facility], [place] [address], [place], [place] [address]    Report Status 09/11/2019 FINAL  Final  Blood Culture ID Panel (Reflexed)     Status: Abnormal   Collection Time: 09/08/19 12:07 PM  Result Value Ref Range Status   Enterococcus faecalis NOT DETECTED NOT DETECTED Final   Enterococcus Faecium NOT DETECTED NOT DETECTED Final   Listeria monocytogenes NOT DETECTED NOT DETECTED Final   Staphylococcus species DETECTED (A) NOT DETECTED Final    Comment: CRITICAL RESULT CALLED TO, READ BACK BY AND VERIFIED WITH: MORGAN CUNNINGHAM AT 1345 ON 09/09/19 SNG    Staphylococcus aureus (BCID) NOT DETECTED NOT DETECTED Final   Staphylococcus epidermidis DETECTED (A) NOT DETECTED Final    Comment: CRITICAL RESULT CALLED TO, READ BACK BY AND VERIFIED WITH: MORGAN CUNNINGHAM AT 1345 ON 09/09/19 SNG    Staphylococcus lugdunensis NOT DETECTED NOT DETECTED Final   Streptococcus species NOT DETECTED NOT DETECTED Final   Streptococcus agalactiae NOT DETECTED NOT DETECTED Final   Streptococcus pneumoniae NOT DETECTED NOT DETECTED Final   Streptococcus pyogenes NOT DETECTED NOT DETECTED Final   A.calcoaceticus-baumannii NOT DETECTED NOT DETECTED Final   Bacteroides fragilis NOT DETECTED NOT DETECTED Final   Enterobacterales NOT DETECTED NOT DETECTED Final   Enterobacter cloacae complex NOT DETECTED NOT DETECTED Final   Escherichia coli NOT DETECTED NOT DETECTED Final   Klebsiella aerogenes NOT DETECTED NOT DETECTED Final   Klebsiella oxytoca NOT DETECTED NOT DETECTED Final   Klebsiella pneumoniae NOT DETECTED NOT DETECTED Final   Proteus species NOT DETECTED NOT DETECTED Final   Salmonella species NOT DETECTED NOT DETECTED Final   Serratia marcescens NOT DETECTED NOT DETECTED Final   Haemophilus influenzae NOT DETECTED NOT DETECTED Final   Neisseria meningitidis NOT DETECTED NOT DETECTED Final   Pseudomonas  aeruginosa NOT DETECTED NOT DETECTED Final   Stenotrophomonas maltophilia NOT DETECTED NOT DETECTED Final   Candida albicans NOT DETECTED NOT DETECTED Final   Candida auris NOT DETECTED NOT DETECTED Final   Candida glabrata NOT DETECTED NOT DETECTED Final   Candida krusei NOT DETECTED NOT DETECTED Final   Candida parapsilosis NOT DETECTED NOT DETECTED Final   Candida tropicalis NOT DETECTED NOT DETECTED Final   Cryptococcus neoformans/gattii NOT DETECTED NOT DETECTED Final   Methicillin resistance mecA/C NOT DETECTED NOT DETECTED Final    Comment: Performed at [facility], [address] [place], [place] [address]  Blood culture (routine x 2)     Status: None   Collection Time: 09/08/19 12:59 PM   Specimen: BLOOD  Result Value Ref Range Status   Specimen Description BLOOD LEFT UPPER FORARM  Final   Special Requests   Final    BOTTLES DRAWN AEROBIC AND ANAEROBIC Blood Culture results may not be optimal due to an excessive volume of blood received in culture bottles   Culture   Final    NO GROWTH 5 DAYS Performed at [facility], [address] [place], [place] [address]    Report Status 09/13/2019 FINAL  Final         Radiology Studies: No results found.      Scheduled Meds:  sodium chloride   Intravenous Once   amoxicillin-clavulanate  1 tablet Oral Q12H   Chlorhexidine Gluconate Cloth  6 each Topical Daily   fludrocortisone  0.2 mg Oral BID   hydrocortisone sod succinate (SOLU-CORTEF) inj  100 mg Intravenous Q12H   insulin aspart  0-15 Units Subcutaneous Q4H   ipratropium-albuterol  3 mL Nebulization TID   levothyroxine  175 mcg Oral Daily   mouth rinse  15 mL Mouth Rinse BID   midodrine  10 mg Oral TID WC   nystatin cream   Topical BID   pantoprazole (PROTONIX) IV  40 mg Intravenous Q24H   senna  1 tablet Oral BID   zinc oxide  1 application Topical Daily   Continuous Infusions:  albumin human 12.5 g (09/16/19 1344)     Assessment & Plan:   Active Problems:   Acute hypoxemic respiratory failure (HCC)   Acute renal failure (HCC)   Hyperkalemia   NSTEMI (non-ST elevated myocardial infarction) (HCC)   Pressure injury of skin   Pulmonary hypertension, unspecified ([place])   1.  Acute hypoxic respiratory failure Transferred from ICU. Covid -8/29 Most likely due to OHS/chronic thoracic restriction with con commitment OSA RVSP elevated, has pulmonary htn found on echo. cxr 8/31 with right-sided infiltrate suggestive of aspiration pneumonia versus CAP CAP-azithromycin Rocephin initially and de-escalate to Augmentin We will obtain new chest x-ray in a.m. to check for improvement Currently on 2 L satting 100% Asked nsg to make sure pt on cpap for osa.    2.A/CKD stage IIIB Baseline creatinine reported baseline per nephrology's note is 1.5 Nonoliguric urine output Required renal placement therapy Dialysis per nephrology Status post insertion of temporary dialysis catheter to the left IJ on 9/3 Had dialysis yesterday Per nephrology add IV albumin for oncotic support needs   3.  Persistent  afib-rate relatively controlled Cardiology following No plans to restore to normal sinus Heparin drip was discontinued due to anemia and drop in H&H, received status post 1 unit packed red blood cells on 9/3 Continue to hold anticoagulation His anemia may prevent long-term anticoagulation Last EF 55 to 60%   4.Hypotensive -in icu. Was on vasopressors.  bp holding off pressors Continue midodrine  Drop in H/H/anemia- etiology?  Was on heparin drip, drop in H&H happened, status post 1 unit packed red blood cell on 9/3 1 unit of packed red blood cells on 9/4 We will obtain stool occult   DVT prophylaxis: scd Code Status:partial Family Communication: Spoke to son via phone, phone [phone]  Status is: Inpatient  Remains inpatient appropriate because:Inpatient level of care appropriate due to  severity of illness   Dispo: The patient is from: Home              Anticipated  d/c is to: likely snf              Anticipated d/c date is: > 3 days              Patient currently is not medically stable to d/c.Pt currently undergoing HD, now anemia, pending w/u .             LOS: 8 days   Time spent: 35 min > 50% on coc    Nolberto Hanlon, MD [facility] Pager [phone]  If 7PM-7AM, please contact night-coverage [email or web address] Password [facility] 09/16/2019, 3:27 PM

## 2019-09-16 NOTE — Progress Notes (Signed)
PT Cancellation Note  Patient Details Name: Andrew Valentine MRN: [id number] DOB: 06-03-1940   Cancelled Treatment:    Reason Eval/Treat Not Completed: Medical issues which prohibited therapy   Chart reviewed.  HgB 6.9 and trending down. Will hold this am and continue as appropriate.   Chesley Noon 09/16/2019, 8:03 AM

## 2019-09-16 NOTE — TOC Progression Note (Addendum)
Transition of Care [facility]) - Progression Note    Patient Details  Name: Andrew Valentine MRN: [id number] Date of Birth: [birth year]  Transition of Care [facility] CM/SW Contact  Boris Sharper, LCSW Phone Number: 09/16/2019, 10:29 AM  Clinical Narrative:    CSW called to provide pt's daughter with bed offers, did not answer, left voicemail  10:40am: Pt's daughter called back and notified me that that she had not been notified of anything. She was unaware that a bed search had been sent out. CSW explained the process and she wanted to talk it over with her brother. CSW provided contact info for follow up.         Expected Discharge Plan and Services                                                 Social Determinants of Health (SDOH) Interventions    Readmission Risk Interventions No flowsheet data found.

## 2019-09-17 ENCOUNTER — Inpatient Hospital Stay: Payer: Medicare Other

## 2019-09-17 DIAGNOSIS — I4819 Other persistent atrial fibrillation: Secondary | ICD-10-CM

## 2019-09-17 DIAGNOSIS — D649 Anemia, unspecified: Secondary | ICD-10-CM

## 2019-09-17 LAB — GLUCOSE, CAPILLARY
Glucose-Capillary: 141 mg/dL — ABNORMAL HIGH (ref 70–99)
Glucose-Capillary: 152 mg/dL — ABNORMAL HIGH (ref 70–99)
Glucose-Capillary: 165 mg/dL — ABNORMAL HIGH (ref 70–99)
Glucose-Capillary: 178 mg/dL — ABNORMAL HIGH (ref 70–99)
Glucose-Capillary: 184 mg/dL — ABNORMAL HIGH (ref 70–99)
Glucose-Capillary: 188 mg/dL — ABNORMAL HIGH (ref 70–99)

## 2019-09-17 LAB — PHOSPHORUS: Phosphorus: 4.4 mg/dL (ref 2.5–4.6)

## 2019-09-17 LAB — CBC
HCT: 23.5 % — ABNORMAL LOW (ref 39.0–52.0)
Hemoglobin: 7.7 g/dL — ABNORMAL LOW (ref 13.0–17.0)
MCH: 31.7 pg (ref 26.0–34.0)
MCHC: 32.8 g/dL (ref 30.0–36.0)
MCV: 96.7 fL (ref 80.0–100.0)
Platelets: 126 10*3/uL — ABNORMAL LOW (ref 150–400)
RBC: 2.43 MIL/uL — ABNORMAL LOW (ref 4.22–5.81)
RDW: 18.6 % — ABNORMAL HIGH (ref 11.5–15.5)
WBC: 13.9 10*3/uL — ABNORMAL HIGH (ref 4.0–10.5)
nRBC: 0.6 % — ABNORMAL HIGH (ref 0.0–0.2)

## 2019-09-17 LAB — BASIC METABOLIC PANEL
Anion gap: 5 (ref 5–15)
BUN: 81 mg/dL — ABNORMAL HIGH (ref 8–23)
CO2: 29 mmol/L (ref 22–32)
Calcium: 8.3 mg/dL — ABNORMAL LOW (ref 8.9–10.3)
Chloride: 105 mmol/L (ref 98–111)
Creatinine, Ser: 2.78 mg/dL — ABNORMAL HIGH (ref 0.61–1.24)
GFR calc Af Amer: 24 mL/min — ABNORMAL LOW (ref >60)
GFR calc non Af Amer: 21 mL/min — ABNORMAL LOW (ref >60)
Glucose, Bld: 165 mg/dL — ABNORMAL HIGH (ref 70–99)
Potassium: 3.8 mmol/L (ref 3.5–5.1)
Sodium: 139 mmol/L (ref 135–145)

## 2019-09-17 LAB — MAGNESIUM: Magnesium: 2.1 mg/dL (ref 1.7–2.4)

## 2019-09-17 MED ORDER — IPRATROPIUM-ALBUTEROL 0.5-2.5 (3) MG/3ML IN SOLN
3.0000 mL | Freq: Two times a day (BID) | RESPIRATORY_TRACT | Status: DC
  Administered 2019-09-17 – 2019-09-19 (×4): 3 mL via RESPIRATORY_TRACT
  Filled 2019-09-17 (×4): qty 3

## 2019-09-17 MED ORDER — NEPRO/CARBSTEADY PO LIQD
237.0000 mL | Freq: Two times a day (BID) | ORAL | Status: DC
  Administered 2019-09-17 – 2019-09-18 (×4): 237 mL via ORAL

## 2019-09-17 NOTE — Care Management Important Message (Signed)
Important Message  Patient Details  Name: Andrew Valentine MRN: [id number] Date of Birth: April 17, 1940   Medicare Important Message Given:  Yes     Dannette Barbara 09/17/2019, 11:21 AM

## 2019-09-17 NOTE — Progress Notes (Signed)
PROGRESS NOTE    Andrew Valentine  ZOX:[id number] DOB: Mar 14, 1940 DOA: 09/08/2019 PCP: [facility]    Brief Narrative:  [phone] hx of Morbid obesity BMI >50, CHF, DM, hypothoroidism, came from home due to dehydration and AKI.  He lives alone and cannot take care of himself. He was noted to be lethargic in ED with hypercapnia and hypoxemia.  I spoke to daughter she states she came to check on him and blood glucose was 47, he was unable to get out of chair. He was weak could not walk, desaturating 70s.  He was encephalopathic at this time. Daughter lives next door.    SIGNIFICANT EVENTS: 09/10/19- patient is awake this am, he on levphed 25, hes on CRRT with dyasylate modifcation this am by renal team. S/p wound care eval, UOP has improved.   8/31- patient weaned off vasopressin, weaned levophed from 25>>10.  9/1 - patient was evaluated by PALS, he does not wish to have permanent dialysis.  Code status discussion in progress.  9/2- Plan to remove Left IJ HD catheter, trial of Lasix as per Nephrology 9/3- patient reports feeling well, he ate dinner states he feels well. S/p vascular eval for perm cath placement. 1 unit prbc transfustion ordered, heparin stopped due to borderline low h/h 9/4 medical therapy    Consultants:   Pulmonary, cardiology, nephrology, vascular surgery  Procedures: Lines/tubes  Antimicrobials:  Augmentin   Subjective: Sitting in bed. Pale. Denies sob, cp, dizziness.      Objective: Vitals:   09/16/19 0733 09/16/19 1526 09/16/19 2237 09/17/19 0811  BP: (!) 114/54 124/64 (!) 108/57 (!) 110/51  Pulse: 61 68 81 79  Resp: 18 17 16 14   Temp: 97.9 F (36.6 C) 98.2 F (36.8 C) 98.9 F (37.2 C) 97.7 F (36.5 C)  TempSrc:  Oral Oral Oral  SpO2: 100% 100% 95% 94%  Weight:      Height:        Intake/Output Summary (Last 24 hours) at 09/17/2019 1501 Last data filed at 09/17/2019 1023 Gross per 24 hour  Intake 350 ml  Output 650 ml  Net -300 ml     Filed Weights   09/12/19 0419 09/13/19 0437 09/14/19 1134  Weight: (!) 163.3 kg (!) 163.3 kg (!) 163.3 kg    Examination:  General exam: Appears calm and comfortable , nad, pale, pleasant.  Respiratory system: +fine rales b/l Cardiovascular system: S1 & S2 heard, RRR. Difficult to assess jvd. No murmurs, rubs, gallops or clicks. Gastrointestinal system: Abdomen is nondistended, soft and nontender. Normal bowel sounds heard. Central nervous system: Difficult to assess, but appears grossly intact Extremities:b/l LE edema Skin: Warm dry Psychiatry: Mood and affect appropriate     Data Reviewed: I have personally reviewed following labs and imaging studies  CBC: Recent Labs  Lab 09/13/19 0259 09/14/19 0312 09/15/19 1507 09/16/19 1106 09/17/19 0803  WBC 11.9* 11.0* 13.2* 14.3* 13.9*  HGB 8.7* 7.1* 6.9* 7.5* 7.7*  HCT 29.5* 22.7* 20.7* 23.4* 23.5*  MCV 105.0* 99.6 95.0 98.3 96.7  PLT 140* 122* 112* 129* 914*   Basic Metabolic Panel: Recent Labs  Lab 09/13/19 0259 09/14/19 0312 09/15/19 1414 09/16/19 1106 09/17/19 0803  NA 134* 135 136 137 139  K 4.3 3.9 3.7 3.7 3.8  CL 104 102 102 102 105  CO2 26 27 30 28 29   GLUCOSE 147* 149* 122* 180* 165*  BUN 45* 74* 60* 77* 81*  CREATININE 1.54* 2.23* 2.02* 2.44* 2.78*  CALCIUM 8.4* 8.3*  7.7* 8.1* 8.3*  MG 2.1 1.9 1.8 1.8 2.1  PHOS 2.3* 2.4*  2.5 3.5 3.6 4.4   GFR: Estimated Creatinine Clearance: 32.4 mL/min (A) (by C-G formula based on SCr of 2.78 mg/dL (H)). Liver Function Tests: Recent Labs  Lab 09/14/19 0312  ALBUMIN 2.3*   No results for input(s): LIPASE, AMYLASE in the last 168 hours. No results for input(s): AMMONIA in the last 168 hours. Coagulation Profile: No results for input(s): INR, PROTIME in the last 168 hours. Cardiac Enzymes: No results for input(s): CKTOTAL, CKMB, CKMBINDEX, TROPONINI in the last 168 hours. BNP (last 3 results) No results for input(s): PROBNP in the last 8760  hours. HbA1C: No results for input(s): HGBA1C in the last 72 hours. CBG: Recent Labs  Lab 09/16/19 2048 09/17/19 0045 09/17/19 0501 09/17/19 0814 09/17/19 1139  GLUCAP 147* 165* 141* 152* 184*   Lipid Profile: No results for input(s): CHOL, HDL, LDLCALC, TRIG, CHOLHDL, LDLDIRECT in the last 72 hours. Thyroid Function Tests: No results for input(s): TSH, T4TOTAL, FREET4, T3FREE, THYROIDAB in the last 72 hours. Anemia Panel: No results for input(s): VITAMINB12, FOLATE, FERRITIN, TIBC, IRON, RETICCTPCT in the last 72 hours. Sepsis Labs: Recent Labs  Lab 09/11/19 0433  PROCALCITON 0.47    Recent Results (from the past 240 hour(s))  MRSA PCR Screening     Status: None   Collection Time: 09/08/19 11:49 AM   Specimen: Nasopharyngeal  Result Value Ref Range Status   MRSA by PCR NEGATIVE NEGATIVE Final    Comment:        The GeneXpert MRSA Assay (FDA approved for NASAL specimens only), is one component of a comprehensive MRSA colonization surveillance program. It is not intended to diagnose MRSA infection nor to guide or monitor treatment for MRSA infections. Performed at [facility], [address] [place], [place] [address]   SARS Coronavirus 2 by RT PCR (hospital order, performed in [facility] hospital lab) Nasopharyngeal Nasopharyngeal Swab     Status: None   Collection Time: 09/08/19 12:05 PM   Specimen: Nasopharyngeal Swab  Result Value Ref Range Status   SARS Coronavirus 2 NEGATIVE NEGATIVE Final    Comment: (NOTE) SARS-CoV-2 target nucleic acids are NOT DETECTED.  The SARS-CoV-2 RNA is generally detectable in upper and lower respiratory specimens during the acute phase of infection. The lowest concentration of SARS-CoV-2 viral copies this assay can detect is 250 copies / mL. A negative result does not preclude SARS-CoV-2 infection and should not be used as the sole basis for treatment or other patient management decisions.  A negative result may  occur with improper specimen collection / handling, submission of specimen other than nasopharyngeal swab, presence of viral mutation(s) within the areas targeted by this assay, and inadequate number of viral copies (<250 copies / mL). A negative result must be combined with clinical observations, patient history, and epidemiological information.  Fact Sheet for Patients:   StrictlyIdeas.no  Fact Sheet for Healthcare Providers: BankingDealers.co.za  This test is not yet approved or  cleared by the [place] FDA and has been authorized for detection and/or diagnosis of SARS-CoV-2 by FDA under an Emergency Use Authorization (EUA).  This EUA will remain in effect (meaning this test can be used) for the duration of the COVID-19 declaration under Section 564(b)(1) of the Act, 21 U.S.C. section 360bbb-3(b)(1), unless the authorization is terminated or revoked sooner.  Performed at [facility], [address], [place], [place] [address]   Blood culture (routine x  2)     Status: Abnormal   Collection Time: 09/08/19 12:07 PM   Specimen: BLOOD  Result Value Ref Range Status   Specimen Description   Final    BLOOD L DISTAL FOREARM Performed at [facility], [address] [place], [place] [address]    Special Requests   Final    BOTTLES DRAWN AEROBIC AND ANAEROBIC Blood Culture results may not be optimal due to an excessive volume of blood received in culture bottles Performed at [facility], [address] [place], [place] [address]    Culture  Setup Time   Final    GRAM POSITIVE COCCI AEROBIC BOTTLE ONLY CRITICAL RESULT CALLED TO, READ BACK BY AND VERIFIED WITH: MORGAN CUNNINGHAM  AT 5631 ON 09/09/19 SNG    Culture (A)  Final    STAPHYLOCOCCUS EPIDERMIDIS THE SIGNIFICANCE OF ISOLATING THIS ORGANISM FROM A SINGLE SET OF BLOOD CULTURES WHEN MULTIPLE SETS ARE DRAWN IS UNCERTAIN. PLEASE NOTIFY THE  MICROBIOLOGY DEPARTMENT WITHIN ONE WEEK IF SPECIATION AND SENSITIVITIES ARE REQUIRED. Performed at [facility], [place] [address], [place], [place] [address]    Report Status 09/11/2019 FINAL  Final  Blood Culture ID Panel (Reflexed)     Status: Abnormal   Collection Time: 09/08/19 12:07 PM  Result Value Ref Range Status   Enterococcus faecalis NOT DETECTED NOT DETECTED Final   Enterococcus Faecium NOT DETECTED NOT DETECTED Final   Listeria monocytogenes NOT DETECTED NOT DETECTED Final   Staphylococcus species DETECTED (A) NOT DETECTED Final    Comment: CRITICAL RESULT CALLED TO, READ BACK BY AND VERIFIED WITH: MORGAN CUNNINGHAM AT 1345 ON 09/09/19 SNG    Staphylococcus aureus (BCID) NOT DETECTED NOT DETECTED Final   Staphylococcus epidermidis DETECTED (A) NOT DETECTED Final    Comment: CRITICAL RESULT CALLED TO, READ BACK BY AND VERIFIED WITH: MORGAN CUNNINGHAM AT 1345 ON 09/09/19 SNG    Staphylococcus lugdunensis NOT DETECTED NOT DETECTED Final   Streptococcus species NOT DETECTED NOT DETECTED Final   Streptococcus agalactiae NOT DETECTED NOT DETECTED Final   Streptococcus pneumoniae NOT DETECTED NOT DETECTED Final   Streptococcus pyogenes NOT DETECTED NOT DETECTED Final   A.calcoaceticus-baumannii NOT DETECTED NOT DETECTED Final   Bacteroides fragilis NOT DETECTED NOT DETECTED Final   Enterobacterales NOT DETECTED NOT DETECTED Final   Enterobacter cloacae complex NOT DETECTED NOT DETECTED Final   Escherichia coli NOT DETECTED NOT DETECTED Final   Klebsiella aerogenes NOT DETECTED NOT DETECTED Final   Klebsiella oxytoca NOT DETECTED NOT DETECTED Final   Klebsiella pneumoniae NOT DETECTED NOT DETECTED Final   Proteus species NOT DETECTED NOT DETECTED Final   Salmonella species NOT DETECTED NOT DETECTED Final   Serratia marcescens NOT DETECTED NOT DETECTED Final   Haemophilus influenzae NOT DETECTED NOT DETECTED Final   Neisseria meningitidis NOT DETECTED NOT DETECTED Final    Pseudomonas aeruginosa NOT DETECTED NOT DETECTED Final   Stenotrophomonas maltophilia NOT DETECTED NOT DETECTED Final   Candida albicans NOT DETECTED NOT DETECTED Final   Candida auris NOT DETECTED NOT DETECTED Final   Candida glabrata NOT DETECTED NOT DETECTED Final   Candida krusei NOT DETECTED NOT DETECTED Final   Candida parapsilosis NOT DETECTED NOT DETECTED Final   Candida tropicalis NOT DETECTED NOT DETECTED Final   Cryptococcus neoformans/gattii NOT DETECTED NOT DETECTED Final   Methicillin resistance mecA/C NOT DETECTED NOT DETECTED Final    Comment: Performed at [facility], [address] [place], [place] [address]  Blood culture (routine x  2)     Status: None   Collection Time: 09/08/19 12:59 PM   Specimen: BLOOD  Result Value Ref Range Status   Specimen Description BLOOD LEFT UPPER FORARM  Final   Special Requests   Final    BOTTLES DRAWN AEROBIC AND ANAEROBIC Blood Culture results may not be optimal due to an excessive volume of blood received in culture bottles   Culture   Final    NO GROWTH 5 DAYS Performed at [facility], [address] [place], [place] [address]    Report Status 09/13/2019 FINAL  Final         Radiology Studies: [facility] Chest Port 1 View  Result Date: 09/17/2019 CLINICAL DATA:  Pneumonia. EXAM: PORTABLE CHEST 1 VIEW COMPARISON:  September 14, 2019. FINDINGS: Stable cardiomegaly. Left internal jugular dialysis catheter is unchanged in position. No pneumothorax is noted. Stable bilateral perihilar and basilar opacities are noted concerning for pulmonary edema. Small pleural effusions may be present. Bony thorax is unremarkable. IMPRESSION: Stable bilateral pulmonary edema. Electronically Signed   By: Marijo Conception M.D.   On: 09/17/2019 08:12        Scheduled Meds: . sodium chloride   Intravenous Once  . amoxicillin-clavulanate  1 tablet Oral Q12H  . Chlorhexidine Gluconate Cloth  6 each Topical Daily  . feeding supplement  (NEPRO CARB STEADY)  237 mL Oral BID BM  . fludrocortisone  0.2 mg Oral BID  . hydrocortisone sod succinate (SOLU-CORTEF) inj  100 mg Intravenous Q12H  . insulin aspart  0-15 Units Subcutaneous Q4H  . ipratropium-albuterol  3 mL Nebulization BID  . levothyroxine  175 mcg Oral Daily  . mouth rinse  15 mL Mouth Rinse BID  . midodrine  10 mg Oral TID WC  . nystatin cream   Topical BID  . pantoprazole (PROTONIX) IV  40 mg Intravenous Q24H  . senna  1 tablet Oral BID  . zinc oxide  1 application Topical Daily   Continuous Infusions: . albumin human 12.5 g (09/17/19 0925)    Assessment & Plan:   Active Problems:   Acute hypoxemic respiratory failure (HCC)   Acute renal failure (HCC)   Hyperkalemia   NSTEMI (non-ST elevated myocardial infarction) (HCC)   Pressure injury of skin   Pulmonary hypertension, unspecified (HCC)   Persistent atrial fibrillation (HCC)   Anemia   1.  Acute hypoxic respiratory failure Transferred from ICU. Covid -8/29 Most likely due to OHS/chronic thoracic restriction with con commitment OSA RVSP elevated, has pulmonary htn found on echo. cxr 8/31 with right-sided infiltrate suggestive of aspiration pneumonia versus CAP CAP-azithromycin Rocephin initially and de-escalate to Augmentin Chest x-ray today reveals stable bilateral pulmonary edema We will keep O2 sat above 92%     2.A/CKD stage IIIB Baseline creatinine reported baseline per nephrology's note is 1.5 Nonoliguric urine output Required renal placement therapy Dialysis per nephrology interactive with me with with the urine getting some of the fluid is renal function is a little bit worse yesterday was 2.4 today 2.71 did not plan to dialyze today that I am seeing based on 9/3-Status post insertion of temporary dialysis catheter to the left IJ  Hemodialysis on 9/4 9/6 still with fluid Creatinine 2.78 today.  Nephrology adding albumin Per nephrology add IV albumin for oncotic support    3.   Persistent  afib-rate relatively controlled Cardiology following  No plans to restore to normal sinus  Heparin drip was discontinued due to anemia and drop in H&H.  Receive status post 1 unit packed red blood cell on 9/3  His anemia may prevent long-term anticoagulation  Last EF 55 to 60%     4.Hypotensive -in icu. Was on vasopressors.  Blood pressure holding off pressors Continue midodrine Continue fludrocortisone 0.2 mg twice daily   Drop in H/H/anemia- etiology?  Was on heparin drip, drop in H&H happened, status post 1 unit packed red blood cell on 9/3 1 unit of packed red blood cells on 9/4 -9/6 Checking stool occult which is pending Hemoglobin 7.7 today, no need for transfusion, will continue to monitor     DVT prophylaxis: scd Code Status:partial Family Communication: Spoke to son via phone, phone [phone]  Status is: Inpatient  Remains inpatient appropriate because:Inpatient level of care appropriate due to severity of illness   Dispo: The patient is from: Home              Anticipated d/c is to: likely snf              Anticipated d/c date is: > 3 days              Patient currently is not medically stable to d/c.Pt currently undergoing HD, now anemia, getting iv albumin.             LOS: 9 days   Time spent: 35 min > 50% on coc    Nolberto Hanlon, MD [facility] Pager [phone]  If 7PM-7AM, please contact night-coverage [email or web address] Password [email or web address] 09/17/2019, 3:01 PM

## 2019-09-17 NOTE — Progress Notes (Signed)
Speech Language Pathology Treatment: Dysphagia  Patient Details Name: Andrew Valentine MRN: [id number] DOB: 06/16/1940 Today's Date: 09/17/2019 Time: 1045-1130 SLP Time Calculation (min) (ACUTE ONLY): 45 min  Assessment / Plan / Recommendation Clinical Impression  Pt seen for ongoing assessment of swallowing; trials to upgrade to a soft solid foods diet. He appears much improved and alert today; verbally responsive and able to follow instructions w/ min cues. He engaged easily w/ SLP but continued to note ease of SOB/WOB w/ any exertion including talking w/ SLP. Pt is on RA; wbc wnl.  Pt explained general aspiration precautions and agreed verbally to the need for following them especially sitting upright for all oral intake. Pt assisted w/ positioning d/t weakness then given trials of thin liquids, purees and soft solids. No overt clinical s/s of aspiration were noted w/ any consistency; respiratory status remained calm and unlabored, vocal quality clear b/t trials. Pt helped to hold Cup when drinking following instructions for single, small sips slowly. Oral phase appeared grossly WFL for bolus management, mastication, and timely A-P transfer for swallowing w/ all trials including trials of Soft Solid foods; oral clearing achieved w/ all consistencies. Noted slight+ increased WOB w/ the exertion of the physical task so pt was was instructed on/given Rest Breaks w/ all oral intake to lessen effects of exertion and to decrease risk for aspiration. Pt followed instructions w/ SLP. Recommend upgrade to Dysphagia level 3 diet (mech soft) w/ gravies added to moisten foods; Thin liquids. Recommend general aspiration precautions including moistening foods and chewing solids well; taking rest breaks b/t bites to lessen any SOB/WOB during exertion of oral intake; Pills Whole in Puree for safer swallowing in bed; tray setup and positioning assistance for meals. ST services will continue to be available for any education if  needed while admitted. MD/NSG updated. Precautions posted at bedside.      HPI HPI: Pt is a 79M hx of Morbid obesity BMI >50, CHF, DM, hypothoroidism, came from home due to dehydration and AKI.  He lives alone and cannot take care of himself. He was noted to be lethargic in ED with hypercapnia and hypoxemia. Per Daughter, she stated pt had been feeling poorly for 1+ week w/ poor appetite. She came to check on him the morning of admit and blood glucose was 47, he was unable to get out of chair. He was weak could not walk, desaturating 70s.  He was encephalopathic at this time. Daughter lives next door.       SLP Plan  All goals met;Continue with current plan of care       Recommendations  Diet recommendations: Dysphagia 3 (mechanical soft);Thin liquid Liquids provided via: Cup Medication Administration: Whole meds with puree (for safer swallowing) Supervision: Patient able to self feed;Intermittent supervision to cue for compensatory strategies Compensations: Minimize environmental distractions;Slow rate;Small sips/bites;Lingual sweep for clearance of pocketing;Follow solids with liquid (Rest Breaks) Postural Changes and/or Swallow Maneuvers: Seated upright 90 degrees;Upright 30-60 min after meal                General recommendations:  (Dietician f/u) Oral Care Recommendations: Oral care BID;Oral care before and after PO;Staff/trained caregiver to provide oral care (Denture care) Follow up Recommendations: None SLP Visit Diagnosis: Dysphagia, unspecified (R13.10) (deconditioning; SOB w/ any exertion) Plan: All goals met;Continue with current plan of care       GO                 Katherine Watson, MS, CCC-SLP Speech   Language Pathologist Rehab Services [phone] [facility] 09/17/2019, 11:47 AM

## 2019-09-17 NOTE — Progress Notes (Signed)
Physical Therapy Treatment Patient Details Name: Andrew Valentine MRN: [id number] DOB: [birth year] Today's Date: 09/17/2019    History of Present Illness Pt is a 79 year old male with a known history of morbid obesity, sleep apnea intolerant noncompliant to CPAP, hypertension, diabetes, hyperlipidemia, hypothyroidism, remote history of tobacco abuse comes to the emergency room with altered mental status and hypoxia.  Pt was on CRRT starting 09/09/19 now off CRRT with plan for PermCath placement 09/14/19.  MD assessment includes: Acute on chronic kidney failure, acute hypoxic respiratory failure, pulmonary HTN, anemia, and hypotension.    PT Comments    Pt was pleasant and motivated to participate during the session.  Session limited to supine therex only this session secondary to limited staffing available for needed +3 total assist with mobility.  Pt tolerated below therex well with SpO2 >/= 92% throughout and HR WNL with pt putting forth good effort throughout.  Pt will benefit from PT services in a SNF setting upon discharge to safely address deficits listed in patient problem list for decreased caregiver assistance and eventual return to PLOF.       Follow Up Recommendations  SNF     Equipment Recommendations  Other (comment) (TBD at next venue of care)    Recommendations for Other Services       Precautions / Restrictions Precautions Precautions: Fall Restrictions Weight Bearing Restrictions: No Other Position/Activity Restrictions: Watch BP    Mobility  Bed Mobility               General bed mobility comments: Supine therex only this session  Transfers                    Ambulation/Gait                 Stairs             Wheelchair Mobility    Modified Rankin (Stroke Patients Only)       Balance                                            Cognition Arousal/Alertness: Awake/alert Behavior During Therapy: WFL for tasks  assessed/performed Overall Cognitive Status: Within Functional Limits for tasks assessed                                        Exercises Total Joint Exercises Ankle Circles/Pumps: AROM;Strengthening;Both;10 reps;15 reps (with manual resistance.) Quad Sets: Strengthening;Both;10 reps;15 reps Gluteal Sets: Strengthening;Both;10 reps;15 reps Towel Squeeze: Strengthening;Both;10 reps Short Arc Quad: Strengthening;Both;10 reps;15 reps (with manual resistance.) Heel Slides: AAROM;Strengthening;Both;10 reps Hip ABduction/ADduction: AAROM;Both;10 reps;Strengthening Straight Leg Raises: AAROM;Both;10 reps;Strengthening Other Exercises Other Exercises: HEP education for BLE APs, QS, and GS x 10 each 5-6x/day    General Comments        Pertinent Vitals/Pain Pain Assessment: No/denies pain    Home Living                      Prior Function            PT Goals (current goals can now be found in the care plan section) Progress towards PT goals: Progressing toward goals    Frequency    Min 2X/week      PT Plan Current  plan remains appropriate    Co-evaluation              AM-PAC PT "6 Clicks" Mobility   Outcome Measure  Help needed turning from your back to your side while in a flat bed without using bedrails?: Total Help needed moving from lying on your back to sitting on the side of a flat bed without using bedrails?: Total Help needed moving to and from a bed to a chair (including a wheelchair)?: Total Help needed standing up from a chair using your arms (e.g., wheelchair or bedside chair)?: Total Help needed to walk in hospital room?: Total Help needed climbing 3-5 steps with a railing? : Total 6 Click Score: 6    End of Session Equipment Utilized During Treatment: Oxygen Activity Tolerance: Patient tolerated treatment well Patient left: in bed;with call bell/phone within reach;with bed alarm set;with SCD's reapplied Nurse  Communication: Mobility status PT Visit Diagnosis: History of falling (Z91.81);Difficulty in walking, not elsewhere classified (R26.2);Muscle weakness (generalized) (M62.81)     Time: 9937-1696 PT Time Calculation (min) (ACUTE ONLY): 23 min  Charges:  $Therapeutic Exercise: 23-37 mins                     D. Royetta Asal PT, DPT 09/17/19, 5:16 PM

## 2019-09-17 NOTE — Progress Notes (Signed)
[facility]  ROUNDING NOTE   Subjective:   Laying in bed General condition appears to be improving States he ate a small breakfast.  No nausea or vomiting reported No shortness of breath Requiring oxygen supplementation- 2L Continues to have some lower extremity edema   Objective:  Vital signs in last 24 hours:  Temp:  [97.7 F (36.5 C)-98.9 F (37.2 C)] 97.7 F (36.5 C) (09/06 0811) Pulse Rate:  [68-81] 79 (09/06 0811) Resp:  [14-17] 14 (09/06 0811) BP: (108-124)/(51-64) 110/51 (09/06 0811) SpO2:  [94 %-100 %] 94 % (09/06 0811)  Weight change:  Filed Weights   09/12/19 0419 09/13/19 0437 09/14/19 1134  Weight: (!) 163.3 kg (!) 163.3 kg (!) 163.3 kg    Intake/Output: I/O last 3 completed shifts: In: 161 [P.O.:240; I.V.:100; Blood:420; IV Piggyback:110] Out: 450 [Urine:450]   Intake/Output this shift:  Total I/O In: 240 [P.O.:240] Out: 650 [Urine:650]  Physical Exam: General: Lying in bed, in no acute distress, morbidly obese  Head: Normocephalic, atraumatic. Moist oral mucosal membranes  Lungs:  Diminished bilaterally, [place] O2  Heart:  No rub  Abdomen:  Soft, nontender, obese  Extremities: + peripheral edema.  Neurologic:  Alert, able to answer questions appropriately  Skin: No lesions  Access: LIJ temp HD catheter    Basic Metabolic Panel: Recent Labs  Lab 09/13/19 0259 09/13/19 0259 09/14/19 0312 09/14/19 0312 09/15/19 1414 09/16/19 1106 09/17/19 0803  NA 134*  --  135  --  136 137 139  K 4.3  --  3.9  --  3.7 3.7 3.8  CL 104  --  102  --  102 102 105  CO2 26  --  27  --  30 28 29   GLUCOSE 147*  --  149*  --  122* 180* 165*  BUN 45*  --  74*  --  60* 77* 81*  CREATININE 1.54*  --  2.23*  --  2.02* 2.44* 2.78*  CALCIUM 8.4*   < > 8.3*   < > 7.7* 8.1* 8.3*  MG 2.1  --  1.9  --  1.8 1.8 2.1  PHOS 2.3*  --  2.4*  2.5  --  3.5 3.6 4.4   < > = values in this interval not displayed.    Liver Function Tests: Recent Labs  Lab  09/14/19 0312  ALBUMIN 2.3*   No results for input(s): LIPASE, AMYLASE in the last 168 hours. No results for input(s): AMMONIA in the last 168 hours.  CBC: Recent Labs  Lab 09/13/19 0259 09/14/19 0312 09/15/19 1507 09/16/19 1106 09/17/19 0803  WBC 11.9* 11.0* 13.2* 14.3* 13.9*  HGB 8.7* 7.1* 6.9* 7.5* 7.7*  HCT 29.5* 22.7* 20.7* 23.4* 23.5*  MCV 105.0* 99.6 95.0 98.3 96.7  PLT 140* 122* 112* 129* 126*    Cardiac Enzymes: No results for input(s): CKTOTAL, CKMB, CKMBINDEX, TROPONINI in the last 168 hours.  BNP: Invalid input(s): POCBNP  CBG: Recent Labs  Lab 09/16/19 1631 09/16/19 2048 09/17/19 0045 09/17/19 0501 09/17/19 0814  GLUCAP 149* 147* 165* 141* 152*    Microbiology: Results for orders placed or performed during the hospital encounter of 09/08/19  MRSA PCR Screening     Status: None   Collection Time: 09/08/19 11:49 AM   Specimen: Nasopharyngeal  Result Value Ref Range Status   MRSA by PCR NEGATIVE NEGATIVE Final    Comment:        The GeneXpert MRSA Assay (FDA approved for NASAL specimens only), is one  component of a comprehensive MRSA colonization surveillance program. It is not intended to diagnose MRSA infection nor to guide or monitor treatment for MRSA infections. Performed at [facility], [address] [place], [place] [address]   SARS Coronavirus 2 by RT PCR (hospital order, performed in [facility] hospital lab) Nasopharyngeal Nasopharyngeal Swab     Status: None   Collection Time: 09/08/19 12:05 PM   Specimen: Nasopharyngeal Swab  Result Value Ref Range Status   SARS Coronavirus 2 NEGATIVE NEGATIVE Final    Comment: (NOTE) SARS-CoV-2 target nucleic acids are NOT DETECTED.  The SARS-CoV-2 RNA is generally detectable in upper and lower respiratory specimens during the acute phase of infection. The lowest concentration of SARS-CoV-2 viral copies this assay can detect is 250 copies / mL. A negative result does not preclude  SARS-CoV-2 infection and should not be used as the sole basis for treatment or other patient management decisions.  A negative result may occur with improper specimen collection / handling, submission of specimen other than nasopharyngeal swab, presence of viral mutation(s) within the areas targeted by this assay, and inadequate number of viral copies (<250 copies / mL). A negative result must be combined with clinical observations, patient history, and epidemiological information.  Fact Sheet for Patients:   StrictlyIdeas.no  Fact Sheet for Healthcare Providers: BankingDealers.co.za  This test is not yet approved or  cleared by the [place] FDA and has been authorized for detection and/or diagnosis of SARS-CoV-2 by FDA under an Emergency Use Authorization (EUA).  This EUA will remain in effect (meaning this test can be used) for the duration of the COVID-19 declaration under Section 564(b)(1) of the Act, 21 U.S.C. section 360bbb-3(b)(1), unless the authorization is terminated or revoked sooner.  Performed at [facility], [address] [place], [place] [address]   Blood culture (routine x 2)     Status: Abnormal   Collection Time: 09/08/19 12:07 PM   Specimen: BLOOD  Result Value Ref Range Status   Specimen Description   Final    BLOOD L DISTAL FOREARM Performed at [facility], [address], [place], [place] [address]    Special Requests   Final    BOTTLES DRAWN AEROBIC AND ANAEROBIC Blood Culture results may not be optimal due to an excessive volume of blood received in culture bottles Performed at [facility], [address] [place], [place] [address]    Culture  Setup Time   Final    GRAM POSITIVE COCCI AEROBIC BOTTLE ONLY CRITICAL RESULT CALLED TO, READ BACK BY AND VERIFIED WITH: MORGAN CUNNINGHAM  AT 4970 ON 09/09/19 SNG    Culture (A)  Final    STAPHYLOCOCCUS EPIDERMIDIS THE  SIGNIFICANCE OF ISOLATING THIS ORGANISM FROM A SINGLE SET OF BLOOD CULTURES WHEN MULTIPLE SETS ARE DRAWN IS UNCERTAIN. PLEASE NOTIFY THE MICROBIOLOGY DEPARTMENT WITHIN ONE WEEK IF SPECIATION AND SENSITIVITIES ARE REQUIRED. Performed at [facility], [place] [address] [place], [place] [address]    Report Status 09/11/2019 FINAL  Final  Blood Culture ID Panel (Reflexed)     Status: Abnormal   Collection Time: 09/08/19 12:07 PM  Result Value Ref Range Status   Enterococcus faecalis NOT DETECTED NOT DETECTED Final   Enterococcus Faecium NOT DETECTED NOT DETECTED Final   Listeria monocytogenes NOT DETECTED NOT DETECTED Final   Staphylococcus species DETECTED (A) NOT DETECTED Final    Comment: CRITICAL RESULT CALLED TO, READ BACK BY AND VERIFIED WITH: MORGAN CUNNINGHAM AT 1345  ON 09/09/19 SNG    Staphylococcus aureus (BCID) NOT DETECTED NOT DETECTED Final   Staphylococcus epidermidis DETECTED (A) NOT DETECTED Final    Comment: CRITICAL RESULT CALLED TO, READ BACK BY AND VERIFIED WITH: MORGAN CUNNINGHAM AT 1345 ON 09/09/19 SNG    Staphylococcus lugdunensis NOT DETECTED NOT DETECTED Final   Streptococcus species NOT DETECTED NOT DETECTED Final   Streptococcus agalactiae NOT DETECTED NOT DETECTED Final   Streptococcus pneumoniae NOT DETECTED NOT DETECTED Final   Streptococcus pyogenes NOT DETECTED NOT DETECTED Final   A.calcoaceticus-baumannii NOT DETECTED NOT DETECTED Final   Bacteroides fragilis NOT DETECTED NOT DETECTED Final   Enterobacterales NOT DETECTED NOT DETECTED Final   Enterobacter cloacae complex NOT DETECTED NOT DETECTED Final   Escherichia coli NOT DETECTED NOT DETECTED Final   Klebsiella aerogenes NOT DETECTED NOT DETECTED Final   Klebsiella oxytoca NOT DETECTED NOT DETECTED Final   Klebsiella pneumoniae NOT DETECTED NOT DETECTED Final   Proteus species NOT DETECTED NOT DETECTED Final   Salmonella species NOT DETECTED NOT DETECTED Final   Serratia marcescens NOT DETECTED  NOT DETECTED Final   Haemophilus influenzae NOT DETECTED NOT DETECTED Final   Neisseria meningitidis NOT DETECTED NOT DETECTED Final   Pseudomonas aeruginosa NOT DETECTED NOT DETECTED Final   Stenotrophomonas maltophilia NOT DETECTED NOT DETECTED Final   Candida albicans NOT DETECTED NOT DETECTED Final   Candida auris NOT DETECTED NOT DETECTED Final   Candida glabrata NOT DETECTED NOT DETECTED Final   Candida krusei NOT DETECTED NOT DETECTED Final   Candida parapsilosis NOT DETECTED NOT DETECTED Final   Candida tropicalis NOT DETECTED NOT DETECTED Final   Cryptococcus neoformans/gattii NOT DETECTED NOT DETECTED Final   Methicillin resistance mecA/C NOT DETECTED NOT DETECTED Final    Comment: Performed at [facility], [address] [place], [place] [address]  Blood culture (routine x 2)     Status: None   Collection Time: 09/08/19 12:59 PM   Specimen: BLOOD  Result Value Ref Range Status   Specimen Description BLOOD LEFT UPPER FORARM  Final   Special Requests   Final    BOTTLES DRAWN AEROBIC AND ANAEROBIC Blood Culture results may not be optimal due to an excessive volume of blood received in culture bottles   Culture   Final    NO GROWTH 5 DAYS Performed at [facility], [address] [place], [place] [address]    Report Status 09/13/2019 FINAL  Final    Coagulation Studies: No results for input(s): LABPROT, INR in the last 72 hours.  Urinalysis: No results for input(s): COLORURINE, LABSPEC, PHURINE, GLUCOSEU, HGBUR, BILIRUBINUR, KETONESUR, PROTEINUR, UROBILINOGEN, NITRITE, LEUKOCYTESUR in the last 72 hours.  Invalid input(s): APPERANCEUR    Imaging: DG Chest Port 1 View  Result Date: 09/17/2019 CLINICAL DATA:  Pneumonia. EXAM: PORTABLE CHEST 1 VIEW COMPARISON:  September 14, 2019. FINDINGS: Stable cardiomegaly. Left internal jugular dialysis catheter is unchanged in position. No pneumothorax is noted. Stable bilateral perihilar and basilar opacities are  noted concerning for pulmonary edema. Small pleural effusions may be present. Bony thorax is unremarkable. IMPRESSION: Stable bilateral pulmonary edema. Electronically Signed   By: Marijo Conception M.D.   On: 09/17/2019 08:12     Medications:   . albumin human 12.5 g (09/17/19 0925)   . sodium chloride   Intravenous Once  . amoxicillin-clavulanate  1 tablet Oral Q12H  . Chlorhexidine Gluconate Cloth  6 each Topical Daily  . fludrocortisone  0.2 mg Oral BID  . hydrocortisone  sod succinate (SOLU-CORTEF) inj  100 mg Intravenous Q12H  . insulin aspart  0-15 Units Subcutaneous Q4H  . ipratropium-albuterol  3 mL Nebulization BID  . levothyroxine  175 mcg Oral Daily  . mouth rinse  15 mL Mouth Rinse BID  . midodrine  10 mg Oral TID WC  . nystatin cream   Topical BID  . pantoprazole (PROTONIX) IV  40 mg Intravenous Q24H  . senna  1 tablet Oral BID  . zinc oxide  1 application Topical Daily   acetaminophen **OR** acetaminophen, heparin sodium (porcine), HYDROmorphone (DILAUDID) injection, ondansetron **OR** ondansetron (ZOFRAN) IV, ondansetron (ZOFRAN) IV, polyethylene glycol  Assessment/ Plan:  Mr. Naomi Fitton is a 79 y.o. 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 Acute renal failure with hyperkalemia, LE edema, chronic kidney disease stage IIIB.  Baseline Creatinine 1.5  Nonoliguric urine output. 09/05 0701 - 09/06 0700 In: 44  [P.O.:120; IV Piggyback:110] Out: 200 [Urine:200]   - required renal replacement therapy. CVVHDF from 8/29 to 9/2 - IHD on 9/4 - Monitor daily for need of dialysis and  for renal recovery - Electrolytes and volume status are acceptable.  No acute indication for dialysis at present -We will add IV albumin for oncotic support  2. Hypotension: Required vasopressors during hospital stay- norepinephrine, cardiogenic and septic shock from pneumonia Continue midodrine Also getting fludrocortisone 0.2 mg twice a day  3.  Pneumonia Improved Patient has underlying pulmonary hypertension, obstructive sleep apnea and morbid obesity Currently on Augmentin      LOS: 9 Andrew Valentine 9/6/202111:03 AM

## 2019-09-18 DIAGNOSIS — R710 Precipitous drop in hematocrit: Secondary | ICD-10-CM

## 2019-09-18 LAB — CBC
HCT: 22.7 % — ABNORMAL LOW (ref 39.0–52.0)
Hemoglobin: 7.5 g/dL — ABNORMAL LOW (ref 13.0–17.0)
MCH: 32.1 pg (ref 26.0–34.0)
MCHC: 33 g/dL (ref 30.0–36.0)
MCV: 97 fL (ref 80.0–100.0)
Platelets: 122 10*3/uL — ABNORMAL LOW (ref 150–400)
RBC: 2.34 MIL/uL — ABNORMAL LOW (ref 4.22–5.81)
RDW: 18.4 % — ABNORMAL HIGH (ref 11.5–15.5)
WBC: 11.9 10*3/uL — ABNORMAL HIGH (ref 4.0–10.5)
nRBC: 0.3 % — ABNORMAL HIGH (ref 0.0–0.2)

## 2019-09-18 LAB — BASIC METABOLIC PANEL
Anion gap: 7 (ref 5–15)
BUN: 85 mg/dL — ABNORMAL HIGH (ref 8–23)
CO2: 29 mmol/L (ref 22–32)
Calcium: 8.4 mg/dL — ABNORMAL LOW (ref 8.9–10.3)
Chloride: 103 mmol/L (ref 98–111)
Creatinine, Ser: 3.06 mg/dL — ABNORMAL HIGH (ref 0.61–1.24)
GFR calc Af Amer: 21 mL/min — ABNORMAL LOW (ref >60)
GFR calc non Af Amer: 18 mL/min — ABNORMAL LOW (ref >60)
Glucose, Bld: 165 mg/dL — ABNORMAL HIGH (ref 70–99)
Potassium: 3.6 mmol/L (ref 3.5–5.1)
Sodium: 139 mmol/L (ref 135–145)

## 2019-09-18 LAB — GLUCOSE, CAPILLARY
Glucose-Capillary: 143 mg/dL — ABNORMAL HIGH (ref 70–99)
Glucose-Capillary: 156 mg/dL — ABNORMAL HIGH (ref 70–99)
Glucose-Capillary: 157 mg/dL — ABNORMAL HIGH (ref 70–99)
Glucose-Capillary: 189 mg/dL — ABNORMAL HIGH (ref 70–99)
Glucose-Capillary: 198 mg/dL — ABNORMAL HIGH (ref 70–99)
Glucose-Capillary: 205 mg/dL — ABNORMAL HIGH (ref 70–99)

## 2019-09-18 LAB — MAGNESIUM: Magnesium: 2 mg/dL (ref 1.7–2.4)

## 2019-09-18 LAB — PHOSPHORUS: Phosphorus: 3.8 mg/dL (ref 2.5–4.6)

## 2019-09-18 MED ORDER — PROSOURCE PLUS PO LIQD
30.0000 mL | Freq: Three times a day (TID) | ORAL | Status: DC
  Administered 2019-09-19 – 2019-09-28 (×17): 30 mL via ORAL
  Filled 2019-09-18 (×26): qty 30

## 2019-09-18 MED ORDER — ADULT MULTIVITAMIN W/MINERALS CH
1.0000 | ORAL_TABLET | Freq: Every day | ORAL | Status: DC
  Administered 2019-09-19 – 2019-09-28 (×9): 1 via ORAL
  Filled 2019-09-18 (×9): qty 1

## 2019-09-18 MED ORDER — NEPRO/CARBSTEADY PO LIQD
237.0000 mL | Freq: Three times a day (TID) | ORAL | Status: DC
  Administered 2019-09-18 – 2019-09-28 (×18): 237 mL via ORAL

## 2019-09-18 NOTE — Progress Notes (Signed)
Palliative NP met with patient. Pt was Alert and Oriented x 3. Pt expressed that he does not want to be intubated or have CPR done if he stops breathing or heart stops. Pt agreed to continue with medical interventions. Agreed to utilized CPAP machine at night for respiratory support. Palliative nurse reached out to daughter about patient's plans. Was informed that the son will be visiting [place]. Palliative nurse and patient left son a voicemail regarding plans.

## 2019-09-18 NOTE — Progress Notes (Signed)
Occupational Therapy Treatment Patient Details Name: Andrew Valentine MRN: [id number] DOB: [birth year] Today's Date: 09/18/2019    History of present illness Pt is a 79 year old male with a known history of morbid obesity, sleep apnea intolerant noncompliant to CPAP, hypertension, diabetes, hyperlipidemia, hypothyroidism, remote history of tobacco abuse comes to the emergency room with altered mental status and hypoxia.  Pt was on CRRT starting 09/09/19 now off CRRT with plan for PermCath placement 09/14/19.  MD assessment includes: Acute on chronic kidney failure, acute hypoxic respiratory failure, pulmonary HTN, anemia, and hypotension.   OT comments  Pt seen for OT treatment this date to f/u re: functional activity tolerance and safety with ADLs. Pt declines to attempt transition to EOB sitting this date despite encouragement and education re: importance of OOB activity especially related to pt's self-reported goal of being able to walk again. OT engages pt in below-listed exercises to improve strength and tolerance as it pertains to self care ADLs including self-feeding and ADL mobility. Pt requires setup and use of both UEs for hand to mouth with cup of water with lid and straw x2 during session to hydrate during rest breaks. OT educates re: correct form and pace as well as rationale. In addition, rationale provided for exercises chosen as they relate to functional tasks. Pt with good understanding. Will require continued follow up. SNF continues to be most appropriate d/c recommendation at this time.    Follow Up Recommendations  SNF    Equipment Recommendations  Other (comment) (defer to next level of care)    Recommendations for Other Services      Precautions / Restrictions Precautions Precautions: Fall Restrictions Weight Bearing Restrictions: No       Mobility Bed Mobility               General bed mobility comments: Pt declines to attempt to perform sup to sit this date despite  encouragement. Educated re: if his goal is to walk again, he will need to be getting OOB regularly.  Transfers                      Balance                                           ADL either performed or assessed with clinical judgement   ADL                                               Vision Patient Visual Report: No change from baseline     Perception     Praxis      Cognition Arousal/Alertness: Awake/alert Behavior During Therapy: WFL for tasks assessed/performed Overall Cognitive Status: Within Functional Limits for tasks assessed                                 General Comments: Pt more awake this date and more interactive. Approrpiately follows all cues.        Exercises Other Exercises Other Exercises: Pt declines to sit EOB at this time. OT facilitates pt participation in bed level therex including: 2 sets x15 reps elbow flex/ext, shld internal/external rotation, wrist rotation both toward  lateral and toward medial aspect, and digit flex/ext. 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 In addition, OT engages pt in 2 sets x15 reps LE internal rotation/squeeze and 1 sec hold and DF/PF. Pt tolerates well overall, requires 1-2 min RB between most exercises.   Shoulder Instructions       General Comments      Pertinent Vitals/ Pain       Pain Assessment: No/denies pain  Home Living                                          Prior Functioning/Environment              Frequency  Min 1X/week        Progress Toward Goals  OT Goals(current goals can now be found in the care plan section)  Progress towards OT goals: Progressing toward goals  Acute Rehab OT Goals Patient Stated Goal: To walk again OT Goal Formulation: With patient Time For Goal Achievement: 09/27/19 Potential to Achieve Goals: [place] Discharge plan remains appropriate     Co-evaluation                 AM-PAC OT "6 Clicks" Daily Activity     Outcome Measure   Help from another person eating meals?: A Lot Help from another person taking care of personal grooming?: A Lot Help from another person toileting, which includes using toliet, bedpan, or urinal?: Total Help from another person bathing (including washing, rinsing, drying)?: Total Help from another person to put on and taking off regular upper body clothing?: A Lot Help from another person to put on and taking off regular lower body clothing?: Total 6 Click Score: 9    End of Session    OT Visit Diagnosis: Unsteadiness on feet (R26.81);Other abnormalities of gait and mobility (R26.89);Repeated falls (R29.6);Muscle weakness (generalized) (M62.81)   Activity Tolerance Patient tolerated treatment well   Patient Left in bed;with call bell/phone within reach   Nurse Communication          Time: 2536-6440 OT Time Calculation (min): 23 min  Charges: OT General Charges $OT Visit: 1 Visit OT Treatments $Therapeutic Exercise: 23-37 mins  Gerrianne Scale, [place], OTR/L ascom [phone] 09/18/19, 9:56 AM

## 2019-09-18 NOTE — Progress Notes (Signed)
[facility]  ROUNDING NOTE   Subjective:   Patient resting in bed, in no acute distress, on 3L of O2 via nasal canula, denies SOB, reports consuming breakfast today, no nausea or vomiting. 09/06 0701 - 09/07 0700 In: 240 [P.O.:240] Out: 1400 [Urine:1400]    Objective:  Vital signs in last 24 hours:  Temp:  [97.6 F (36.4 C)-98.2 F (36.8 C)] 97.6 F (36.4 C) (09/07 0723) Pulse Rate:  [63-73] 73 (09/07 0723) Resp:  [16-17] 17 (09/07 0723) BP: (118-126)/(55-58) 119/55 (09/07 0723) SpO2:  [92 %-97 %] 95 % (09/07 0856) Weight:  [161.6 kg] 161.6 kg (09/07 0631)  Weight change:  Filed Weights   09/13/19 0437 09/14/19 1134 09/18/19 0631  Weight: (!) 163.3 kg (!) 163.3 kg (!) 161.6 kg    Intake/Output: I/O last 3 completed shifts: In: 350 [P.O.:240; IV Piggyback:110] Out: 1400 [Urine:1400]   Intake/Output this shift:  Total I/O In: 120 [P.O.:120] Out: -   Physical Exam: General: Resting in bed, in no acute distress  Head: Oral mucosal membranes,Nasal canula in place with 3L O2  Lungs:  Diminished at the bases  Heart:  S1S2, ,irregular  Abdomen:  Obese, non distended, non tender  Extremities: Trace  peripheral edema.  Neurologic:  Alert, able to answer simple questions appropriately  Skin: No new lesions or rashes  Access: LIJ temp HD catheter    Basic Metabolic Panel: Recent Labs  Lab 09/14/19 0312 09/14/19 0312 09/15/19 1414 09/15/19 1414 09/16/19 1106 09/17/19 0803 09/18/19 0449  NA 135  --  136  --  137 139 139  K 3.9  --  3.7  --  3.7 3.8 3.6  CL 102  --  102  --  102 105 103  CO2 27  --  30  --  28 29 29   GLUCOSE 149*  --  122*  --  180* 165* 165*  BUN 74*  --  60*  --  77* 81* 85*  CREATININE 2.23*  --  2.02*  --  2.44* 2.78* 3.06*  CALCIUM 8.3*   < > 7.7*   < > 8.1* 8.3* 8.4*  MG 1.9  --  1.8  --  1.8 2.1 2.0  PHOS 2.4*  2.5  --  3.5  --  3.6 4.4 3.8   < > = values in this interval not displayed.    Liver Function Tests: Recent  Labs  Lab 09/14/19 0312  ALBUMIN 2.3*   No results for input(s): LIPASE, AMYLASE in the last 168 hours. No results for input(s): AMMONIA in the last 168 hours.  CBC: Recent Labs  Lab 09/14/19 0312 09/15/19 1507 09/16/19 1106 09/17/19 0803 09/18/19 0449  WBC 11.0* 13.2* 14.3* 13.9* 11.9*  HGB 7.1* 6.9* 7.5* 7.7* 7.5*  HCT 22.7* 20.7* 23.4* 23.5* 22.7*  MCV 99.6 95.0 98.3 96.7 97.0  PLT 122* 112* 129* 126* 122*    Cardiac Enzymes: No results for input(s): CKTOTAL, CKMB, CKMBINDEX, TROPONINI in the last 168 hours.  BNP: Invalid input(s): POCBNP  CBG: Recent Labs  Lab 09/17/19 1605 09/17/19 2046 09/18/19 0026 09/18/19 0423 09/18/19 0721  GLUCAP 188* 178* 157* 143* 156*    Microbiology: Results for orders placed or performed during the hospital encounter of 09/08/19  MRSA PCR Screening     Status: None   Collection Time: 09/08/19 11:49 AM   Specimen: Nasopharyngeal  Result Value Ref Range Status   MRSA by PCR NEGATIVE NEGATIVE Final    Comment:  The GeneXpert MRSA Assay (FDA approved for NASAL specimens only), is one component of a comprehensive MRSA colonization surveillance program. It is not intended to diagnose MRSA infection nor to guide or monitor treatment for MRSA infections. Performed at [facility], [address] [place], [place] [address]   SARS Coronavirus 2 by RT PCR (hospital order, performed in [facility] hospital lab) Nasopharyngeal Nasopharyngeal Swab     Status: None   Collection Time: 09/08/19 12:05 PM   Specimen: Nasopharyngeal Swab  Result Value Ref Range Status   SARS Coronavirus 2 NEGATIVE NEGATIVE Final    Comment: (NOTE) SARS-CoV-2 target nucleic acids are NOT DETECTED.  The SARS-CoV-2 RNA is generally detectable in upper and lower respiratory specimens during the acute phase of infection. The lowest concentration of SARS-CoV-2 viral copies this assay can detect is 250 copies / mL. A negative result does not  preclude SARS-CoV-2 infection and should not be used as the sole basis for treatment or other patient management decisions.  A negative result may occur with improper specimen collection / handling, submission of specimen other than nasopharyngeal swab, presence of viral mutation(s) within the areas targeted by this assay, and inadequate number of viral copies (<250 copies / mL). A negative result must be combined with clinical observations, patient history, and epidemiological information.  Fact Sheet for Patients:   StrictlyIdeas.no  Fact Sheet for Healthcare Providers: BankingDealers.co.za  This test is not yet approved or  cleared by the [place] FDA and has been authorized for detection and/or diagnosis of SARS-CoV-2 by FDA under an Emergency Use Authorization (EUA).  This EUA will remain in effect (meaning this test can be used) for the duration of the COVID-19 declaration under Section 564(b)(1) of the Act, 21 U.S.C. section 360bbb-3(b)(1), unless the authorization is terminated or revoked sooner.  Performed at [facility], [address] [place], [place] [address]   Blood culture (routine x 2)     Status: Abnormal   Collection Time: 09/08/19 12:07 PM   Specimen: BLOOD  Result Value Ref Range Status   Specimen Description   Final    BLOOD L DISTAL FOREARM Performed at [facility], [address], [place], [place] [address]    Special Requests   Final    BOTTLES DRAWN AEROBIC AND ANAEROBIC Blood Culture results may not be optimal due to an excessive volume of blood received in culture bottles Performed at [facility], [address] [place], [place] [address]    Culture  Setup Time   Final    GRAM POSITIVE COCCI AEROBIC BOTTLE ONLY CRITICAL RESULT CALLED TO, READ BACK BY AND VERIFIED WITH: MORGAN CUNNINGHAM  AT 9833 ON 09/09/19 SNG    Culture (A)  Final    STAPHYLOCOCCUS  EPIDERMIDIS THE SIGNIFICANCE OF ISOLATING THIS ORGANISM FROM A SINGLE SET OF BLOOD CULTURES WHEN MULTIPLE SETS ARE DRAWN IS UNCERTAIN. PLEASE NOTIFY THE MICROBIOLOGY DEPARTMENT WITHIN ONE WEEK IF SPECIATION AND SENSITIVITIES ARE REQUIRED. Performed at [facility], [place] [address], [place], [place] [address]    Report Status 09/11/2019 FINAL  Final  Blood Culture ID Panel (Reflexed)     Status: Abnormal   Collection Time: 09/08/19 12:07 PM  Result Value Ref Range Status   Enterococcus faecalis NOT DETECTED NOT DETECTED Final   Enterococcus Faecium NOT DETECTED NOT DETECTED Final   Listeria monocytogenes NOT DETECTED NOT DETECTED Final   Staphylococcus species DETECTED (A) NOT DETECTED Final    Comment: CRITICAL RESULT  CALLED TO, READ BACK BY AND VERIFIED WITH: MORGAN CUNNINGHAM AT 7846 ON 09/09/19 SNG    Staphylococcus aureus (BCID) NOT DETECTED NOT DETECTED Final   Staphylococcus epidermidis DETECTED (A) NOT DETECTED Final    Comment: CRITICAL RESULT CALLED TO, READ BACK BY AND VERIFIED WITH: MORGAN CUNNINGHAM AT 1345 ON 09/09/19 SNG    Staphylococcus lugdunensis NOT DETECTED NOT DETECTED Final   Streptococcus species NOT DETECTED NOT DETECTED Final   Streptococcus agalactiae NOT DETECTED NOT DETECTED Final   Streptococcus pneumoniae NOT DETECTED NOT DETECTED Final   Streptococcus pyogenes NOT DETECTED NOT DETECTED Final   A.calcoaceticus-baumannii NOT DETECTED NOT DETECTED Final   Bacteroides fragilis NOT DETECTED NOT DETECTED Final   Enterobacterales NOT DETECTED NOT DETECTED Final   Enterobacter cloacae complex NOT DETECTED NOT DETECTED Final   Escherichia coli NOT DETECTED NOT DETECTED Final   Klebsiella aerogenes NOT DETECTED NOT DETECTED Final   Klebsiella oxytoca NOT DETECTED NOT DETECTED Final   Klebsiella pneumoniae NOT DETECTED NOT DETECTED Final   Proteus species NOT DETECTED NOT DETECTED Final   Salmonella species NOT DETECTED NOT DETECTED Final   Serratia  marcescens NOT DETECTED NOT DETECTED Final   Haemophilus influenzae NOT DETECTED NOT DETECTED Final   Neisseria meningitidis NOT DETECTED NOT DETECTED Final   Pseudomonas aeruginosa NOT DETECTED NOT DETECTED Final   Stenotrophomonas maltophilia NOT DETECTED NOT DETECTED Final   Candida albicans NOT DETECTED NOT DETECTED Final   Candida auris NOT DETECTED NOT DETECTED Final   Candida glabrata NOT DETECTED NOT DETECTED Final   Candida krusei NOT DETECTED NOT DETECTED Final   Candida parapsilosis NOT DETECTED NOT DETECTED Final   Candida tropicalis NOT DETECTED NOT DETECTED Final   Cryptococcus neoformans/gattii NOT DETECTED NOT DETECTED Final   Methicillin resistance mecA/C NOT DETECTED NOT DETECTED Final    Comment: Performed at [facility], [address] [place], [place] [address]  Blood culture (routine x 2)     Status: None   Collection Time: 09/08/19 12:59 PM   Specimen: BLOOD  Result Value Ref Range Status   Specimen Description BLOOD LEFT UPPER FORARM  Final   Special Requests   Final    BOTTLES DRAWN AEROBIC AND ANAEROBIC Blood Culture results may not be optimal due to an excessive volume of blood received in culture bottles   Culture   Final    NO GROWTH 5 DAYS Performed at [facility], [address] [place], [place] [address]    Report Status 09/13/2019 FINAL  Final    Coagulation Studies: No results for input(s): LABPROT, INR in the last 72 hours.  Urinalysis: No results for input(s): COLORURINE, LABSPEC, PHURINE, GLUCOSEU, HGBUR, BILIRUBINUR, KETONESUR, PROTEINUR, UROBILINOGEN, NITRITE, LEUKOCYTESUR in the last 72 hours.  Invalid input(s): APPERANCEUR    Imaging: DG Chest Port 1 View  Result Date: 09/17/2019 CLINICAL DATA:  Pneumonia. EXAM: PORTABLE CHEST 1 VIEW COMPARISON:  September 14, 2019. FINDINGS: Stable cardiomegaly. Left internal jugular dialysis catheter is unchanged in position. No pneumothorax is noted. Stable bilateral perihilar  and basilar opacities are noted concerning for pulmonary edema. Small pleural effusions may be present. Bony thorax is unremarkable. IMPRESSION: Stable bilateral pulmonary edema. Electronically Signed   By: Marijo Conception M.D.   On: 09/17/2019 08:12     Medications:   . albumin human 12.5 g (09/18/19 1023)   . sodium chloride   Intravenous Once  . amoxicillin-clavulanate  1 tablet Oral Q12H  . Chlorhexidine Gluconate Cloth  6 each Topical  Daily  . feeding supplement (NEPRO CARB STEADY)  237 mL Oral BID BM  . fludrocortisone  0.2 mg Oral BID  . hydrocortisone sod succinate (SOLU-CORTEF) inj  100 mg Intravenous Q12H  . insulin aspart  0-15 Units Subcutaneous Q4H  . ipratropium-albuterol  3 mL Nebulization BID  . levothyroxine  175 mcg Oral Daily  . mouth rinse  15 mL Mouth Rinse BID  . midodrine  10 mg Oral TID WC  . nystatin cream   Topical BID  . pantoprazole (PROTONIX) IV  40 mg Intravenous Q24H  . senna  1 tablet Oral BID  . zinc oxide  1 application Topical Daily   acetaminophen **OR** acetaminophen, heparin sodium (porcine), HYDROmorphone (DILAUDID) injection, ondansetron **OR** ondansetron (ZOFRAN) IV, ondansetron (ZOFRAN) IV, polyethylene glycol  Assessment/ Plan:  Mr. Andrew Valentine is a 79 y.o. 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) [N17.9] Acute hypoxemic respiratory failure (HCC) [J96.01] Hypotension due to hypovolemia [I95.89, E86.1]  # Acute renal failure And anasarca Chronic kidney disease stage IIIB.  Baseline Creatinine  1.5  Nonoliguric urine output. 09/06 0701 - 09/07 0700 In: 240 [P.O.:240] Out: 1400 [Urine:1400]   - Required renal replacement therapy. CVVHDF from 8/29 to 9/2 - IHD on 9/4 -No acute indication for dialysis today, volume status acceptable. -Urine output 1400 ml -Will continue IV albumin for 3rd spacing of fluid -Will assess daily for the need to dialyze  #Hypotension Required vasopressors during hospital stay- norepinephrine, cardiogenic and septic shock from pneumonia Bp readings within acceptable range Will continue midodrine And Fudrocortisone 0.2 mg twice a day  # Pneumonia Improved Patient has underlying pulmonary hypertension, obstructive sleep apnea and morbid obesity Currently on Augmentin      LOS: 10 Andrew Valentine 9/7/202111:11 AM

## 2019-09-18 NOTE — TOC Progression Note (Addendum)
Transition of Care [facility]) - Progression Note    Patient Details  Name: Lemont Sitzmann MRN: [id number] Date of Birth: 03-11-1940  Transition of Care [facility]) CM/SW Contact  Su Hilt, RN Phone Number: 09/18/2019, 1:50 PM  Clinical Narrative:   Spoke with the daughter Daneil Dan and reviewed the bed offers, she stated that she wants to go look at the facilities with her brother tomorrow when he comes into town, I explained that the bed offers may not be in place because of the facilities filling up and may not have a bed any longer, The patient has been fully vaccinated. Daneil Dan stated understanding. She stated to go ahead and accept the bed at [facility], I called Gerald Stabs at [facility] and he stated that they will have a bed on Thursday         Expected Discharge Plan and Services                                                 Social Determinants of Health (SDOH) Interventions    Readmission Risk Interventions No flowsheet data found.

## 2019-09-18 NOTE — Progress Notes (Addendum)
Daily Progress Note   Patient Name: Andrew Valentine       Date: 09/18/2019 DOB: [birth year]  Age: 79 y.o. MRN#: [id number] Attending Physician: Nolberto Hanlon, MD Primary Care Physician: [facility] [place] Date: 09/08/2019  Reason for Consultation/Follow-up: Establishing goals of care  Subjective: Patient is resting in bed. Patient is alert and oriented. He tells me his full name, that he is at [facility], his children's names, and information about his care.   He states he is eager to leave the hospital. He is not eager to go to SNF, but will go if it is neccessary. When discussing [place], he states " I want to continue to try." He states he is willing to continue dialysis at this time. Upon discussing code status, he re-states he would not want to be placed on a ventilator as he has previously advised. Upon discussing chest compressions, shocks, or a breathing tube (CPR) if his heart or breathing stopped or were in a lethal rhythm he states "if I'm going let me go." Further clarified he would want a natural, peaceful death. RN at bedside during conversation. Called to speak with his daughter to answer questions and review conversation details.RN  Ascom phone was placed on speaker phone. Daughter asked patient clarifying questions about ventilator support and CPR. Patient continues to indicate a DNR/DNI status. She recaps and says "ok".   Discussed speaking to her brother, patient's son as he will not arrive until tomorrow. She provided phone number which was called on speaker phone as well. Unable to reach son, and VM left. Called her back to tell her we could not reach him.   New MOST completed. Spoke with patient further. He states he would never want a feeding tube. He states  "I don't want a tube going in my nose." He also indicated that if he were sick enough to require a PEG, that this would mean he would not have an acceptable QOL, and he would not want a PEG. He states "no feeding tube."   CM spoke with daughter regarding disposition.   I completed a MOST form today and the signed original was placed in the chart. A photocopy was placed in the chart to be scanned into EMR. The patient outlined their wishes for the following treatment decisions:  Cardiopulmonary  Resuscitation: Do Not Attempt Resuscitation (DNR/No CPR)  Medical Interventions: Limited Additional Interventions: Use medical treatment, IV fluids and cardiac monitoring as indicated, DO NOT USE intubation or mechanical ventilation. May consider use of less invasive airway support such as BiPAP or CPAP. Also provide comfort measures. Transfer to the hospital if indicated. Avoid intensive care.   Antibiotics: Antibiotics if indicated  IV Fluids: IV fluids if indicated  Feeding Tube: No feeding tube      Length of Stay: 10  Current Medications: Scheduled Meds:  . sodium chloride   Intravenous Once  . amoxicillin-clavulanate  1 tablet Oral Q12H  . Chlorhexidine Gluconate Cloth  6 each Topical Daily  . feeding supplement (NEPRO CARB STEADY)  237 mL Oral BID BM  . fludrocortisone  0.2 mg Oral BID  . hydrocortisone sod succinate (SOLU-CORTEF) inj  100 mg Intravenous Q12H  . insulin aspart  0-15 Units Subcutaneous Q4H  . ipratropium-albuterol  3 mL Nebulization BID  . levothyroxine  175 mcg Oral Daily  . mouth rinse  15 mL Mouth Rinse BID  . midodrine  10 mg Oral TID WC  . nystatin cream   Topical BID  . pantoprazole (PROTONIX) IV  40 mg Intravenous Q24H  . senna  1 tablet Oral BID  . zinc oxide  1 application Topical Daily    Continuous Infusions: . albumin human 12.5 g (09/18/19 1023)    PRN Meds: acetaminophen **OR** acetaminophen, heparin sodium (porcine), HYDROmorphone (DILAUDID)  injection, ondansetron **OR** ondansetron (ZOFRAN) IV, ondansetron (ZOFRAN) IV, polyethylene glycol  Physical Exam Constitutional:      Comments: Confused  Pulmonary:     Effort: Pulmonary effort is normal.  Neurological:     Mental Status: He is alert.             Vital Signs: BP (!) 119/55 (BP Location: Right Arm)   Pulse 73   Temp 97.6 F (36.4 C) (Oral)   Resp 17   Ht 5\' 8"  (1.727 m)   Wt (!) 161.6 kg   SpO2 95%   BMI 54.16 kg/m  SpO2: SpO2: 95 % O2 Device: O2 Device: Nasal Cannula O2 Flow Rate: O2 Flow Rate (L/min): 3 L/min  Intake/output summary:   Intake/Output Summary (Last 24 hours) at 09/18/2019 1215 Last data filed at 09/18/2019 1009 Gross per 24 hour  Intake 120 ml  Output 750 ml  Net -630 ml   LBM: Last BM Date: 09/14/19 Baseline Weight: Weight: (!) 149.7 kg Most recent weight: Weight: (!) 161.6 kg       Palliative Assessment/Data:    Flowsheet Rows     Most Recent Value  Intake Tab  Referral Department Critical care  Unit at Time of Referral ICU  Date Notified 09/11/19  Palliative Care Type New Palliative care  Reason for referral Clarify Goals of Care  Date of Admission 09/08/19  # of days IP prior to Palliative referral 3  Clinical Assessment  Psychosocial & Spiritual Assessment  Palliative Care Outcomes      Patient Active Problem List   Diagnosis Date Noted  . Persistent atrial fibrillation ([place]) 09/17/2019  . Anemia 09/17/2019  . Pulmonary hypertension, unspecified ([place])   . Pressure injury of skin 09/09/2019  . Hyperkalemia   . NSTEMI (non-ST elevated myocardial infarction) ([place])   . Acute hypoxemic respiratory failure ([place]) 09/08/2019  . Acute renal failure ([place]) 09/08/2019  . Acute renal failure with tubular necrosis ([place])   . Hypoxia   . Hypotension due to hypovolemia   .  Atrial fibrillation [facility])     Palliative Care Assessment & Plan   Recommendations/Plan:  DNR/DNI. No feeding tube.   Continue to treat the  treatable.   Recommend palliative at D/C. Please call for further needs.    Code Status:    Code Status Orders  (From admission, onward)         Start     Ordered   09/13/19 1312  Limited resuscitation (code)  Continuous       Question Answer Comment  In the event of cardiac or respiratory ARREST: Initiate Code Blue, Call Rapid Response Yes   In the event of cardiac or respiratory ARREST: Perform CPR Yes   In the event of cardiac or respiratory ARREST: Perform Intubation/Mechanical Ventilation No   In the event of cardiac or respiratory ARREST: Use NIPPV/BiPAp only if indicated Yes   In the event of cardiac or respiratory ARREST: Administer ACLS medications if indicated Yes   In the event of cardiac or respiratory ARREST: Perform Defibrillation or Cardioversion if indicated Yes   Comments MOST form on chart.      09/13/19 1312        Code Status History    Date Active Date Inactive Code Status Order ID Comments User Context   09/12/2019 1623 09/13/2019 1312 DNR [id number]  Asencion Gowda, NP Inpatient   09/08/2019 1850 09/12/2019 1623 Full Code [id number]  Fritzi Mandes, MD ED   09/08/2019 1845 09/08/2019 1850 Full Code [id number]  Fritzi Mandes, MD ED   09/08/2019 1551 09/08/2019 1845 DNR [id number]  Fritzi Mandes, MD ED   Advance Care Planning Activity       Prognosis:   Poor overall  Care plan was discussed with CCM- NP  Thank you for allowing the Palliative Medicine Team to assist in the care of this patient.   Total Time 50 min Prolonged Time Billed  no      Greater than 50%  of this time was spent counseling and coordinating care related to the above assessment and plan.  Asencion Gowda, NP  Please contact Palliative Medicine Team phone at [phone] for questions and concerns.

## 2019-09-18 NOTE — TOC Progression Note (Signed)
Transition of Care [facility]) - Progression Note    Patient Details  Name: Andrew Valentine MRN: [id number] Date of Birth: 09-Oct-1940  [facility] CM/SW Contact  Su Hilt, RN Phone Number: 09/18/2019, 8:40 AM  Clinical Narrative:   Called and left a VM for the daughter Daneil Dan at [phone] requesting a call back and provided contact information         Expected Discharge Plan and Services                                                 Social Determinants of Health (SDOH) Interventions    Readmission Risk Interventions No flowsheet data found.

## 2019-09-18 NOTE — TOC Progression Note (Signed)
Transition of Care [facility]) - Progression Note    Patient Details  Name: Andrew Valentine MRN: [id number] Date of Birth: 1940/02/14  Transition of Care [facility]) CM/SW [place], RN Phone Number: 09/18/2019, 11:17 AM  Clinical Narrative:  Spoke with the daughter Daneil Dan and explained the process, She is agreeable to discuss Bed offers once she gets home she will call me back to review          Expected Discharge Plan and Services                                                 Social Determinants of Health (SDOH) Interventions    Readmission Risk Interventions No flowsheet data found.

## 2019-09-18 NOTE — Progress Notes (Addendum)
PROGRESS NOTE    Andrew Valentine  UDJ:[id number] DOB: August 23, 1940 DOA: 09/08/2019 PCP: [facility]    Brief Narrative:  [phone] hx of Morbid obesity BMI >50, CHF, DM, hypothoroidism, came from home due to dehydration and AKI.  He lives alone and cannot take care of himself. He was noted to be lethargic in ED with hypercapnia and hypoxemia. Daughter she states she came to check on him and blood glucose was 47, he was unable to get out of chair. He was weak could not walk, desaturating 70s.  He was encephalopathic at this time. Daughter lives next door. Was admitted to ICU initially.    SIGNIFICANT EVENTS: 09/10/19- patient is awake this am, he on levphed 25, hes on CRRT with dyasylate modifcation this am by renal team. S/p wound care eval, UOP has improved.   8/31- patient weaned off vasopressin, weaned levophed from 25>>10.  9/1 - patient was evaluated by PALS, he does not wish to have permanent dialysis.  Code status discussion in progress.  9/2- Plan to remove Left IJ HD catheter, trial of Lasix as per Nephrology 9/3- patient reports feeling well, he ate dinner states he feels well. S/p vascular eval for perm cath placement. 1 unit prbc transfustion ordered, heparin stopped due to borderline low h/h 9/4 medical therapy    Consultants:   Pulmonary, cardiology, nephrology, vascular surgery  Procedures: Lines/tubes  Antimicrobials:  Augmentin   Subjective: Has no complaints but appears little more sob for me.  No chest pain, patient is unable to tell me if he is more short of breath but he thinks this at baseline.  He is requiring not requiring more oxygen.  Had a good urine output.     Objective: Vitals:   09/18/19 0024 09/18/19 0631 09/18/19 0723 09/18/19 0856  BP: (!) 118/58  (!) 119/55   Pulse: 63  73   Resp: 17  17   Temp: 98.2 F (36.8 C)  97.6 F (36.4 C)   TempSrc: Oral  Oral   SpO2: 94%  92% 95%  Weight:  (!) 161.6 kg    Height:         Intake/Output Summary (Last 24 hours) at 09/18/2019 1525 Last data filed at 09/18/2019 1009 Gross per 24 hour  Intake 120 ml  Output 750 ml  Net -630 ml   Filed Weights   09/13/19 0437 09/14/19 1134 09/18/19 0631  Weight: (!) 163.3 kg (!) 163.3 kg (!) 161.6 kg    Examination: Sitting up in bed comfortable, calm, minimally appears to be short of breath Positive fine rales bilaterally anteriorly no wheezing RRR, S1-S2 no murmurs rubs Soft nt/nd +bs Positive edema bilaterally no cyanosis Mood and affect appropriate, awake and alert Skin warm and dry      Data Reviewed: I have personally reviewed following labs and imaging studies  CBC: Recent Labs  Lab 09/14/19 0312 09/15/19 1507 09/16/19 1106 09/17/19 0803 09/18/19 0449  WBC 11.0* 13.2* 14.3* 13.9* 11.9*  HGB 7.1* 6.9* 7.5* 7.7* 7.5*  HCT 22.7* 20.7* 23.4* 23.5* 22.7*  MCV 99.6 95.0 98.3 96.7 97.0  PLT 122* 112* 129* 126* 502*   Basic Metabolic Panel: Recent Labs  Lab 09/14/19 0312 09/15/19 1414 09/16/19 1106 09/17/19 0803 09/18/19 0449  NA 135 136 137 139 139  K 3.9 3.7 3.7 3.8 3.6  CL 102 102 102 105 103  CO2 27 30 28 29 29   GLUCOSE 149* 122* 180* 165* 165*  BUN 74* 60* 77* 81* 85*  CREATININE 2.23* 2.02* 2.44* 2.78* 3.06*  CALCIUM 8.3* 7.7* 8.1* 8.3* 8.4*  MG 1.9 1.8 1.8 2.1 2.0  PHOS 2.4*  2.5 3.5 3.6 4.4 3.8   GFR: Estimated Creatinine Clearance: 29.3 mL/min (A) (by C-G formula based on SCr of 3.06 mg/dL (H)). Liver Function Tests: Recent Labs  Lab 09/14/19 0312  ALBUMIN 2.3*   No results for input(s): LIPASE, AMYLASE in the last 168 hours. No results for input(s): AMMONIA in the last 168 hours. Coagulation Profile: No results for input(s): INR, PROTIME in the last 168 hours. Cardiac Enzymes: No results for input(s): CKTOTAL, CKMB, CKMBINDEX, TROPONINI in the last 168 hours. BNP (last 3 results) No results for input(s): PROBNP in the last 8760 hours. HbA1C: No results for input(s):  HGBA1C in the last 72 hours. CBG: Recent Labs  Lab 09/17/19 2046 09/18/19 0026 09/18/19 0423 09/18/19 0721 09/18/19 1146  GLUCAP 178* 157* 143* 156* 189*   Lipid Profile: No results for input(s): CHOL, HDL, LDLCALC, TRIG, CHOLHDL, LDLDIRECT in the last 72 hours. Thyroid Function Tests: No results for input(s): TSH, T4TOTAL, FREET4, T3FREE, THYROIDAB in the last 72 hours. Anemia Panel: No results for input(s): VITAMINB12, FOLATE, FERRITIN, TIBC, IRON, RETICCTPCT in the last 72 hours. Sepsis Labs: No results for input(s): PROCALCITON, LATICACIDVEN in the last 168 hours.  No results found for this or any previous visit (from the past 240 hour(s)).       Radiology Studies: DG Chest Port 1 View  Result Date: 09/17/2019 CLINICAL DATA:  Pneumonia. EXAM: PORTABLE CHEST 1 VIEW COMPARISON:  September 14, 2019. FINDINGS: Stable cardiomegaly. Left internal jugular dialysis catheter is unchanged in position. No pneumothorax is noted. Stable bilateral perihilar and basilar opacities are noted concerning for pulmonary edema. Small pleural effusions may be present. Bony thorax is unremarkable. IMPRESSION: Stable bilateral pulmonary edema. Electronically Signed   By: Marijo Conception M.D.   On: 09/17/2019 08:12        Scheduled Meds: . sodium chloride   Intravenous Once  . amoxicillin-clavulanate  1 tablet Oral Q12H  . Chlorhexidine Gluconate Cloth  6 each Topical Daily  . feeding supplement (NEPRO CARB STEADY)  237 mL Oral BID BM  . fludrocortisone  0.2 mg Oral BID  . hydrocortisone sod succinate (SOLU-CORTEF) inj  100 mg Intravenous Q12H  . insulin aspart  0-15 Units Subcutaneous Q4H  . ipratropium-albuterol  3 mL Nebulization BID  . levothyroxine  175 mcg Oral Daily  . mouth rinse  15 mL Mouth Rinse BID  . midodrine  10 mg Oral TID WC  . nystatin cream   Topical BID  . pantoprazole (PROTONIX) IV  40 mg Intravenous Q24H  . senna  1 tablet Oral BID  . zinc oxide  1 application Topical  Daily   Continuous Infusions: . albumin human 12.5 g (09/18/19 1023)    Assessment & Plan:   Active Problems:   Acute hypoxemic respiratory failure (HCC)   Acute renal failure (HCC)   Hyperkalemia   NSTEMI (non-ST elevated myocardial infarction) (HCC)   Pressure injury of skin   Pulmonary hypertension, unspecified (HCC)   Persistent atrial fibrillation (HCC)   Anemia   1.  Acute hypoxic respiratory failure-overall improved Transferred from ICU. Covid -8/29 Most likely due to OHS/chronic thoracic restriction with con commitment OSA RVSP elevated, has pulmonary htn found on echo. cxr 8/31 with right-sided infiltrate suggestive of aspiration pneumonia versus CAP 9/7-chest x-ray from 9/6 revealed stable bilateral pulmonary edema  CAP-was on azithromycin and  Rocephin initially and de-escalate to Augmentin.  Last dose should be today and will discontinue Continue keeping O2 sat above 92%   2.A/CKD stage IIIB Baseline creatinine reported baseline per nephrology's note is 1.5 Nonoliguric urine output Required renal placement therapy Dialysis per nephrology interactive with me with with the urine getting some of the fluid is renal function is a little bit worse yesterday was 2.4 today 2.71 did not plan to dialyze today that I am seeing based on 9/3-Status post insertion of temporary dialysis catheter to the left IJ  Hemodialysis on 9/4 9/7-had good urine output, 1400 cc.  Trying to keep him off of dialysis per nephrology.  No acute indication for dialysis today and volume status per nephrology is acceptable.  Given IV albumin yesterday and today for oncotic support. Creatinine has increased to 3.06 however having good urine output. Will be assessed daily for dialysis need  3.  Persistent  afib-rate relatively controlled Cardiology following  N no plans to restore to normal sinus  Was on heparin drip was discontinued due to anemia and drop in H&H.  Status post 1 unit packed red  blood cell on 9/3  His anemia may prevent long-term anticoagulation  EF 55 to 60%    4.Hypotensive -in icu. Was on vasopressors.  Blood pressure holding off pressors Continue on  midodrine Spoke to Dr. Keenan Bachelor via chat- ok to dc  Solu-Cortef (was started in icu b/c hypotesive).   5.Drop in H/H/anemia- etiology?  Was on heparin drip, drop in H&H happened, status post 1 unit packed red blood cell on 9/3 1 unit of packed red blood cells on 9/4 9/7-hemoglobin today is 7.5. Transfuse if less than 7. Stool occult has been ordered but still pending x2 days Hemoglobin 7.7 today, no need for transfusion, will continue to monitor  6.OSA- on CPAP here but does not use at home.       DVT prophylaxis: scd Code Status:partial Family Communication: tried calling son twice and phone was busy. phone [phone]  Status is: Inpatient  Remains inpatient appropriate because:Inpatient level of care appropriate due to severity of illness   Dispo: The patient is from: Home              Anticipated d/c is to: SNF-bed [facility] on thrus[facility].              Anticipated d/c date is: > 3 days              Patient currently is not medically stable to d/c.In AKI, getting IV albumin, monitoring urine output to see if needs dialysis, getting dialysis as needed.           LOS: 10 days   Time spent: 45 min > 50% on coc    Nolberto Hanlon, MD [facility] Pager [phone]  If 7PM-7AM, please contact night-coverage [email or web address] Password [facility] 09/18/2019, 3:25 PM

## 2019-09-18 NOTE — Progress Notes (Addendum)
Initial Nutrition Assessment  DOCUMENTATION CODES:   Morbid obesity  INTERVENTION:  Increase to Nepro Shake po TID between meals, each supplement provides 425 kcal and 19 grams protein.  Provide PROSource Plus po TID with meals, each supplement provides 100 kcal and 15 grams of protein.  Provide MVI po daily.  NUTRITION DIAGNOSIS:   Inadequate oral intake related to decreased appetite as evidenced by per patient/family report.  GOAL:   Patient will meet greater than or equal to 90% of their needs  MONITOR:   PO intake, Supplement acceptance, Labs, Weight trends, Skin, I & O's  REASON FOR ASSESSMENT:   LOS    ASSESSMENT:   79 year old male with PMHx of hypothyroidism, OHS, HTN, CHF, DM admitted with acute hypoxic respiratory failure, AKI on CKD stage 3, persistent A-fib.   8/29 CRRT initiated 8/30 placed on dysphagia 1 diet with thin liquids 9/2 CRRT stopped 9/4 intermittent HD 9/6 upgraded to dysphagia 3 with thin liquids  Noted per discussion with Palliative Medicine patient has stated he would never want a feeding tube.  Met with patient at bedside. He reports his appetite is fair. He reports he has been eating less at meals because he is weak and needs assistance with eating now. He reports sometimes by the time they come to assist he has lost his appetite. He reports he is eating about 25-50% of his meals. He does report he enjoys Nepro and is drinking 2 daily between meals. Patient is amenable to drinking Nepro TID and other oral nutrition supplements to help meet calorie/protein needs.  He reports his UBW is 318 lbs and he does not believe he has had any weight loss. Weight during admission has been 161-163 kg. Patient is currently 161.6 kg (356.2 lbs).  Medications reviewed and include: Augmentin, Solu-Cortef 100 mg Q12hrs IV, Novolog 0-15 units Q4hrs, levothyroxine, Protonix, senna 1 tablet BID, human albumin 12.5 grams daily IV.  Labs reviewed: CBG 143-189,  BUN 85, Creatinine 3.06.  Patient is at risk for malnutrition.  NUTRITION - FOCUSED PHYSICAL EXAM:    Most Recent Value  Orbital Region Mild depletion  Upper Arm Region No depletion  Thoracic and Lumbar Region No depletion  Buccal Region No depletion  Temple Region Mild depletion  Clavicle Bone Region No depletion  Clavicle and Acromion Bone Region No depletion  Scapular Bone Region Unable to assess  Dorsal Hand No depletion  Patellar Region Unable to assess  Anterior Thigh Region Unable to assess  Posterior Calf Region Unable to assess  Edema (RD Assessment) Moderate  Hair Reviewed  Eyes Reviewed  Mouth Reviewed  Skin Reviewed  [ecchymosis]  Nails Reviewed     Diet Order:   Diet Order            DIET DYS 3 Room service appropriate? Yes with Assist; Fluid consistency: Thin; Fluid restriction: 1200 mL Fluid  Diet effective now                EDUCATION NEEDS:   No education needs have been identified at this time  Skin:  Skin Assessment: Skin Integrity Issues: Skin Integrity Issues:: Stage II, Other (Comment) Stage II: buttocks (9cm x 3cm) Other: multiple skin tears  Last BM:  09/18/2019 - medium type 6  Height:   Ht Readings from Last 1 Encounters:  09/14/19 $RemoveB'5\' 8"'[id number]$  (1.727 m)   Weight:   Wt Readings from Last 1 Encounters:  09/18/19 (!) 161.6 kg   BMI:  Body mass index is  54.16 kg/m.  Estimated Nutritional Needs:   Kcal:  2800-3000  Protein:  150-160 grams  Fluid:  per MD  Jacklynn Barnacle, MS, RD, LDN Pager number available on Amion

## 2019-09-19 DIAGNOSIS — D500 Iron deficiency anemia secondary to blood loss (chronic): Secondary | ICD-10-CM

## 2019-09-19 DIAGNOSIS — I50811 Acute right heart failure: Secondary | ICD-10-CM

## 2019-09-19 DIAGNOSIS — D62 Acute posthemorrhagic anemia: Secondary | ICD-10-CM

## 2019-09-19 DIAGNOSIS — I5033 Acute on chronic diastolic (congestive) heart failure: Secondary | ICD-10-CM

## 2019-09-19 LAB — GLUCOSE, CAPILLARY
Glucose-Capillary: 158 mg/dL — ABNORMAL HIGH (ref 70–99)
Glucose-Capillary: 160 mg/dL — ABNORMAL HIGH (ref 70–99)
Glucose-Capillary: 166 mg/dL — ABNORMAL HIGH (ref 70–99)
Glucose-Capillary: 173 mg/dL — ABNORMAL HIGH (ref 70–99)
Glucose-Capillary: 193 mg/dL — ABNORMAL HIGH (ref 70–99)
Glucose-Capillary: 221 mg/dL — ABNORMAL HIGH (ref 70–99)

## 2019-09-19 LAB — PHOSPHORUS: Phosphorus: 4.2 mg/dL (ref 2.5–4.6)

## 2019-09-19 LAB — MAGNESIUM: Magnesium: 2.2 mg/dL (ref 1.7–2.4)

## 2019-09-19 LAB — CREATININE, SERUM
Creatinine, Ser: 3.08 mg/dL — ABNORMAL HIGH (ref 0.61–1.24)
GFR calc Af Amer: 21 mL/min — ABNORMAL LOW (ref >60)
GFR calc non Af Amer: 18 mL/min — ABNORMAL LOW (ref >60)

## 2019-09-19 LAB — OCCULT BLOOD X 1 CARD TO LAB, STOOL: Fecal Occult Bld: POSITIVE — AB

## 2019-09-19 MED ORDER — SODIUM CHLORIDE 0.9 % IV SOLN
80.0000 mg | Freq: Once | INTRAVENOUS | Status: AC
  Administered 2019-09-19: 18:00:00 80 mg via INTRAVENOUS
  Filled 2019-09-19: qty 80

## 2019-09-19 MED ORDER — IPRATROPIUM-ALBUTEROL 0.5-2.5 (3) MG/3ML IN SOLN: 3.0000 mL | Freq: Four times a day (QID) | RESPIRATORY_TRACT | Status: DC | PRN

## 2019-09-19 MED ORDER — PANTOPRAZOLE SODIUM 40 MG IV SOLR: 40.0000 mg | Freq: Two times a day (BID) | INTRAVENOUS | Status: DC

## 2019-09-19 MED ORDER — EPOETIN ALFA 40000 UNIT/ML IJ SOLN
20000.0000 [IU] | INTRAMUSCULAR | Status: DC
  Administered 2019-09-19 – 2019-09-26 (×2): 20000 [IU] via SUBCUTANEOUS
  Filled 2019-09-19 (×2): qty 1

## 2019-09-19 MED ORDER — SODIUM CHLORIDE 0.9 % IV SOLN: INTRAVENOUS | Status: DC

## 2019-09-19 MED ORDER — EPOETIN ALFA 40000 UNIT/ML IJ SOLN
20000.0000 [IU] | INTRAMUSCULAR | Status: DC
  Filled 2019-09-19: qty 1

## 2019-09-19 MED ORDER — SODIUM CHLORIDE 0.9 % IV SOLN
8.0000 mg/h | INTRAVENOUS | Status: DC
  Administered 2019-09-19 – 2019-09-21 (×4): 8 mg/h via INTRAVENOUS
  Filled 2019-09-19 (×3): qty 80

## 2019-09-19 NOTE — Progress Notes (Signed)
[facility]  ROUNDING NOTE   Subjective:   Patient is more alert and awake this morning, on 3L of O2 via nasal canula, denies SOB. He reports good appetite, denies nausea,vomiting.  09/07 0701 - 09/08 0700 In: 240 [P.O.:240] Out: 950 [Urine:950]  Has black stools today  Objective:  Vital signs in last 24 hours:  Temp:  [97.8 F (36.6 C)-98.5 F (36.9 C)] 98.5 F (36.9 C) (09/08 0727) Pulse Rate:  [68-81] 68 (09/08 0727) Resp:  [17-19] 19 (09/08 0727) BP: (116-126)/(56-67) 126/56 (09/08 0727) SpO2:  [94 %-100 %] 94 % (09/08 0727) Weight:  [159.9 kg] 159.9 kg (09/08 0637)  Weight change: -1.678 kg Filed Weights   09/14/19 1134 09/18/19 0631 09/19/19 0637  Weight: (!) 163.3 kg (!) 161.6 kg (!) 159.9 kg    Intake/Output: I/O last 3 completed shifts: In: 240 [P.O.:240] Out: 1700 [Urine:1700]   Intake/Output this shift:  Total I/O In: 120 [P.O.:120] Out: -   Physical Exam: General: Pleasant, in  no acute distress  Head: Oral mucosal membranes,Nasal canula in place with 3L O2  Lungs:  Normal and symmetrical respiratory effort, Lungs diminished at the bases  Heart:  S1S2, ,irregular  Abdomen:  Obese, soft, black tarry loose stools +  Extremities: Edema + on bilateral thighs and upper extremities  Neurologic:  Alert,able to answer questions appropriately  Skin: No new lesions or rashes  Access: LIJ temp HD catheter    Basic Metabolic Panel: Recent Labs  Lab 09/14/19 0312 09/14/19 0312 09/15/19 1414 09/15/19 1414 09/16/19 1106 09/17/19 0803 09/18/19 0449 09/19/19 0537  NA 135  --  136  --  137 139 139  --   K 3.9  --  3.7  --  3.7 3.8 3.6  --   CL 102  --  102  --  102 105 103  --   CO2 27  --  30  --  28 29 29   --   GLUCOSE 149*  --  122*  --  180* 165* 165*  --   BUN 74*  --  60*  --  77* 81* 85*  --   CREATININE 2.23*   < > 2.02*  --  2.44* 2.78* 3.06* 3.08*  CALCIUM 8.3*   < > 7.7*   < > 8.1* 8.3* 8.4*  --   MG 1.9   < > 1.8  --  1.8 2.1  2.0 2.2  PHOS 2.4*  2.5   < > 3.5  --  3.6 4.4 3.8 4.2   < > = values in this interval not displayed.    Liver Function Tests: Recent Labs  Lab 09/14/19 0312  ALBUMIN 2.3*   No results for input(s): LIPASE, AMYLASE in the last 168 hours. No results for input(s): AMMONIA in the last 168 hours.  CBC: Recent Labs  Lab 09/14/19 0312 09/15/19 1507 09/16/19 1106 09/17/19 0803 09/18/19 0449  WBC 11.0* 13.2* 14.3* 13.9* 11.9*  HGB 7.1* 6.9* 7.5* 7.7* 7.5*  HCT 22.7* 20.7* 23.4* 23.5* 22.7*  MCV 99.6 95.0 98.3 96.7 97.0  PLT 122* 112* 129* 126* 122*    Cardiac Enzymes: No results for input(s): CKTOTAL, CKMB, CKMBINDEX, TROPONINI in the last 168 hours.  BNP: Invalid input(s): POCBNP  CBG: Recent Labs  Lab 09/18/19 1542 09/18/19 2014 09/19/19 0011 09/19/19 0440 09/19/19 0728  GLUCAP 205* 198* 158* 160* 18*    Microbiology: Results for orders placed or performed during the hospital encounter of 09/08/19  MRSA PCR Screening  Status: None   Collection Time: 09/08/19 11:49 AM   Specimen: Nasopharyngeal  Result Value Ref Range Status   MRSA by PCR NEGATIVE NEGATIVE Final    Comment:        The GeneXpert MRSA Assay (FDA approved for NASAL specimens only), is one component of a comprehensive MRSA colonization surveillance program. It is not intended to diagnose MRSA infection nor to guide or monitor treatment for MRSA infections. Performed at [facility], [address] [place], [place] [address]   SARS Coronavirus 2 by RT PCR (hospital order, performed in [facility] hospital lab) Nasopharyngeal Nasopharyngeal Swab     Status: None   Collection Time: 09/08/19 12:05 PM   Specimen: Nasopharyngeal Swab  Result Value Ref Range Status   SARS Coronavirus 2 NEGATIVE NEGATIVE Final    Comment: (NOTE) SARS-CoV-2 target nucleic acids are NOT DETECTED.  The SARS-CoV-2 RNA is generally detectable in upper and lower respiratory specimens during the acute  phase of infection. The lowest concentration of SARS-CoV-2 viral copies this assay can detect is 250 copies / mL. A negative result does not preclude SARS-CoV-2 infection and should not be used as the sole basis for treatment or other patient management decisions.  A negative result may occur with improper specimen collection / handling, submission of specimen other than nasopharyngeal swab, presence of viral mutation(s) within the areas targeted by this assay, and inadequate number of viral copies (<250 copies / mL). A negative result must be combined with clinical observations, patient history, and epidemiological information.  Fact Sheet for Patients:   StrictlyIdeas.no  Fact Sheet for Healthcare Providers: BankingDealers.co.za  This test is not yet approved or  cleared by the [place] FDA and has been authorized for detection and/or diagnosis of SARS-CoV-2 by FDA under an Emergency Use Authorization (EUA).  This EUA will remain in effect (meaning this test can be used) for the duration of the COVID-19 declaration under Section 564(b)(1) of the Act, 21 U.S.C. section 360bbb-3(b)(1), unless the authorization is terminated or revoked sooner.  Performed at [facility], [address] [place], [place] [address]   Blood culture (routine x 2)     Status: Abnormal   Collection Time: 09/08/19 12:07 PM   Specimen: BLOOD  Result Value Ref Range Status   Specimen Description   Final    BLOOD L DISTAL FOREARM Performed at [facility], [address], [place], [place] [address]    Special Requests   Final    BOTTLES DRAWN AEROBIC AND ANAEROBIC Blood Culture results may not be optimal due to an excessive volume of blood received in culture bottles Performed at [facility], [address], [place], [place] [address]    Culture  Setup Time   Final    GRAM POSITIVE COCCI AEROBIC BOTTLE ONLY CRITICAL  RESULT CALLED TO, READ BACK BY AND VERIFIED WITH: MORGAN CUNNINGHAM  AT 6834 ON 09/09/19 SNG    Culture (A)  Final    STAPHYLOCOCCUS EPIDERMIDIS THE SIGNIFICANCE OF ISOLATING THIS ORGANISM FROM A SINGLE SET OF BLOOD CULTURES WHEN MULTIPLE SETS ARE DRAWN IS UNCERTAIN. PLEASE NOTIFY THE MICROBIOLOGY DEPARTMENT WITHIN ONE WEEK IF SPECIATION AND SENSITIVITIES ARE REQUIRED. Performed at [facility], [place] [address] [place],  [address]    Report Status 09/11/2019 FINAL  Final  Blood Culture ID Panel (Reflexed)     Status: Abnormal   Collection Time: 09/08/19 12:07 PM  Result Value Ref Range Status   Enterococcus faecalis  NOT DETECTED NOT DETECTED Final   Enterococcus Faecium NOT DETECTED NOT DETECTED Final   Listeria monocytogenes NOT DETECTED NOT DETECTED Final   Staphylococcus species DETECTED (A) NOT DETECTED Final    Comment: CRITICAL RESULT CALLED TO, READ BACK BY AND VERIFIED WITH: MORGAN CUNNINGHAM AT 1345 ON 09/09/19 SNG    Staphylococcus aureus (BCID) NOT DETECTED NOT DETECTED Final   Staphylococcus epidermidis DETECTED (A) NOT DETECTED Final    Comment: CRITICAL RESULT CALLED TO, READ BACK BY AND VERIFIED WITH: MORGAN CUNNINGHAM AT 1345 ON 09/09/19 SNG    Staphylococcus lugdunensis NOT DETECTED NOT DETECTED Final   Streptococcus species NOT DETECTED NOT DETECTED Final   Streptococcus agalactiae NOT DETECTED NOT DETECTED Final   Streptococcus pneumoniae NOT DETECTED NOT DETECTED Final   Streptococcus pyogenes NOT DETECTED NOT DETECTED Final   A.calcoaceticus-baumannii NOT DETECTED NOT DETECTED Final   Bacteroides fragilis NOT DETECTED NOT DETECTED Final   Enterobacterales NOT DETECTED NOT DETECTED Final   Enterobacter cloacae complex NOT DETECTED NOT DETECTED Final   Escherichia coli NOT DETECTED NOT DETECTED Final   Klebsiella aerogenes NOT DETECTED NOT DETECTED Final   Klebsiella oxytoca NOT DETECTED NOT DETECTED Final   Klebsiella pneumoniae NOT DETECTED NOT  DETECTED Final   Proteus species NOT DETECTED NOT DETECTED Final   Salmonella species NOT DETECTED NOT DETECTED Final   Serratia marcescens NOT DETECTED NOT DETECTED Final   Haemophilus influenzae NOT DETECTED NOT DETECTED Final   Neisseria meningitidis NOT DETECTED NOT DETECTED Final   Pseudomonas aeruginosa NOT DETECTED NOT DETECTED Final   Stenotrophomonas maltophilia NOT DETECTED NOT DETECTED Final   Candida albicans NOT DETECTED NOT DETECTED Final   Candida auris NOT DETECTED NOT DETECTED Final   Candida glabrata NOT DETECTED NOT DETECTED Final   Candida krusei NOT DETECTED NOT DETECTED Final   Candida parapsilosis NOT DETECTED NOT DETECTED Final   Candida tropicalis NOT DETECTED NOT DETECTED Final   Cryptococcus neoformans/gattii NOT DETECTED NOT DETECTED Final   Methicillin resistance mecA/C NOT DETECTED NOT DETECTED Final    Comment: Performed at [facility], [address] [place], [place] [address]  Blood culture (routine x 2)     Status: None   Collection Time: 09/08/19 12:59 PM   Specimen: BLOOD  Result Value Ref Range Status   Specimen Description BLOOD LEFT UPPER FORARM  Final   Special Requests   Final    BOTTLES DRAWN AEROBIC AND ANAEROBIC Blood Culture results may not be optimal due to an excessive volume of blood received in culture bottles   Culture   Final    NO GROWTH 5 DAYS Performed at [facility], [address] [place],  [address]    Report Status 09/13/2019 FINAL  Final    Coagulation Studies: No results for input(s): LABPROT, INR in the last 72 hours.  Urinalysis: No results for input(s): COLORURINE, LABSPEC, PHURINE, GLUCOSEU, HGBUR, BILIRUBINUR, KETONESUR, PROTEINUR, UROBILINOGEN, NITRITE, LEUKOCYTESUR in the last 72 hours.  Invalid input(s): APPERANCEUR    Imaging: No results found.   Medications:   . albumin human 12.5 g (09/19/19 1010)   . (feeding supplement) PROSource Plus  30 mL Oral TID WC  . sodium  chloride   Intravenous Once  . amoxicillin-clavulanate  1 tablet Oral Q12H  . Chlorhexidine Gluconate Cloth  6 each Topical Daily  . feeding supplement (NEPRO CARB STEADY)  237 mL Oral TID BM  . fludrocortisone  0.2 mg Oral BID  . hydrocortisone sod succinate (SOLU-CORTEF) inj  100 mg Intravenous Q12H  . insulin aspart  0-15 Units Subcutaneous Q4H  . levothyroxine  175 mcg Oral Daily  . mouth rinse  15 mL Mouth Rinse BID  . midodrine  10 mg Oral TID WC  . multivitamin with minerals  1 tablet Oral Daily  . nystatin cream   Topical BID  . pantoprazole (PROTONIX) IV  40 mg Intravenous Q24H  . senna  1 tablet Oral BID  . zinc oxide  1 application Topical Daily   acetaminophen **OR** acetaminophen, heparin sodium (porcine), HYDROmorphone (DILAUDID) injection, ipratropium-albuterol, ondansetron **OR** ondansetron (ZOFRAN) IV, ondansetron (ZOFRAN) IV, polyethylene glycol  Assessment/ Plan:  Mr. Andrew Valentine is a 79 y.o. 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  Baseline Creatinine 1.5  Nonoliguric urine output. 09/07 0701 - 09/08 0700 In: 240 [P.O.:240] Out: 950 [Urine:950]   - Required renal replacement therapy. CVVHDF from 8/29 to 9/2 - IHD on 9/4 -Urine output 950 ml - IV albumin for 3rd spacing of fluid -No need for dialysis today,will continue assessing  daily  #Hypotension Required vasopressors during hospital stay- norepinephrine, cardiogenic and septic shock from pneumonia  Will continue midodrine And Fudrocortisone 0.2 mg twice a day Will continue monitoring blood pressure, currently within acceptable range  # Pneumonia Improved Patient has underlying pulmonary hypertension, obstructive sleep apnea and morbid obesity Currently on Augmentin  #Anemia of chronic disease Hgb today is 7.5 Patient has black tarry stools and stool positive for occult blood.  Close monitoring EPO SQ weekly (Wednesday)       LOS: [place] 9/8/202111:09 AM

## 2019-09-19 NOTE — Progress Notes (Addendum)
Speech Language Pathology Treatment: Dysphagia  Patient Details Name: Andrew Valentine MRN: [id number] DOB: [birth year] Today's Date: 09/19/2019 Time: 1300-1340 SLP Time Calculation (min) (ACUTE ONLY): 40 min  Assessment / Plan / Recommendation Clinical Impression  Pt seen for ongoing assessment of swallowing; trials to evaluate toleration of recent update of diet to Dysphagia 3. He is verbally responsive and able to follow instructions w/ min cues. Son was present for part of the session. Pt is on RA; wbc wnl. Pt engaged w/ SLP but continued to note increased respiratory effort w/ any exertion including talking w/ SLP. D/t increased WOB, pt instructed to take rest breaks w/ all oral intake to lessen effects of exertion and to decrease risk for aspiration. Pt followed instructions w/ SLP. SLP was present during mealtime and noted pt was not sitting upright-- pt was adjusted upright in bed w/ full assist d/t weakness. After positioning pt was given trials of thin liquids, purees, and moistened solids. NSG reported pt had previous required full-assist for feeding needs, but has transitioned to self-feeding for the meal observed in this session. Pt demonstrated self-feeding abilities w/ set-up assist for postural positioning & tray w/ finger foods- still needed support w/ feeding. No overt clinical s/s of aspiration were noted w/ any consistency; respiratory status remained calm and unlabored, vocal quality clear b/t trials. Pt independently drank w/ cup using straw & demonstrated use of safe swallowing strategy of single, small sips. Pt also demonstrated safe swallow strategy of alternating solids & liquids. Oral phase appeared grossly [facility] for bolus management, mastication, and timely A-P transfer for swallowing w/ all trials including trials of Moistened Solid foods; rotary chew noted. Pt noted some difficulty w/ particulate consistencies (rice) which was noted in chart by SLP. Pt & son were educated on general  aspiration precautions and safe swallowing strategies. Pt reported reduced instances of choking & coughing. Pt agreed verbally to the need for following aspiration precautions-- SLP emphasized importance of sitting upright for all oral intake, and engaging in self-advocacy when relying on caretakers for positioning or other feeding needs. Education & handouts provided to Pt & NSG.   Recommend continuation of Dysphagia level 3 diet (mech soft) w/ gravies added to moisten foods; Thin liquids. Recommend general aspiration precautions including moistening foods and chewing solids well; taking rest breaks b/t bites to lessen any SOB/WOB during exertion of oral intake; Pills Whole in Puree for safer swallowing in bed; tray setup and positioning assistance for meals.  ST services will sign off at this time-- NSG can reconsult if any new needs arise. MD/NSG updated. Precautions posted at bedside.      HPI HPI: Pt is a 13M hx of Morbid obesity BMI >50, CHF, DM, hypothoroidism, came from home due to dehydration and AKI.  He lives alone and cannot take care of himself. He was noted to be lethargic in ED with hypercapnia and hypoxemia. Per Daughter, she stated pt had been feeling poorly for 1+ week w/ poor appetite. She came to check on him the morning of admit and blood glucose was 47, he was unable to get out of chair. He was weak could not walk, desaturating 70s.  He was encephalopathic at this time. Daughter lives next door.       SLP Plan  Discharge SLP treatment due to (comment);All goals met (All goals met)       Recommendations  Diet recommendations: Thin liquid;Dysphagia 3 (mechanical soft) Liquids provided via: Straw Medication Administration: Whole meds with puree Supervision:  Patient able to self feed;Intermittent supervision to cue for compensatory strategies Compensations: Minimize environmental distractions;Slow rate;Small sips/bites;Lingual sweep for clearance of pocketing;Follow solids  with liquid Postural Changes and/or Swallow Maneuvers: Seated upright 90 degrees;Upright 30-60 min after meal                Oral Care Recommendations: Oral care BID;Oral care before and after PO;Staff/trained caregiver to provide oral care Follow up Recommendations: None SLP Visit Diagnosis: Dysphagia, unspecified (R13.10) Plan: Discharge SLP treatment due to (comment);All goals met (All goals met)       GO                Quintella Baton 09/19/2019, 2:58 PM   The information in this patient note, response to treatment, and overall treatment plan developed has been reviewed and agreed upon by this clinician.  09/20/19,1:11 PM Orinda Kenner, MS, [place] Speech Language Pathologist Rehab Services [phone]

## 2019-09-19 NOTE — Progress Notes (Signed)
°PROGRESS NOTE ° ° ° °Andrew Valentine  MRN:[id number] DOB: 02/21/1940 DOA: 09/08/2019 °PCP: [facility]  ° °Chief complaint.  Altered mental status. ° °Brief Narrative:  °79M hx of Morbid obesity BMI >50, CHF, DM, hypothoroidism, came from home due to  AKI.  He lives alone and cannot take care of himself. He was noted to be lethargic in ED with hypercapnia and hypoxemia. °Daughter she states she came to check on him and blood glucose was 47, he was unable to get out of chair. He was weak could not walk, desaturating 70s.  He was encephalopathic at this time. Daughter lives next door. Was admitted to ICU initially. °Patient had a significant weight gain over the last 2 weeks.  He has anasarca.  He was placed on CRRT.  °09/10/19- patient is awake this am, he on levphed 25, hes on CRRT with dyasylate modifcation this am by renal team. S/p wound care eval, UOP has improved.   °8/31- patient weaned off vasopressin, weaned levophed from 25>>10.  °9/1 - patient was evaluated by PALS, he does not wish to have permanent dialysis.  Code status discussion in progress.  °9/2- Plan to remove Left IJ HD catheter, trial of Lasix as per Nephrology °9/3- patient reports feeling well, he ate dinner states he feels well. S/p vascular eval for perm cath placement. 1 unit prbc transfustion ordered, heparin stopped due to borderline low h/h °9/4 medical therapy ° °9/8.  Patient had a significant black stool, positive occult blood.  Change Protonix to 40 mg IV twice a day, GI consult.  He has been making large amount of urine, has not been dialyzed since 9/4. ° ° ° ° ° ° ° °Assessment & Plan: °  °Active Problems: °  Acute hypoxemic respiratory failure (HCC) °  Acute renal failure (HCC) °  Hyperkalemia °  NSTEMI (non-ST elevated myocardial infarction) (HCC) °  Pressure injury of skin °  Pulmonary hypertension, unspecified (HCC) °  Persistent atrial fibrillation (HCC) °  Anemia ° ° °#1.  Acute hypoxemic respite failure. °Reviewed  patient echocardiogram, patient has severe pulmonary hypertension, patient also has obesity hypoventilation syndrome and obstructive sleep apnea.  Patient also has aspiration pneumonia at the right side.  All these factors contribute to acute hypoxemic respite failure. °Condition has been much improved.  Currently patient is on 2 L oxygen. ° °2.  Acute kidney injury on chronic kidney disease stage IIIb.  Baseline creatinine 1.5.  Patient had a significant anasarca at the time of admission suggested right-sided congestive heart failure. °Renal function has been stable, last dialysis was 9/4.  Patient still have temporary dialysis catheter in place.  Followed by nephrology. ° °3.  Acute right sided congestive heart failure with anasarca and severe pulmonary hypertension. POA. °Volume status has improved.  Patient is making significant amount of urine. ° °4.  Acute blood loss anemia secondary to upper GI bleed. POA. °Spoke with patient son, patient has been having black stools for the last 3 to 4 days.  Hemoglobin dropped down to 7.5.  Change Protonix to 40 mg IV twice a day.  Obtain GI consult for EGD. ° °5.  Hypotension.   °Patient was on vasopressors in ICU.  Blood pressure has been stabilized since then.  But still on midodrine. ° °6.  Obstructive sleep apnea. °Continue CPAP while in hospital. ° ° °DVT prophylaxis: SCDs °Code Status: DNR °Family Communication: Discussed with patient son, all questions answered. °Disposition Plan:  °• Patient came from: Home            °•   Anticipated d/c place: SNF  Barriers to d/c OR conditions which need to be met to effect a safe d/c:   Consultants:   Nephrology and GI  Procedures: HD Antimicrobials: None  Subjective: Patient still has significant edema at the buttock.  Short of breath with exertion.  Currently on 2 L oxygen. He had a multiple soft black stools, no nausea vomiting.  No abdominal pain. No fever or chills. He is making large amount of urine, no  dysuria hematuria.   Objective: Vitals:   09/19/19 0009 09/19/19 0637 09/19/19 0727 09/19/19 1528  BP: 116/67  (!) 126/56 (!) 125/55  Pulse: 81  68 84  Resp: _0 Temp: 98.2 F (36.8 C)  98.5 F (36.9 C) 98.4 F (36.9 C)  TempSrc: Oral  Oral Oral  SpO2: 95%  94% 94%  Weight:  (!) 159.9 kg    Height:        Intake/Output Summary (Last 24 hours) at 09/19/2019 1546 Last data filed at 09/19/2019 0900 Gross per 24 hour  Intake 240 ml  Output 950 ml  Net -710 ml   Filed Weights   09/14/19 1134 09/18/19 0631 09/19/19 0637  Weight: (!) 163.3 kg (!) 161.6 kg (!) 159.9 kg    Examination:  General exam: Appears calm and comfortable  Respiratory system: Clear to auscultation. Respiratory effort normal. Cardiovascular system: Irregular. No JVD, murmurs, rubs, gallops or clicks. 1X pedal edema. Gastrointestinal system: Abdomen is nondistended, soft and nontender. No organomegaly or masses felt. Normal bowel sounds heard. Central nervous system: Alert and oriented x3. No focal neurological deficits. Extremities: Symmetric  Skin: No rashes, lesions or ulcers Psychiatry: Mood & affect appropriate.  Anasarca.    Data Reviewed: I have personally reviewed following labs and imaging studies  CBC: Recent Labs  Lab 09/14/19 0312 09/15/19 1507 09/16/19 1106 09/17/19 0803 09/18/19 0449  WBC 11.0* 13.2* 14.3* 13.9* 11.9*  HGB 7.1* 6.9* 7.5* 7.7* 7.5*  HCT 22.7* 20.7* 23.4* 23.5* 22.7*  MCV 99.6 95.0 98.3 96.7 97.0  PLT 122* 112* 129* 126* 264*   Basic Metabolic Panel: Recent Labs  Lab 09/14/19 0312 09/14/19 0312 09/15/19 1414 09/16/19 1106 09/17/19 0803 09/18/19 0449 09/19/19 0537  NA 135  --  136 137 139 139  --   K 3.9  --  3.7 3.7 3.8 3.6  --   CL 102  --  102 102 105 103  --   CO2 27  --  _1 --   GLUCOSE 149*  --  122* 180* 165* 165*  --   BUN 74*  --  60* 77* 81* 85*  --   CREATININE 2.23*   < > 2.02* 2.44* 2.78* 3.06* 3.08*  CALCIUM 8.3*  --  7.7*  8.1* 8.3* 8.4*  --   MG 1.9   < > 1.8 1.8 2.1 2.0 2.2  PHOS 2.4*   2.5   < > 3.5 3.6 4.4 3.8 4.2   < > = values in this interval not displayed.   GFR: Estimated Creatinine Clearance: 28.9 mL/min (A) (by C-G formula based on SCr of 3.08 mg/dL (H)). Liver Function Tests: Recent Labs  Lab 09/14/19 0312  ALBUMIN 2.3*   No results for input(s): LIPASE, AMYLASE in the last 168 hours. No results for input(s): AMMONIA in the last 168 hours. Coagulation Profile: No results for input(s): INR, PROTIME in the last 168 hours. Cardiac Enzymes: No results for input(s): CKTOTAL, CKMB, CKMBINDEX, TROPONINI in  the last 168 hours. BNP (last 3 results) No results for input(s): PROBNP in the last 8760 hours. HbA1C: No results for input(s): HGBA1C in the last 72 hours. CBG: Recent Labs  Lab 09/18/19 2014 09/19/19 0011 09/19/19 0440 09/19/19 0728 09/19/19 1158  GLUCAP 198* 158* 160* 166* 173*   Lipid Profile: No results for input(s): CHOL, HDL, LDLCALC, TRIG, CHOLHDL, LDLDIRECT in the last 72 hours. Thyroid Function Tests: No results for input(s): TSH, T4TOTAL, FREET4, T3FREE, THYROIDAB in the last 72 hours. Anemia Panel: No results for input(s): VITAMINB12, FOLATE, FERRITIN, TIBC, IRON, RETICCTPCT in the last 72 hours. Sepsis Labs: No results for input(s): PROCALCITON, LATICACIDVEN in the last 168 hours.  No results found for this or any previous visit (from the past 240 hour(s)).       Radiology Studies: No results found.      Scheduled Meds:  (feeding supplement) PROSource Plus  30 mL Oral TID WC   sodium chloride   Intravenous Once   Chlorhexidine Gluconate Cloth  6 each Topical Daily   epoetin (EPOGEN/PROCRIT) injection  20,000 Units Subcutaneous Weekly   feeding supplement (NEPRO CARB STEADY)  237 mL Oral TID BM   fludrocortisone  0.2 mg Oral BID   insulin aspart  0-15 Units Subcutaneous Q4H   levothyroxine  175 mcg Oral Daily   mouth rinse  15 mL Mouth Rinse  BID   midodrine  10 mg Oral TID WC   multivitamin with minerals  1 tablet Oral Daily   nystatin cream   Topical BID   pantoprazole (PROTONIX) IV  40 mg Intravenous Q12H   senna  1 tablet Oral BID   zinc oxide  1 application Topical Daily   Continuous Infusions:  albumin human 12.5 g (09/19/19 1010)     LOS: 11 days    Time spent: 38 minutes    Sharen Hones, MD [facility]   To contact the attending provider between 7A-7P or the covering provider during after hours 7P-7A, please log into the web site [email or web address] and access using universal  password for that web site. If you do not have the password, please call the hospital operator.  09/19/2019, 3:46 PM

## 2019-09-19 NOTE — Consult Note (Signed)
Cephas Darby, MD [address]  [place]  [place], [place] [address]  Main: [phone]  Fax: [phone] Pager: [phone]   Consultation  Referring Provider:     No ref. provider found Primary Care Physician:  [facility] Primary Gastroenterologist:   Althia Forts       Reason for Consultation:     Worsening anemia, melena  Date of Admission:  09/08/2019 Date of Consultation:  09/19/2019         HPI:   Hosam Mcfetridge is a 79 y.o. male with morbid obesity, BMI greater than 50, obstructive sleep apnea, chronic kidney disease, metabolic syndrome who was a initially admitted on 09/08/2019 secondary to hypercapnic, hypoxic respiratory failure, acute renal failure, was in ICU, on pressors, CRRT.  AKI resolved and patient is not dependent on CRRT.  He also had history of A. fib with RVR, which is currently rate controlled, he was on heparin for A. fib which is now held due to downtrending hemoglobin.  He is also diagnosed with pulmonary hypertension secondary to OSA, obesity hypoventilation syndrome.  GI is consulted due to worsening anemia and dark stools for last 3 to 4 days.  His anemia hemoglobin on admission was 11.2 with MCV 104, subsequently dropped to 6.9 on 9/4, received blood transfusion, currently at 7.5 today. Patient does have mild iron deficiency and B12 deficiency. Patient denies abdominal pain, nausea or vomiting.  His son who is an ICU nurse is at bedside.  Patient does report taking Aleve for his left knee pain intermittently. He is currently on Protonix 40 mg IV twice daily.  Patient is otherwise hemodynamically stable since transfer out of ICU  NSAIDs: Aleve for left knee pain  Antiplts/Anticoagulants/Anti thrombotics: None  GI Procedures: Colonoscopy several years ago for cancer screening  Past Medical History:  Diagnosis Date  . Congestive heart failure (CHF) ([place])   . Hypertension   . Hypothyroidism   . Morbid obesity ([place])   . Obesity  hypoventilation syndrome ([place])   . Type 2 diabetes mellitus ([place])     Past Surgical History:  Procedure Laterality Date  . DIALYSIS/PERMA CATHETER INSERTION N/A 09/14/2019   Procedure: DIALYSIS/PERMA CATHETER INSERTION;  Surgeon: Katha Cabal, MD;  Location: [facility];  Service: Cardiovascular;  Laterality: N/A;  . none      Prior to Admission medications   Medication Sig Start Date End Date Taking? Authorizing Provider  atenolol (TENORMIN) 100 MG tablet Take 100 mg by mouth daily. 08/16/19   [provider]  atorvastatin (LIPITOR) 40 MG tablet Take 40 mg by mouth daily. 08/16/19   [provider]  glipiZIDE (GLUCOTROL) 5 MG tablet Take 5 mg by mouth daily. 08/16/19   [provider]  hydrochlorothiazide (HYDRODIURIL) 25 MG tablet Take 25 mg by mouth daily. 08/16/19   [provider]  levothyroxine (SYNTHROID) 175 MCG tablet Take 175 mcg by mouth daily. 08/16/19   [provider]  lisinopril (ZESTRIL) 5 MG tablet Take 5 mg by mouth daily. 08/16/19   [provider]   Current Facility-Administered Medications:  .  (feeding supplement) PROSource Plus liquid 30 mL, 30 mL, Oral, TID WC, Amery, Sahar, MD, 30 mL at 09/19/19 1622 .  0.9 %  sodium chloride infusion (Manually program via Guardrails IV Fluids), , Intravenous, Once, Ottie Glazier, MD .  acetaminophen (TYLENOL) tablet 650 mg, 650 mg, Oral, Q6H PRN **OR** acetaminophen (TYLENOL) suppository 650 mg, 650 mg, Rectal, Q6H PRN, Posey Pronto,  Sona, MD .  albumin human 25 % solution 12.5 g, 12.5 g, Intravenous, Daily, Candiss Norse, Harmeet, MD, Last Rate: 60 mL/hr at 09/19/19 1010, 12.5 g at 09/19/19 1010 .  Chlorhexidine Gluconate Cloth 2 % PADS 6 each, 6 each, Topical, Daily, Ottie Glazier, MD, 6 each at 09/19/19 0509 .  epoetin alfa (EPOGEN) injection 20,000 Units, 20,000 Units, Subcutaneous, Weekly, Sharen Hones, MD, 20,000 Units at 09/19/19 1624 .  feeding supplement (NEPRO CARB STEADY)  liquid 237 mL, 237 mL, Oral, TID BM, Amery, Sahar, MD, 237 mL at 09/19/19 1013 .  fludrocortisone (FLORINEF) tablet 0.2 mg, 0.2 mg, Oral, BID, Aleskerov, Fuad, MD, 0.2 mg at 09/19/19 1005 .  heparin sodium (porcine) injection 1,000-6,000 Units, 1,000-6,000 Units, Intracatheter, PRN, Fritzi Mandes, MD .  HYDROmorphone (DILAUDID) injection 1 mg, 1 mg, Intravenous, Once PRN, Stegmayer, Kimberly A, PA-C .  insulin aspart (novoLOG) injection 0-15 Units, 0-15 Units, Subcutaneous, Q4H, Ottie Glazier, MD, 5 Units at 09/19/19 1622 .  ipratropium-albuterol (DUONEB) 0.5-2.5 (3) MG/3ML nebulizer solution 3 mL, 3 mL, Nebulization, Q6H PRN, Sharen Hones, MD .  levothyroxine (SYNTHROID) tablet 175 mcg, 175 mcg, Oral, Daily, Fritzi Mandes, MD, 175 mcg at 09/19/19 0509 .  MEDLINE mouth rinse, 15 mL, Mouth Rinse, BID, Aleskerov, Fuad, MD, 15 mL at 09/19/19 1030 .  midodrine (PROAMATINE) tablet 10 mg, 10 mg, Oral, TID WC, Aleskerov, Fuad, MD, 10 mg at 09/19/19 1623 .  multivitamin with minerals tablet 1 tablet, 1 tablet, Oral, Daily, Nolberto Hanlon, MD, 1 tablet at 09/19/19 1005 .  nystatin cream (MYCOSTATIN), , Topical, BID, Ottie Glazier, MD, Given at 09/19/19 1013 .  ondansetron (ZOFRAN) tablet 4 mg, 4 mg, Oral, Q6H PRN **OR** ondansetron (ZOFRAN) injection 4 mg, 4 mg, Intravenous, Q6H PRN, Fritzi Mandes, MD .  ondansetron (ZOFRAN) injection 4 mg, 4 mg, Intravenous, Q6H PRN, Stegmayer, Kimberly A, PA-C .  pantoprazole (PROTONIX) 80 mg in sodium chloride 0.9 % 100 mL (0.8 mg/mL) infusion, 8 mg/hr, Intravenous, Continuous, Maccoy Haubner, Tally Due, MD .  pantoprazole (PROTONIX) 80 mg in sodium chloride 0.9 % 100 mL IVPB, 80 mg, Intravenous, Once, Gavin Telford, Tally Due, MD .  Derrill Memo ON 09/23/2019] pantoprazole (PROTONIX) injection 40 mg, 40 mg, Intravenous, Q12H, Theodus Ran, Tally Due, MD .  polyethylene glycol (MIRALAX / GLYCOLAX) packet 17 g, 17 g, Oral, Daily PRN, Fritzi Mandes, MD .  senna (SENOKOT) tablet 8.6 mg, 1 tablet, Oral,  BID, Fritzi Mandes, MD, 8.6 mg at 09/19/19 1005 .  zinc oxide (BALMEX) 97.6 % cream 1 application, 1 application, Topical, Daily, Ottie Glazier, MD, 1 application at 73/41/93 1013   Family History  Family history unknown: Yes     Social History   Tobacco Use  . Smoking status: Former Research scientist (life sciences)  . Smokeless tobacco: Never Used  Substance Use Topics  . Alcohol use: Not Currently  . Drug use: Never    Allergies as of 09/08/2019 - Review Complete 09/08/2019  Allergen Reaction Noted  . Penicillins Rash 09/08/2019    Review of Systems:    All systems reviewed and negative except where noted in HPI.   Physical Exam:  Vital signs in last 24 hours: Temp:  [98.2 F (36.8 C)-98.5 F (36.9 C)] 98.4 F (36.9 C) (09/08 1528) Pulse Rate:  [68-84] 84 (09/08 1528) Resp:  [17-19] 18 (09/08 1528) BP: (116-126)/(55-67) 125/55 (09/08 1528) SpO2:  [94 %-95 %] 94 % (09/08 1528) Weight:  [159.9 kg] 159.9 kg (09/08 0637) Last BM Date: 09/18/19 General: Morbidly obese, Pleasant, cooperative in NAD  Head:  Normocephalic and atraumatic. Eyes:   No icterus.   Conjunctiva pale. PERRLA. Ears:  Normal auditory acuity. Neck:  Supple; no masses or thyroidomegaly Lungs: Respirations even and unlabored.  Shallow respirations, lungs clear to auscultation bilaterally.   No wheezes, crackles, or rhonchi.  Heart: Irregular rate and rhythm;  Without murmur, clicks, rubs or gallops Abdomen:  Soft, obese, nondistended, nontender. Normal bowel sounds. No appreciable masses or hepatomegaly.  No rebound or guarding.  Rectal:  Not performed. Msk:  Symmetrical without gross deformities.  Strength generalized weakness Extremities: 2+ edema, no cyanosis or clubbing. Neurologic:  Alert and oriented x3;  grossly normal neurologically. Skin:  Intact without significant lesions or rashes. Psych:  Alert and cooperative. Normal affect.  LAB RESULTS: CBC Latest Ref Rng & Units 09/18/2019 09/17/2019 09/16/2019  WBC 4.0 - 10.5  K/uL 11.9(H) 13.9(H) 14.3(H)  Hemoglobin 13.0 - 17.0 g/dL 7.5(L) 7.7(L) 7.5(L)  Hematocrit 39 - 52 % 22.7(L) 23.5(L) 23.4(L)  Platelets 150 - 400 K/uL 122(L) 126(L) 129(L)    BMET BMP Latest Ref Rng & Units 09/19/2019 09/18/2019 09/17/2019  Glucose 70 - 99 mg/dL - 165(H) 165(H)  BUN 8 - 23 mg/dL - 85(H) 81(H)  Creatinine 0.61 - 1.24 mg/dL 3.08(H) 3.06(H) 2.78(H)  Sodium 135 - 145 mmol/L - 139 139  Potassium 3.5 - 5.1 mmol/L - 3.6 3.8  Chloride 98 - 111 mmol/L - 103 105  CO2 22 - 32 mmol/L - 29 29  Calcium 8.9 - 10.3 mg/dL - 8.4(L) 8.3(L)    LFT Hepatic Function Latest Ref Rng & Units 09/14/2019 09/09/2019 09/08/2019  Total Protein 6.5 - 8.1 g/dL - 7.2 7.7  Albumin 3.5 - 5.0 g/dL 2.3(L) 2.7(L) 3.0(L)  AST 15 - 41 U/L - 48(H) 25  ALT 0 - 44 U/L - 19 16  Alk Phosphatase 38 - 126 U/L - 88 89  Total Bilirubin 0.3 - 1.2 mg/dL - 1.3(H) 1.5(H)     STUDIES: No results found.    Impression / Plan:   Donyel Nester is a 79 y.o. male with morbid obesity, obesity hypoventilation syndrome, OSA, metabolic syndrome, CKD who is admitted with hypercapnic respiratory failure, AKI, A. fib with RVR, found to have pulmonary hypertension and right heart failure, needing CRRT, hospital course complicated by worsening anemia in setting of anticoagulation for A. fib.  Currently, anticoagulation has been held since 9/3 secondary to worsening anemia.  Patient is also noted to have dark bowel movements for last 3 to 4 days  Worsening anemia,?  Melena: History of NSAID use Patient is hemodynamically stable Recommend pantoprazole drip Plan for upper endoscopy tomorrow, if negative, will proceed with colonoscopy +/- video capsule endoscopy N.p.o. effective breakfast tomorrow Monitor CBC to maintain hemoglobin greater than 7  I have discussed alternative options, risks & benefits,  which include, but are not limited to, bleeding, infection, perforation,respiratory complication & drug reaction.  The patient agrees with  this plan & written consent will be obtained.     Thank you for involving me in the care of this patient.      LOS: 11 days   Sherri Sear, MD  09/19/2019, 4:36 PM   Note: This dictation was prepared with Dragon dictation along with smaller phrase technology. Any transcriptional errors that result from this process are unintentional.

## 2019-09-20 ENCOUNTER — Encounter
Admission: EM | Disposition: A | Discharge status: Hospice | Payer: Self-pay | Source: Home / Self Care | Attending: Internal Medicine

## 2019-09-20 ENCOUNTER — Inpatient Hospital Stay: Payer: Medicare Other | Admitting: Anesthesiology

## 2019-09-20 ENCOUNTER — Encounter: Payer: Self-pay | Admitting: Internal Medicine

## 2019-09-20 HISTORY — PX: ESOPHAGOGASTRODUODENOSCOPY (EGD) WITH PROPOFOL: SHX5813

## 2019-09-20 LAB — CBC WITH DIFFERENTIAL/PLATELET
Abs Immature Granulocytes: 0.05 10*3/uL (ref 0.00–0.07)
Basophils Absolute: 0 10*3/uL (ref 0.0–0.1)
Basophils Relative: 0 %
Eosinophils Absolute: 0.5 10*3/uL (ref 0.0–0.5)
Eosinophils Relative: 5 %
HCT: 23.1 % — ABNORMAL LOW (ref 39.0–52.0)
Hemoglobin: 7 g/dL — ABNORMAL LOW (ref 13.0–17.0)
Immature Granulocytes: 1 %
Lymphocytes Relative: 11 %
Lymphs Abs: 1 10*3/uL (ref 0.7–4.0)
MCH: 30.8 pg (ref 26.0–34.0)
MCHC: 30.3 g/dL (ref 30.0–36.0)
MCV: 101.8 fL — ABNORMAL HIGH (ref 80.0–100.0)
Monocytes Absolute: 0.7 10*3/uL (ref 0.1–1.0)
Monocytes Relative: 8 %
Neutro Abs: 6.9 10*3/uL (ref 1.7–7.7)
Neutrophils Relative %: 75 %
Platelets: 109 10*3/uL — ABNORMAL LOW (ref 150–400)
RBC: 2.27 MIL/uL — ABNORMAL LOW (ref 4.22–5.81)
RDW: 18.2 % — ABNORMAL HIGH (ref 11.5–15.5)
WBC: 9.2 10*3/uL (ref 4.0–10.5)
nRBC: 0 % (ref 0.0–0.2)

## 2019-09-20 LAB — BASIC METABOLIC PANEL
Anion gap: 8 (ref 5–15)
BUN: 88 mg/dL — ABNORMAL HIGH (ref 8–23)
CO2: 30 mmol/L (ref 22–32)
Calcium: 8.4 mg/dL — ABNORMAL LOW (ref 8.9–10.3)
Chloride: 104 mmol/L (ref 98–111)
Creatinine, Ser: 3.07 mg/dL — ABNORMAL HIGH (ref 0.61–1.24)
GFR calc Af Amer: 21 mL/min — ABNORMAL LOW (ref >60)
GFR calc non Af Amer: 18 mL/min — ABNORMAL LOW (ref >60)
Glucose, Bld: 132 mg/dL — ABNORMAL HIGH (ref 70–99)
Potassium: 3.4 mmol/L — ABNORMAL LOW (ref 3.5–5.1)
Sodium: 142 mmol/L (ref 135–145)

## 2019-09-20 LAB — GLUCOSE, CAPILLARY
Glucose-Capillary: 117 mg/dL — ABNORMAL HIGH (ref 70–99)
Glucose-Capillary: 122 mg/dL — ABNORMAL HIGH (ref 70–99)
Glucose-Capillary: 126 mg/dL — ABNORMAL HIGH (ref 70–99)
Glucose-Capillary: 129 mg/dL — ABNORMAL HIGH (ref 70–99)
Glucose-Capillary: 138 mg/dL — ABNORMAL HIGH (ref 70–99)
Glucose-Capillary: 160 mg/dL — ABNORMAL HIGH (ref 70–99)
Glucose-Capillary: 202 mg/dL — ABNORMAL HIGH (ref 70–99)
Glucose-Capillary: 214 mg/dL — ABNORMAL HIGH (ref 70–99)

## 2019-09-20 LAB — PREPARE RBC (CROSSMATCH)

## 2019-09-20 LAB — MAGNESIUM: Magnesium: 2.3 mg/dL (ref 1.7–2.4)

## 2019-09-20 LAB — PHOSPHORUS: Phosphorus: 4 mg/dL (ref 2.5–4.6)

## 2019-09-20 SURGERY — ESOPHAGOGASTRODUODENOSCOPY (EGD) WITH PROPOFOL
Anesthesia: General

## 2019-09-20 MED ORDER — PROPOFOL 500 MG/50ML IV EMUL
INTRAVENOUS | Status: DC | PRN
  Administered 2019-09-20: 77 ug/kg/min via INTRAVENOUS

## 2019-09-20 MED ORDER — POTASSIUM CHLORIDE 20 MEQ PO PACK
40.0000 meq | PACK | Freq: Once | ORAL | Status: AC
  Administered 2019-09-20: 40 meq via ORAL
  Filled 2019-09-20: qty 2

## 2019-09-20 MED ORDER — SODIUM CHLORIDE 0.9% IV SOLUTION: Freq: Once | INTRAVENOUS | Status: AC

## 2019-09-20 MED ORDER — PROPOFOL 500 MG/50ML IV EMUL
INTRAVENOUS | Status: AC
  Filled 2019-09-20: qty 50

## 2019-09-20 MED ORDER — SODIUM CHLORIDE 0.9 % IV SOLN: INTRAVENOUS | Status: DC

## 2019-09-20 MED ORDER — SODIUM CHLORIDE 0.9 % IV SOLN
300.0000 mg | Freq: Once | INTRAVENOUS | Status: AC
  Administered 2019-09-20: 300 mg via INTRAVENOUS
  Filled 2019-09-20: qty 15

## 2019-09-20 NOTE — Anesthesia Preprocedure Evaluation (Addendum)
Anesthesia Evaluation  Patient identified by MRN, date of birth, ID band Patient awake    Reviewed: Allergy & Precautions, H&P , NPO status , reviewed documented beta blocker date and time   Airway Mallampati: II  TM Distance: >3 FB Neck ROM: full    Dental  (+) Edentulous Upper, Edentulous Lower, Upper Dentures, Lower Dentures   Pulmonary sleep apnea and Continuous Positive Airway Pressure Ventilation , pneumonia, former smoker,  Pt with OSA and aspiration pneumonia. On 2-3L [place] to maintain O2 sats.   Pulmonary exam normal        Cardiovascular hypertension, + Past MI, +CHF and + DOE  Normal cardiovascular exam  09/09/2019 ECHO IMPRESSIONS    1. Left ventricular ejection fraction, by estimation, is 55 to 60%. The  left ventricle has normal function. The left ventricle has no regional  wall motion abnormalities. Left ventricular diastolic parameters are  indeterminate. Septal flattening in  systole consistent with elevated right heart pressures.  2. Right ventricular systolic function was not well visualized. The right  ventricular size is moderately enlarged. There is severely elevated  pulmonary artery systolic pressure. The estimated right ventricular  systolic pressure is 31.4 mmHg.  3. Left atrial size was mildly dilated.    Neuro/Psych    GI/Hepatic GERD  Controlled,  Endo/Other  diabetesHypothyroidism Morbid obesityMassively obese  Renal/GU Renal disease     Musculoskeletal   Abdominal   Peds  Hematology  (+) Blood dyscrasia, anemia ,   Anesthesia Other Findings Past Medical History: No date: Congestive heart failure (CHF) (H[facility]) No date: Hypertension No date: Hypothyroidism No date: Morbid obesity ([place]) No date: Obesity hypoventilation syndrome ([facility]) No date: Type 2 diabetes mellitus ([place])  Past Surgical History: 09/14/2019: DIALYSIS/PERMA CATHETER INSERTION; N/A     Comment:  Procedure:  DIALYSIS/PERMA CATHETER INSERTION;  Surgeon:               Katha Cabal, MD;  Location: [facility];               Service: Cardiovascular;  Laterality: N/A; No date: none  BMI    Body Mass Index: 53.60 kg/m      Reproductive/Obstetrics                            Anesthesia Physical Anesthesia Plan  ASA: IV  Anesthesia Plan: General   Post-op Pain Management:    Induction: Intravenous  PONV Risk Score and Plan: Treatment may vary due to age or medical condition and TIVA  Airway Management Planned: Nasal Cannula and Natural Airway  Additional Equipment:   Intra-op Plan:   Post-operative Plan:   Informed Consent: I have reviewed the patients History and Physical, chart, labs and discussed the procedure including the risks, benefits and alternatives for the proposed anesthesia with the patient or authorized representative who has indicated his/her understanding and acceptance.     Dental Advisory Given  Plan Discussed with: CRNA  Anesthesia Plan Comments: (Pt is very high risk 2 to OSA, BMI > 50, Pulm HTN w PA sys 70, cardiac hx, O2 dependence.)        Anesthesia Quick Evaluation

## 2019-09-20 NOTE — Transfer of Care (Signed)
Immediate Anesthesia Transfer of Care Note  Patient: Andrew Valentine  Procedure(s) Performed: ESOPHAGOGASTRODUODENOSCOPY (EGD) WITH PROPOFOL (N/A )  Patient Location: Endoscopy Unit  Anesthesia Type:General  Level of Consciousness: awake  Airway & Oxygen Therapy: Patient Spontanous Breathing and Patient connected to face mask oxygen  Post-op Assessment: Report given to RN and Post -op Vital signs reviewed and stable  Post vital signs: Reviewed and stable  Last Vitals:  Vitals Value Taken Time  BP 101/69 09/20/19 1451  Temp    Pulse 98 09/20/19 1451  Resp 20 09/20/19 1451  SpO2 100 % 09/20/19 1451  Vitals shown include unvalidated device data.  Last Pain:  Vitals:   09/20/19 1252  TempSrc: Temporal  PainSc: 0-No pain         Complications: No complications documented.

## 2019-09-20 NOTE — Progress Notes (Addendum)
PROGRESS NOTE    Andrew Valentine  GQQ:[id number] DOB: 16-Nov-1940 DOA: 09/08/2019 PCP: [facility] [place] complaint.  Altered mental status. Brief Narrative:  83M hx of Morbid obesity BMI >50, CHF, DM, hypothoroidism, came from home due to  AKI. He lives alone and cannot take care of himself. He was noted to be lethargic in ED with hypercapnia and hypoxemia. Daughter she states she came to check on him and blood glucose was 47, he was unable to get out of chair. He was weak could not walk, desaturating 70s. He was encephalopathic at this time. Daughter lives next door.Was admitted to ICU initially. Patient had a significant weight gain over the last 2 weeks.  He has anasarca.  He was placed on CRRT.  09/10/19- patient is awake this am, he on levphed 25, hes on CRRT with dyasylate modifcation this am by renal team. S/p wound care eval, UOP has improved.  8/31- patient weaned off vasopressin, weaned levophed from 25>>10.  9/1- patient was evaluated by [facility], he does not wish to have permanent dialysis. Code status discussion in progress.  9/2- Plan to remove Left IJ HD catheter, trial of Lasix as per Nephrology 9/3- patient reports feeling well, he ate dinner states he feels well. S/p vascular eval for perm cath placement. 1 unit prbc transfustion ordered, heparin stopped due to borderline low h/h 9/4 medical therapy  9/8.  Patient had a significant black stool, positive occult blood.  Change Protonix to 40 mg IV twice a day, GI consult.  He has been making large amount of urine, has not been dialyzed since 9/4.  9/9.  EGD showed multiple gastric ulcers.  No active bleeding.  Hemoglobin dropped down to 7.0, transfuse 1 unit PRBC.   Assessment & Plan:   Active Problems:   Acute hypoxemic respiratory failure (HCC)   Acute renal failure (HCC)   Hyperkalemia   NSTEMI (non-ST elevated myocardial infarction) (HCC)   Pressure injury of skin   Pulmonary hypertension, unspecified  (HCC)   Persistent atrial fibrillation (HCC)   Anemia   Acute right-sided CHF (congestive heart failure) ([place])   Acute blood loss anemia  #1.  Acute hypoxemic respiratory failure. Secondary to severe pulmonary hypertension, obesity hypoventilation syndrome, obstructive sleep apnea, right-sided congestive heart failure, as well as aspiration pneumonia. Condition had improved.  Currently on 2 L oxygen.  Patient may need home oxygen.  #2.  Acute kidney injury on chronic kidney disease stage IIIb. Renal function has been stable now.  But still above baseline.  Followed by nephrology, temporary dialysis catheter still in place.  Has not been needing further dialysis.  3.  Acute right side congestive heart failure with anasarca and a severe pulmonary hypertension. Condition has been improving.  4.  Acute blood loss anemia secondary to upper GI bleed. Hemoglobin dropped down to 7.0, patient feels dizzy.  Will transfuse 1 unit PRBC.  Patient has a mild iron deficient anemia, received IV iron.  5.  Upper GI bleed secondary to gastric ulcers. Patient had a EGD performed today, showed multiple gastric ulcers.  Continue oral PPI.  6.  Hypotension. Patient was on vasopressor in ICU.  Blood pressure has been stabilized.  7.  Obstructive sleep apnea. Continue CPAP while asleep.    DVT prophylaxis: SCDs Code Status: DNR Family Communication: Discussed with patient son.  .   Status is: Inpatient  Remains inpatient appropriate because:Inpatient level of care appropriate due to severity of illness   Dispo: The  patient is from: Home              Anticipated d/c is to: SNF              Anticipated d/c date is: 3 days              Patient currently is not medically stable to d/c.        I/O last 3 completed shifts: In: 294.2 [P.O.:120; I.V.:84.2; IV Piggyback:90] Out: 1100 [Urine:1100] Total I/O In: 211 [I.V.:211] Out: -      Consultants:   Nephrology and GI  Procedures:  EGD, HD  Antimicrobials:None  Subjective: Patient no longer has any black stool today.  He still has significant weakness.  Short of breath with exertion.  No cough. No fever chills. Still making large amount of urine.  Objective: Vitals:   09/20/19 0042 09/20/19 0741 09/20/19 1252 09/20/19 1451  BP: (!) 146/85 (!) 117/59 124/61 101/69  Pulse: 60 66 80   Resp: 20 18 20    Temp: 98 F (36.7 C) 97.7 F (36.5 C) (!) 97 F (36.1 C)   TempSrc: Oral Oral Temporal   SpO2: 97% 95% 100%   Weight:   (!) 159.9 kg   Height:   5\' 8"  (1.727 m)     Intake/Output Summary (Last 24 hours) at 09/20/2019 1556 Last data filed at 09/20/2019 1452 Gross per 24 hour  Intake 385.17 ml  Output 150 ml  Net 235.17 ml   Filed Weights   09/18/19 0631 09/19/19 0637 09/20/19 1252  Weight: (!) 161.6 kg (!) 159.9 kg (!) 159.9 kg    Examination:  General exam: Morbid obese, ill-appearing. Respiratory system: Breathing sounds bilaterally. Respiratory effort normal. Cardiovascular system: S1 & S2 heard, RRR. No JVD, murmurs, rubs, gallops or clicks. No pedal edema. Gastrointestinal system: Abdomen is nondistended, soft and nontender. No organomegaly or masses felt. Normal bowel sounds heard. Central nervous system: Alert and oriented x3. No focal neurological deficits. Extremities: Symmetric . Skin: No rashes, lesions or ulcers Psychiatry:  Mood & affect appropriate.     Data Reviewed: I have personally reviewed following labs and imaging studies  CBC: Recent Labs  Lab 09/15/19 1507 09/16/19 1106 09/17/19 0803 09/18/19 0449 09/20/19 0725  WBC 13.2* 14.3* 13.9* 11.9* 9.2  NEUTROABS  --   --   --   --  6.9  HGB 6.9* 7.5* 7.7* 7.5* 7.0*  HCT 20.7* 23.4* 23.5* 22.7* 23.1*  MCV 95.0 98.3 96.7 97.0 101.8*  PLT 112* 129* 126* 122* 106*   Basic Metabolic Panel: Recent Labs  Lab 09/15/19 1414 09/15/19 1414 09/16/19 1106 09/17/19 0803 09/18/19 0449 09/19/19 0537 09/20/19 0725  NA 136  --  137  139 139  --  142  K 3.7  --  3.7 3.8 3.6  --  3.4*  CL 102  --  102 105 103  --  104  CO2 30  --  28 29 29   --  30  GLUCOSE 122*  --  180* 165* 165*  --  132*  BUN 60*  --  77* 81* 85*  --  88*  CREATININE 2.02*   < > 2.44* 2.78* 3.06* 3.08* 3.07*  CALCIUM 7.7*  --  8.1* 8.3* 8.4*  --  8.4*  MG 1.8   < > 1.8 2.1 2.0 2.2 2.3  PHOS 3.5   < > 3.6 4.4 3.8 4.2 4.0   < > = values in this interval not displayed.   GFR:  Estimated Creatinine Clearance: 29 mL/min (A) (by C-G formula based on SCr of 3.07 mg/dL (H)). Liver Function Tests: Recent Labs  Lab 09/14/19 0312  ALBUMIN 2.3*   No results for input(s): LIPASE, AMYLASE in the last 168 hours. No results for input(s): AMMONIA in the last 168 hours. Coagulation Profile: No results for input(s): INR, PROTIME in the last 168 hours. Cardiac Enzymes: No results for input(s): CKTOTAL, CKMB, CKMBINDEX, TROPONINI in the last 168 hours. BNP (last 3 results) No results for input(s): PROBNP in the last 8760 hours. HbA1C: No results for input(s): HGBA1C in the last 72 hours. CBG: Recent Labs  Lab 09/20/19 0044 09/20/19 0411 09/20/19 0743 09/20/19 1123 09/20/19 1257  GLUCAP 160* 129* 122* 117* 126*   Lipid Profile: No results for input(s): CHOL, HDL, LDLCALC, TRIG, CHOLHDL, LDLDIRECT in the last 72 hours. Thyroid Function Tests: No results for input(s): TSH, T4TOTAL, FREET4, T3FREE, THYROIDAB in the last 72 hours. Anemia Panel: No results for input(s): VITAMINB12, FOLATE, FERRITIN, TIBC, IRON, RETICCTPCT in the last 72 hours. Sepsis Labs: No results for input(s): PROCALCITON, LATICACIDVEN in the last 168 hours.  No results found for this or any previous visit (from the past 240 hour(s)).       Radiology Studies: No results found.      Scheduled Meds: . (feeding supplement) PROSource Plus  30 mL Oral TID WC  . sodium chloride   Intravenous Once  . sodium chloride   Intravenous Once  . Chlorhexidine Gluconate Cloth  6 each  Topical Daily  . epoetin (EPOGEN/PROCRIT) injection  20,000 Units Subcutaneous Weekly  . feeding supplement (NEPRO CARB STEADY)  237 mL Oral TID BM  . fludrocortisone  0.2 mg Oral BID  . insulin aspart  0-15 Units Subcutaneous Q4H  . levothyroxine  175 mcg Oral Daily  . mouth rinse  15 mL Mouth Rinse BID  . midodrine  10 mg Oral TID WC  . multivitamin with minerals  1 tablet Oral Daily  . nystatin cream   Topical BID  . [START ON 09/23/2019] pantoprazole  40 mg Intravenous Q12H  . potassium chloride  40 mEq Oral Once  . senna  1 tablet Oral BID  . zinc oxide  1 application Topical Daily   Continuous Infusions: . albumin human 12.5 g (09/20/19 0848)  . iron sucrose    . pantoprozole (PROTONIX) infusion 8 mg/hr (09/20/19 0955)     LOS: 12 days    Time spent: 28 minutes    Sharen Hones, MD [facility]   To contact the attending provider between 7A-7P or the covering provider during after hours 7P-7A, please log into the web site [email or web address] and access using universal [place] password for that web site. If you do not have the password, please call the hospital operator.  09/20/2019, 3:56 PM

## 2019-09-20 NOTE — Op Note (Signed)
[facility] Patient Name: Andrew Valentine Procedure Date: 09/20/2019 2:23 PM MRN: [id number] Account #: [id number] Date of Birth: [birth year] Admit Type: Inpatient Age: 79 Room: [facility] ENDO ROOM 3 Gender: Male Note Status: Finalized Procedure:             Upper GI endoscopy Indications:           Acute post hemorrhagic anemia, Unexplained iron                         deficiency anemia, Melena Providers:             Lin Landsman MD, MD Referring MD:          No Local Md, MD (Referring MD) Medicines:             Monitored Anesthesia Care Complications:         No immediate complications. Estimated blood loss: None. Procedure:             Pre-Anesthesia Assessment:                        - Prior to the procedure, a History and Physical was                         performed, and patient medications and allergies were                         reviewed. The patient is competent. The risks and                         benefits of the procedure and the sedation options and                         risks were discussed with the patient. All questions                         were answered and informed consent was obtained.                         Patient identification and proposed procedure were                         verified by the physician, the nurse, the                         anesthesiologist, the anesthetist and the technician                         in the pre-procedure area in the procedure room in the                         endoscopy suite. Mental Status Examination: alert and                         oriented. Airway Examination: normal oropharyngeal                         airway and neck mobility. Respiratory Examination:  clear to auscultation. CV Examination: irregularly                         irregular rate and rhythm. Prophylactic Antibiotics:                         The patient does not require prophylactic antibiotics.                          Prior Anticoagulants: The patient has taken no                         previous anticoagulant or antiplatelet agents. ASA                         Grade Assessment: IV - A patient with severe systemic                         disease that is a constant threat to life. After                         reviewing the risks and benefits, the patient was                         deemed in satisfactory condition to undergo the                         procedure. The anesthesia plan was to use monitored                         anesthesia care (MAC). Immediately prior to                         administration of medications, the patient was                         re-assessed for adequacy to receive sedatives. The                         heart rate, respiratory rate, oxygen saturations,                         blood pressure, adequacy of pulmonary ventilation, and                         response to care were monitored throughout the                         procedure. The physical status of the patient was                         re-assessed after the procedure.                        After obtaining informed consent, the endoscope was                         passed under direct vision. Throughout the procedure,  the patient's blood pressure, pulse, and oxygen                         saturations were monitored continuously. The Endoscope                         was introduced through the mouth, and advanced to the                         second part of duodenum. The upper GI endoscopy was                         accomplished without difficulty. The patient tolerated                         the procedure well. Findings:      The duodenal bulb and second portion of the duodenum were normal.      Many non-bleeding superficial gastric ulcers with a clean ulcer base       (Forrest Class III) were found in the gastric body and in the prepyloric       region of  the stomach. The largest lesion was 5 mm in largest dimension.       Biopsies were taken with a cold forceps for Helicobacter pylori testing.      The cardia and gastric fundus were normal on retroflexion.      Esophagogastric landmarks were identified: the gastroesophageal junction       was found at 41 cm from the incisors.      The gastroesophageal junction and examined esophagus were normal. Impression:            - Normal duodenal bulb and second portion of the                         duodenum.                        - Non-bleeding gastric ulcers with a clean ulcer base                         (Forrest Class III). Biopsied.                        - Esophagogastric landmarks identified.                        - Normal gastroesophageal junction and esophagus. Recommendation:        - Await pathology results.                        - Return patient to hospital ward for ongoing care.                        - Cardiac diet and diabetic (ADA) diet today.                        - Use Prilosec (omeprazole) 40 mg PO BID for 3 months.                        - Perform an H. pylori  serology. Procedure Code(s):     --- Professional ---                        [phone], Esophagogastroduodenoscopy, flexible,                         transoral; with biopsy, single or multiple Diagnosis Code(s):     --- Professional ---                        K25.9, Gastric ulcer, unspecified as acute or chronic,                         without hemorrhage or perforation                        D62, Acute posthemorrhagic anemia                        D50.9, Iron deficiency anemia, unspecified                        K92.1, Melena (includes Hematochezia) CPT copyright 2019 [employer]. All rights reserved. The codes documented in this report are preliminary and upon coder review may  be revised to meet current compliance requirements. Dr. Ulyess Mort Lin Landsman MD, MD 09/20/2019 2:50:07 PM This  report has been signed electronically. Number of Addenda: 0 Note Initiated On: 09/20/2019 2:23 PM Estimated Blood Loss:  Estimated blood loss: none.      [facility]

## 2019-09-20 NOTE — Progress Notes (Signed)
PT Cancellation Note  Patient Details Name: Andrew Valentine MRN: [id number] DOB: [birth year]   Cancelled Treatment:    Reason Eval/Treat Not Completed: Patient at procedure or test/unavailable. PT will continue to follow per current POC and return when pt is appropriate to participate.    Willette Pa 09/20/2019, 12:57 PM

## 2019-09-20 NOTE — Progress Notes (Signed)
[facility]  ROUNDING NOTE   Subjective:   Patient is awake and alert, resting in bed,wearing CPAP, reports it was placed to help him sleep. He is waiting for endoscopy today, NPO for the procedure. He denies worsening SOB,abdominal pain, nausea and vomiting.  09/08 0701 - 09/09 0700 In: 294.2 [P.O.:120; I.V.:84.2; IV Piggyback:90] Out: 150 [Urine:150]    Objective:  Vital signs in last 24 hours:  Temp:  [97.7 F (36.5 C)-98.4 F (36.9 C)] 97.7 F (36.5 C) (09/09 0741) Pulse Rate:  [60-84] 66 (09/09 0741) Resp:  [18-20] 18 (09/09 0741) BP: (117-146)/(55-85) 117/59 (09/09 0741) SpO2:  [94 %-97 %] 95 % (09/09 0741)  Weight change:  Filed Weights   09/14/19 1134 09/18/19 0631 09/19/19 0637  Weight: (!) 163.3 kg (!) 161.6 kg (!) 159.9 kg    Intake/Output: I/O last 3 completed shifts: In: 294.2 [P.O.:120; I.V.:84.2; IV Piggyback:90] Out: 1100 [Urine:1100]   Intake/Output this shift:  No intake/output data recorded.  Physical Exam: General: In  no acute distress  Head: Oral mucosal membranes moist, CPAP  in place with 5L O2  Lungs:  Lungs diminished at the bases, Normal and symmetrical effort  Heart:  S1S2, ,irregular  Abdomen:  Non distended  Extremities: Edema + on bilateral thighs   Neurologic:  Alert,oriented  Skin: No new lesions or rashes  Access: LIJ temp HD catheter    Basic Metabolic Panel: Recent Labs  Lab 09/15/19 1414 09/15/19 1414 09/16/19 1106 09/16/19 1106 09/17/19 0803 09/18/19 0449 09/19/19 0537 09/20/19 0725  NA 136  --  137  --  139 139  --  142  K 3.7  --  3.7  --  3.8 3.6  --  3.4*  CL 102  --  102  --  105 103  --  104  CO2 30  --  28  --  29 29  --  30  GLUCOSE 122*  --  180*  --  165* 165*  --  132*  BUN 60*  --  77*  --  81* 85*  --  88*  CREATININE 2.02*   < > 2.44*  --  2.78* 3.06* 3.08* 3.07*  CALCIUM 7.7*   < > 8.1*   < > 8.3* 8.4*  --  8.4*  MG 1.8   < > 1.8  --  2.1 2.0 2.2 2.3  PHOS 3.5   < > 3.6  --  4.4  3.8 4.2 4.0   < > = values in this interval not displayed.    Liver Function Tests: Recent Labs  Lab 09/14/19 0312  ALBUMIN 2.3*   No results for input(s): LIPASE, AMYLASE in the last 168 hours. No results for input(s): AMMONIA in the last 168 hours.  CBC: Recent Labs  Lab 09/15/19 1507 09/16/19 1106 09/17/19 0803 09/18/19 0449 09/20/19 0725  WBC 13.2* 14.3* 13.9* 11.9* 9.2  NEUTROABS  --   --   --   --  6.9  HGB 6.9* 7.5* 7.7* 7.5* 7.0*  HCT 20.7* 23.4* 23.5* 22.7* 23.1*  MCV 95.0 98.3 96.7 97.0 101.8*  PLT 112* 129* 126* 122* 109*    Cardiac Enzymes: No results for input(s): CKTOTAL, CKMB, CKMBINDEX, TROPONINI in the last 168 hours.  BNP: Invalid input(s): POCBNP  CBG: Recent Labs  Lab 09/19/19 1529 09/19/19 1953 09/20/19 0044 09/20/19 0411 09/20/19 0743  GLUCAP 221* 193* 160* 129* 122*    Microbiology: Results for orders placed or performed during the hospital encounter of 09/08/19  MRSA PCR Screening     Status: None   Collection Time: 09/08/19 11:49 AM   Specimen: Nasopharyngeal  Result Value Ref Range Status   MRSA by PCR NEGATIVE NEGATIVE Final    Comment:        The GeneXpert MRSA Assay (FDA approved for NASAL specimens only), is one component of a comprehensive MRSA colonization surveillance program. It is not intended to diagnose MRSA infection nor to guide or monitor treatment for MRSA infections. Performed at [facility], [address] [place], [place] [address]   SARS Coronavirus 2 by RT PCR (hospital order, performed in [facility] hospital lab) Nasopharyngeal Nasopharyngeal Swab     Status: None   Collection Time: 09/08/19 12:05 PM   Specimen: Nasopharyngeal Swab  Result Value Ref Range Status   SARS Coronavirus 2 NEGATIVE NEGATIVE Final    Comment: (NOTE) SARS-CoV-2 target nucleic acids are NOT DETECTED.  The SARS-CoV-2 RNA is generally detectable in upper and lower respiratory specimens during the acute phase of  infection. The lowest concentration of SARS-CoV-2 viral copies this assay can detect is 250 copies / mL. A negative result does not preclude SARS-CoV-2 infection and should not be used as the sole basis for treatment or other patient management decisions.  A negative result may occur with improper specimen collection / handling, submission of specimen other than nasopharyngeal swab, presence of viral mutation(s) within the areas targeted by this assay, and inadequate number of viral copies (<250 copies / mL). A negative result must be combined with clinical observations, patient history, and epidemiological information.  Fact Sheet for Patients:   StrictlyIdeas.no  Fact Sheet for Healthcare Providers: BankingDealers.co.za  This test is not yet approved or  cleared by the [place] FDA and has been authorized for detection and/or diagnosis of SARS-CoV-2 by FDA under an Emergency Use Authorization (EUA).  This EUA will remain in effect (meaning this test can be used) for the duration of the COVID-19 declaration under Section 564(b)(1) of the Act, 21 U.S.C. section 360bbb-3(b)(1), unless the authorization is terminated or revoked sooner.  Performed at [facility], [address] [place], [place] [address]   Blood culture (routine x 2)     Status: Abnormal   Collection Time: 09/08/19 12:07 PM   Specimen: BLOOD  Result Value Ref Range Status   Specimen Description   Final    BLOOD L DISTAL FOREARM Performed at [facility], [address] [place], [place] [address]    Special Requests   Final    BOTTLES DRAWN AEROBIC AND ANAEROBIC Blood Culture results may not be optimal due to an excessive volume of blood received in culture bottles Performed at [facility], [address] [place], [place] [address]    Culture  Setup Time   Final    GRAM POSITIVE COCCI AEROBIC BOTTLE ONLY CRITICAL RESULT CALLED  TO, READ BACK BY AND VERIFIED WITH: MORGAN CUNNINGHAM  AT 4270 ON 09/09/19 SNG    Culture (A)  Final    STAPHYLOCOCCUS EPIDERMIDIS THE SIGNIFICANCE OF ISOLATING THIS ORGANISM FROM A SINGLE SET OF BLOOD CULTURES WHEN MULTIPLE SETS ARE DRAWN IS UNCERTAIN. PLEASE NOTIFY THE MICROBIOLOGY DEPARTMENT WITHIN ONE WEEK IF SPECIATION AND SENSITIVITIES ARE REQUIRED. Performed at [facility], [place] [address] [place], [place] [address]    Report Status 09/11/2019 FINAL  Final  Blood Culture ID Panel (Reflexed)     Status: Abnormal   Collection Time: 09/08/19 12:07 PM  Result Value  Ref Range Status   Enterococcus faecalis NOT DETECTED NOT DETECTED Final   Enterococcus Faecium NOT DETECTED NOT DETECTED Final   Listeria monocytogenes NOT DETECTED NOT DETECTED Final   Staphylococcus species DETECTED (A) NOT DETECTED Final    Comment: CRITICAL RESULT CALLED TO, READ BACK BY AND VERIFIED WITH: MORGAN CUNNINGHAM AT 1345 ON 09/09/19 SNG    Staphylococcus aureus (BCID) NOT DETECTED NOT DETECTED Final   Staphylococcus epidermidis DETECTED (A) NOT DETECTED Final    Comment: CRITICAL RESULT CALLED TO, READ BACK BY AND VERIFIED WITH: MORGAN CUNNINGHAM AT 1345 ON 09/09/19 SNG    Staphylococcus lugdunensis NOT DETECTED NOT DETECTED Final   Streptococcus species NOT DETECTED NOT DETECTED Final   Streptococcus agalactiae NOT DETECTED NOT DETECTED Final   Streptococcus pneumoniae NOT DETECTED NOT DETECTED Final   Streptococcus pyogenes NOT DETECTED NOT DETECTED Final   A.calcoaceticus-baumannii NOT DETECTED NOT DETECTED Final   Bacteroides fragilis NOT DETECTED NOT DETECTED Final   Enterobacterales NOT DETECTED NOT DETECTED Final   Enterobacter cloacae complex NOT DETECTED NOT DETECTED Final   Escherichia coli NOT DETECTED NOT DETECTED Final   Klebsiella aerogenes NOT DETECTED NOT DETECTED Final   Klebsiella oxytoca NOT DETECTED NOT DETECTED Final   Klebsiella pneumoniae NOT DETECTED NOT DETECTED Final    Proteus species NOT DETECTED NOT DETECTED Final   Salmonella species NOT DETECTED NOT DETECTED Final   Serratia marcescens NOT DETECTED NOT DETECTED Final   Haemophilus influenzae NOT DETECTED NOT DETECTED Final   Neisseria meningitidis NOT DETECTED NOT DETECTED Final   Pseudomonas aeruginosa NOT DETECTED NOT DETECTED Final   Stenotrophomonas maltophilia NOT DETECTED NOT DETECTED Final   Candida albicans NOT DETECTED NOT DETECTED Final   Candida auris NOT DETECTED NOT DETECTED Final   Candida glabrata NOT DETECTED NOT DETECTED Final   Candida krusei NOT DETECTED NOT DETECTED Final   Candida parapsilosis NOT DETECTED NOT DETECTED Final   Candida tropicalis NOT DETECTED NOT DETECTED Final   Cryptococcus neoformans/gattii NOT DETECTED NOT DETECTED Final   Methicillin resistance mecA/C NOT DETECTED NOT DETECTED Final    Comment: Performed at [facility], [address] [place], [place] [address]  Blood culture (routine x 2)     Status: None   Collection Time: 09/08/19 12:59 PM   Specimen: BLOOD  Result Value Ref Range Status   Specimen Description BLOOD LEFT UPPER FORARM  Final   Special Requests   Final    BOTTLES DRAWN AEROBIC AND ANAEROBIC Blood Culture results may not be optimal due to an excessive volume of blood received in culture bottles   Culture   Final    NO GROWTH 5 DAYS Performed at [facility], [address] [place], [place] [address]    Report Status 09/13/2019 FINAL  Final    Coagulation Studies: No results for input(s): LABPROT, INR in the last 72 hours.  Urinalysis: No results for input(s): COLORURINE, LABSPEC, PHURINE, GLUCOSEU, HGBUR, BILIRUBINUR, KETONESUR, PROTEINUR, UROBILINOGEN, NITRITE, LEUKOCYTESUR in the last 72 hours.  Invalid input(s): APPERANCEUR    Imaging: No results found.   Medications:   . sodium chloride    . albumin human 12.5 g (09/20/19 0848)  . pantoprozole (PROTONIX) infusion 8 mg/hr (09/20/19 0955)   .  (feeding supplement) PROSource Plus  30 mL Oral TID WC  . sodium chloride   Intravenous Once  . Chlorhexidine Gluconate Cloth  6 each Topical Daily  . epoetin (EPOGEN/PROCRIT) injection  20,000 Units Subcutaneous Weekly  . feeding supplement (NEPRO  CARB STEADY)  237 mL Oral TID BM  . fludrocortisone  0.2 mg Oral BID  . insulin aspart  0-15 Units Subcutaneous Q4H  . levothyroxine  175 mcg Oral Daily  . mouth rinse  15 mL Mouth Rinse BID  . midodrine  10 mg Oral TID WC  . multivitamin with minerals  1 tablet Oral Daily  . nystatin cream   Topical BID  . [START ON 09/23/2019] pantoprazole  40 mg Intravenous Q12H  . senna  1 tablet Oral BID  . zinc oxide  1 application Topical Daily   acetaminophen **OR** acetaminophen, heparin sodium (porcine), HYDROmorphone (DILAUDID) injection, ipratropium-albuterol, ondansetron **OR** ondansetron (ZOFRAN) IV, ondansetron (ZOFRAN) IV, polyethylene glycol  Assessment/ Plan:  Mr. Emanuelle Bastos is a 79 y.o. 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  Baseline Creatinine 1.5  Nonoliguric urine output. 09/08 0701 - 09/09 0700 In: 294.2 [P.O.:120; I.V.:84.2; IV Piggyback:90] Out: 150 [Urine:150]   - Required renal replacement therapy. CVVHDF from 8/29 to 9/2 - IHD on 9/4 -Urine output low, 150 ml,but full foley bag noted  - will continue  close monitoring  - IV albumin for 3rd spacing of fluid -No dialysis today. - will consider removing temp cath soon  #Hypotension Blood pressure reading in low normal range Required vasopressors during hospital stay- norepinephrine, cardiogenic and septic shock from pneumonia  Will continue midodrine And Fudrocortisone 0.2 mg twice a day   # Pneumonia Improved Patient has underlying pulmonary hypertension, obstructive sleep apnea and morbid obesity   #Anemia of chronic disease Lab Results  Component Value Date   HGB 7.0 (L) 09/20/2019    Patient has black tarry stools and stool positive for occult blood. Planning Endoscopy today- non bleeding gastric ulcers Will continue Epogen SQ weekly (Wednesday)     LOS: 12 Andrew Valentine 9/9/202110:49 AM

## 2019-09-21 ENCOUNTER — Encounter: Payer: Self-pay | Admitting: Gastroenterology

## 2019-09-21 DIAGNOSIS — I50811 Acute right heart failure: Secondary | ICD-10-CM

## 2019-09-21 LAB — CBC WITH DIFFERENTIAL/PLATELET
Abs Immature Granulocytes: 0.09 10*3/uL — ABNORMAL HIGH (ref 0.00–0.07)
Basophils Absolute: 0 10*3/uL (ref 0.0–0.1)
Basophils Relative: 0 %
Eosinophils Absolute: 0.6 10*3/uL — ABNORMAL HIGH (ref 0.0–0.5)
Eosinophils Relative: 5 %
HCT: 26.5 % — ABNORMAL LOW (ref 39.0–52.0)
Hemoglobin: 8.2 g/dL — ABNORMAL LOW (ref 13.0–17.0)
Immature Granulocytes: 1 %
Lymphocytes Relative: 4 %
Lymphs Abs: 0.5 10*3/uL — ABNORMAL LOW (ref 0.7–4.0)
MCH: 31.2 pg (ref 26.0–34.0)
MCHC: 30.9 g/dL (ref 30.0–36.0)
MCV: 100.8 fL — ABNORMAL HIGH (ref 80.0–100.0)
Monocytes Absolute: 0.9 10*3/uL (ref 0.1–1.0)
Monocytes Relative: 7 %
Neutro Abs: 10.7 10*3/uL — ABNORMAL HIGH (ref 1.7–7.7)
Neutrophils Relative %: 83 %
Platelets: 105 10*3/uL — ABNORMAL LOW (ref 150–400)
RBC: 2.63 MIL/uL — ABNORMAL LOW (ref 4.22–5.81)
RDW: 19.4 % — ABNORMAL HIGH (ref 11.5–15.5)
WBC: 12.8 10*3/uL — ABNORMAL HIGH (ref 4.0–10.5)
nRBC: 0.2 % (ref 0.0–0.2)

## 2019-09-21 LAB — TYPE AND SCREEN
ABO/RH(D): O POS
Antibody Screen: NEGATIVE
Unit division: 0

## 2019-09-21 LAB — GLUCOSE, CAPILLARY
Glucose-Capillary: 154 mg/dL — ABNORMAL HIGH (ref 70–99)
Glucose-Capillary: 156 mg/dL — ABNORMAL HIGH (ref 70–99)
Glucose-Capillary: 185 mg/dL — ABNORMAL HIGH (ref 70–99)
Glucose-Capillary: 155 mg/dL — ABNORMAL HIGH (ref 70–99)
Glucose-Capillary: 148 mg/dL — ABNORMAL HIGH (ref 70–99)
Glucose-Capillary: 141 mg/dL — ABNORMAL HIGH (ref 70–99)

## 2019-09-21 LAB — BASIC METABOLIC PANEL
Anion gap: 6 (ref 5–15)
BUN: 80 mg/dL — ABNORMAL HIGH (ref 8–23)
CO2: 31 mmol/L (ref 22–32)
Calcium: 8.3 mg/dL — ABNORMAL LOW (ref 8.9–10.3)
Chloride: 108 mmol/L (ref 98–111)
Creatinine, Ser: 2.75 mg/dL — ABNORMAL HIGH (ref 0.61–1.24)
GFR calc Af Amer: 24 mL/min — ABNORMAL LOW (ref >60)
GFR calc non Af Amer: 21 mL/min — ABNORMAL LOW (ref >60)
Glucose, Bld: 168 mg/dL — ABNORMAL HIGH (ref 70–99)
Potassium: 3.8 mmol/L (ref 3.5–5.1)
Sodium: 145 mmol/L (ref 135–145)

## 2019-09-21 LAB — BPAM RBC
Blood Product Expiration Date: 202110112359
ISSUE DATE / TIME: 202109091714
Unit Type and Rh: 5100

## 2019-09-21 LAB — MAGNESIUM: Magnesium: 2.3 mg/dL (ref 1.7–2.4)

## 2019-09-21 LAB — PHOSPHORUS: Phosphorus: 3.9 mg/dL (ref 2.5–4.6)

## 2019-09-21 MED ORDER — TORSEMIDE 20 MG PO TABS
40.0000 mg | ORAL_TABLET | Freq: Every day | ORAL | Status: DC
  Administered 2019-09-21 – 2019-09-22 (×2): 40 mg via ORAL
  Filled 2019-09-21 (×2): qty 2

## 2019-09-21 MED ORDER — LACTULOSE 10 GM/15ML PO SOLN
20.0000 g | Freq: Once | ORAL | Status: AC
  Administered 2019-09-21: 20 g via ORAL
  Filled 2019-09-21: qty 30

## 2019-09-21 MED ORDER — PANTOPRAZOLE SODIUM 40 MG PO TBEC
40.0000 mg | DELAYED_RELEASE_TABLET | Freq: Two times a day (BID) | ORAL | Status: DC
  Administered 2019-09-21 – 2019-09-23 (×5): 40 mg via ORAL
  Filled 2019-09-21 (×5): qty 1

## 2019-09-21 NOTE — Progress Notes (Signed)
[facility]  ROUNDING NOTE   Subjective:     09/09 0701 - 09/10 0700 In: 682.3 [I.V.:334.2; Blood:309; IV Piggyback:39.2] Out: 1500 [Urine:1500]  Doing fair. Denies any acute complaints Serum creatinine slightly lower today Continues to have large amount of lower extremity edema No complaints of nausea or vomiting   Objective:  Vital signs in last 24 hours:  Temp:  [97 F (36.1 C)-97.9 F (36.6 C)] 97.8 F (36.6 C) (09/10 0733) Pulse Rate:  [80-91] 88 (09/10 0733) Resp:  [16-20] 16 (09/10 0733) BP: (101-145)/(61-74) 138/72 (09/10 0733) SpO2:  [93 %-100 %] 93 % (09/10 0733) Weight:  [159.9 kg] 159.9 kg (09/09 1252)  Weight change:  Filed Weights   09/18/19 0631 09/19/19 0637 09/20/19 1252  Weight: (!) 161.6 kg (!) 159.9 kg (!) 159.9 kg    Intake/Output: I/O last 3 completed shifts: In: 856.5 [I.V.:418.3; Blood:309; IV Piggyback:129.2] Out: 4401 [UUVOZ:3664]   Intake/Output this shift:  No intake/output data recorded.  Physical Exam: General: In  no acute distress  Head: Oral mucosal membranes moist, CPAP  in place with 5L O2  Lungs:  Lungs diminished at the bases, Normal and symmetrical effort  Heart:  S1S2, ,irregular  Abdomen:  Non distended  Extremities: Edema + on bilateral thighs   Neurologic:  Alert,oriented  Skin: No new lesions or rashes  Access: LIJ temp HD catheter    Basic Metabolic Panel: Recent Labs  Lab 09/16/19 1106 09/16/19 1106 09/17/19 0803 09/17/19 0803 09/18/19 0449 09/19/19 0537 09/20/19 0725 09/21/19 0524  NA 137  --  139  --  139  --  142 145  K 3.7  --  3.8  --  3.6  --  3.4* 3.8  CL 102  --  105  --  103  --  104 108  CO2 28  --  29  --  29  --  30 31  GLUCOSE 180*  --  165*  --  165*  --  132* 168*  BUN 77*  --  81*  --  85*  --  88* 80*  CREATININE 2.44*   < > 2.78*  --  3.06* 3.08* 3.07* 2.75*  CALCIUM 8.1*   < > 8.3*   < > 8.4*  --  8.4* 8.3*  MG 1.8   < > 2.1  --  2.0 2.2 2.3 2.3  PHOS 3.6   < > 4.4   --  3.8 4.2 4.0 3.9   < > = values in this interval not displayed.    Liver Function Tests: No results for input(s): AST, ALT, ALKPHOS, BILITOT, PROT, ALBUMIN in the last 168 hours. No results for input(s): LIPASE, AMYLASE in the last 168 hours. No results for input(s): AMMONIA in the last 168 hours.  CBC: Recent Labs  Lab 09/16/19 1106 09/17/19 0803 09/18/19 0449 09/20/19 0725 09/21/19 0524  WBC 14.3* 13.9* 11.9* 9.2 12.8*  NEUTROABS  --   --   --  6.9 10.7*  HGB 7.5* 7.7* 7.5* 7.0* 8.2*  HCT 23.4* 23.5* 22.7* 23.1* 26.5*  MCV 98.3 96.7 97.0 101.8* 100.8*  PLT 129* 126* 122* 109* 105*    Cardiac Enzymes: No results for input(s): CKTOTAL, CKMB, CKMBINDEX, TROPONINI in the last 168 hours.  BNP: Invalid input(s): POCBNP  CBG: Recent Labs  Lab 09/20/19 1631 09/20/19 2017 09/20/19 2358 09/21/19 0410 09/21/19 0736  GLUCAP 138* 214* 202* 154* 156*    Microbiology: Results for orders placed or performed during the hospital encounter  of 09/08/19  MRSA PCR Screening     Status: None   Collection Time: 09/08/19 11:49 AM   Specimen: Nasopharyngeal  Result Value Ref Range Status   MRSA by PCR NEGATIVE NEGATIVE Final    Comment:        The GeneXpert MRSA Assay (FDA approved for NASAL specimens only), is one component of a comprehensive MRSA colonization surveillance program. It is not intended to diagnose MRSA infection nor to guide or monitor treatment for MRSA infections. Performed at [facility], [address] [place], [place] [address]   SARS Coronavirus 2 by RT PCR (hospital order, performed in [facility] hospital lab) Nasopharyngeal Nasopharyngeal Swab     Status: None   Collection Time: 09/08/19 12:05 PM   Specimen: Nasopharyngeal Swab  Result Value Ref Range Status   SARS Coronavirus 2 NEGATIVE NEGATIVE Final    Comment: (NOTE) SARS-CoV-2 target nucleic acids are NOT DETECTED.  The SARS-CoV-2 RNA is generally detectable in upper and  lower respiratory specimens during the acute phase of infection. The lowest concentration of SARS-CoV-2 viral copies this assay can detect is 250 copies / mL. A negative result does not preclude SARS-CoV-2 infection and should not be used as the sole basis for treatment or other patient management decisions.  A negative result may occur with improper specimen collection / handling, submission of specimen other than nasopharyngeal swab, presence of viral mutation(s) within the areas targeted by this assay, and inadequate number of viral copies (<250 copies / mL). A negative result must be combined with clinical observations, patient history, and epidemiological information.  Fact Sheet for Patients:   StrictlyIdeas.no  Fact Sheet for Healthcare Providers: BankingDealers.co.za  This test is not yet approved or  cleared by the [place] FDA and has been authorized for detection and/or diagnosis of SARS-CoV-2 by FDA under an Emergency Use Authorization (EUA).  This EUA will remain in effect (meaning this test can be used) for the duration of the COVID-19 declaration under Section 564(b)(1) of the Act, 21 U.S.C. section 360bbb-3(b)(1), unless the authorization is terminated or revoked sooner.  Performed at [facility], [address] [place], [place] [address]   Blood culture (routine x 2)     Status: Abnormal   Collection Time: 09/08/19 12:07 PM   Specimen: BLOOD  Result Value Ref Range Status   Specimen Description   Final    BLOOD L DISTAL FOREARM Performed at [facility], [address] [place], [place] [address]    Special Requests   Final    BOTTLES DRAWN AEROBIC AND ANAEROBIC Blood Culture results may not be optimal due to an excessive volume of blood received in culture bottles Performed at [facility], [address] [place], [place] [address]    Culture  Setup Time   Final    GRAM  POSITIVE COCCI AEROBIC BOTTLE ONLY CRITICAL RESULT CALLED TO, READ BACK BY AND VERIFIED WITH: MORGAN CUNNINGHAM  AT 1443 ON 09/09/19 SNG    Culture (A)  Final    STAPHYLOCOCCUS EPIDERMIDIS THE SIGNIFICANCE OF ISOLATING THIS ORGANISM FROM A SINGLE SET OF BLOOD CULTURES WHEN MULTIPLE SETS ARE DRAWN IS UNCERTAIN. PLEASE NOTIFY THE MICROBIOLOGY DEPARTMENT WITHIN ONE WEEK IF SPECIATION AND SENSITIVITIES ARE REQUIRED. Performed at [facility], [place] [address], [place], [place] [address]    Report Status 09/11/2019 FINAL  Final  Blood Culture ID Panel (Reflexed)     Status: Abnormal   Collection Time: 09/08/19 12:07 PM  Result Value Ref Range Status   Enterococcus faecalis NOT DETECTED NOT DETECTED Final   Enterococcus Faecium NOT DETECTED NOT DETECTED Final   Listeria monocytogenes NOT DETECTED NOT DETECTED Final   Staphylococcus species DETECTED (A) NOT DETECTED Final    Comment: CRITICAL RESULT CALLED TO, READ BACK BY AND VERIFIED WITH: MORGAN CUNNINGHAM AT 1345 ON 09/09/19 SNG    Staphylococcus aureus (BCID) NOT DETECTED NOT DETECTED Final   Staphylococcus epidermidis DETECTED (A) NOT DETECTED Final    Comment: CRITICAL RESULT CALLED TO, READ BACK BY AND VERIFIED WITH: MORGAN CUNNINGHAM AT 1345 ON 09/09/19 SNG    Staphylococcus lugdunensis NOT DETECTED NOT DETECTED Final   Streptococcus species NOT DETECTED NOT DETECTED Final   Streptococcus agalactiae NOT DETECTED NOT DETECTED Final   Streptococcus pneumoniae NOT DETECTED NOT DETECTED Final   Streptococcus pyogenes NOT DETECTED NOT DETECTED Final   A.calcoaceticus-baumannii NOT DETECTED NOT DETECTED Final   Bacteroides fragilis NOT DETECTED NOT DETECTED Final   Enterobacterales NOT DETECTED NOT DETECTED Final   Enterobacter cloacae complex NOT DETECTED NOT DETECTED Final   Escherichia coli NOT DETECTED NOT DETECTED Final   Klebsiella aerogenes NOT DETECTED NOT DETECTED Final   Klebsiella oxytoca NOT DETECTED NOT DETECTED Final    Klebsiella pneumoniae NOT DETECTED NOT DETECTED Final   Proteus species NOT DETECTED NOT DETECTED Final   Salmonella species NOT DETECTED NOT DETECTED Final   Serratia marcescens NOT DETECTED NOT DETECTED Final   Haemophilus influenzae NOT DETECTED NOT DETECTED Final   Neisseria meningitidis NOT DETECTED NOT DETECTED Final   Pseudomonas aeruginosa NOT DETECTED NOT DETECTED Final   Stenotrophomonas maltophilia NOT DETECTED NOT DETECTED Final   Candida albicans NOT DETECTED NOT DETECTED Final   Candida auris NOT DETECTED NOT DETECTED Final   Candida glabrata NOT DETECTED NOT DETECTED Final   Candida krusei NOT DETECTED NOT DETECTED Final   Candida parapsilosis NOT DETECTED NOT DETECTED Final   Candida tropicalis NOT DETECTED NOT DETECTED Final   Cryptococcus neoformans/gattii NOT DETECTED NOT DETECTED Final   Methicillin resistance mecA/C NOT DETECTED NOT DETECTED Final    Comment: Performed at [facility], [address] [place], [place] [address]  Blood culture (routine x 2)     Status: None   Collection Time: 09/08/19 12:59 PM   Specimen: BLOOD  Result Value Ref Range Status   Specimen Description BLOOD LEFT UPPER FORARM  Final   Special Requests   Final    BOTTLES DRAWN AEROBIC AND ANAEROBIC Blood Culture results may not be optimal due to an excessive volume of blood received in culture bottles   Culture   Final    NO GROWTH 5 DAYS Performed at [facility], [address] [place], [place] [address]    Report Status 09/13/2019 FINAL  Final    Coagulation Studies: No results for input(s): LABPROT, INR in the last 72 hours.  Urinalysis: No results for input(s): COLORURINE, LABSPEC, PHURINE, GLUCOSEU, HGBUR, BILIRUBINUR, KETONESUR, PROTEINUR, UROBILINOGEN, NITRITE, LEUKOCYTESUR in the last 72 hours.  Invalid input(s): APPERANCEUR    Imaging: No results found.   Medications:   . albumin human Stopped (09/20/19 0927)   . (feeding supplement)  PROSource Plus  30 mL Oral TID WC  . sodium chloride   Intravenous Once  . Chlorhexidine Gluconate Cloth  6 each Topical Daily  . epoetin (EPOGEN/PROCRIT) injection  20,000 Units Subcutaneous Weekly  . feeding supplement (NEPRO CARB STEADY)  237 mL Oral TID BM  . fludrocortisone  0.2 mg  Oral BID  . insulin aspart  0-15 Units Subcutaneous Q4H  . lactulose  20 g Oral Once  . levothyroxine  175 mcg Oral Daily  . mouth rinse  15 mL Mouth Rinse BID  . midodrine  10 mg Oral TID WC  . multivitamin with minerals  1 tablet Oral Daily  . nystatin cream   Topical BID  . pantoprazole  40 mg Oral BID  . senna  1 tablet Oral BID  . zinc oxide  1 application Topical Daily   acetaminophen **OR** acetaminophen, heparin sodium (porcine), HYDROmorphone (DILAUDID) injection, ipratropium-albuterol, ondansetron **OR** ondansetron (ZOFRAN) IV, ondansetron (ZOFRAN) IV, polyethylene glycol  Assessment/ Plan:  Mr. Andrew Valentine is a 79 y.o. 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  Baseline Creatinine 1.5  Nonoliguric urine output. 09/09 0701 - 09/10 0700 In: 682.3 [I.V.:334.2; Blood:309; IV Piggyback:39.2] Out: 1500 [Urine:1500]   - Required renal replacement therapy. CVVHDF from 8/29 to 9/2 - IHD on 9/4 - will continue close monitoring.  Serum creatinine is now decreasing  spontaneously - IV albumin for 3rd spacing of fluid -No further need for dialysis is anticipated.  Temporary dialysis catheter can be removed at any time but will keep it for safety sake over the weekend.  Can be removed on Monday.  # Hypotension Blood pressure reading in low normal range Required vasopressors during hospital stay- norepinephrine, cardiogenic and septic shock from pneumonia  Will continue midodrine And Fudrocortisone 0.2 mg twice a day   # Pneumonia Improved Patient has underlying pulmonary hypertension, obstructive sleep apnea and morbid obesity   #Anemia of chronic disease Lab Results  Component Value Date   HGB 8.2 (L) 09/21/2019    Patient has black tarry stools and stool positive for occult blood.  Endoscopy - non bleeding gastric ulcers Will continue Epogen SQ weekly (Wednesday)     LOS: 13 Mieshia Pepitone 9/10/202111:25 AM

## 2019-09-21 NOTE — Progress Notes (Signed)
Occupational Therapy Treatment Patient Details Name: Andrew Valentine MRN: [id number] DOB: [birth year] Today's Date: 09/21/2019    History of present illness Pt is a 79 year old male with a known history of morbid obesity, sleep apnea intolerant noncompliant to CPAP, hypertension, diabetes, hyperlipidemia, hypothyroidism, remote history of tobacco abuse comes to the emergency room with altered mental status and hypoxia.  Pt was on CRRT starting 09/09/19 now off CRRT with plan for PermCath placement 09/14/19.  MD assessment includes: Acute on chronic kidney failure, acute hypoxic respiratory failure, pulmonary HTN, anemia, and hypotension.   OT comments  Andrew Valentine was seen for OT/PT co-treatment on this date. Upon arrival to room pt reclined in bed agreeable to tx. Pt requires MAX A x2 to roll to L side, TOTAL A x2 for sup<>sit and scooting higher in bed. MOD A to maintain static sitting balance at EOB decreasing to MAX A for dynamic balance 2/2 posterior lean. Pt requires MAX A to don R sock and TOTAL A to don L sock at bed level. Pt completed bed level BUE THEREX: 1 set x 10 reps each (over head press, horizontal ABduction, trapeze pull ups- unable to clear shoulders from bed). Spo2 desat to 84% on 2L [place] with HoB flat for bed mobility. Resolved to 89-90% on 3L [place] (per RN okay to increase). Pt educated on ECS including PLB and pacing. Pt verbalized understanding of instruction provided. Pt making good progress toward goals. Pt continues to benefit from skilled OT services to maximize return to PLOF and minimize risk of future falls, injury, caregiver burden, and readmission. Will continue to follow POC. Discharge recommendation remains appropriate.    Follow Up Recommendations  SNF    Equipment Recommendations  Other (comment) (defer to next level of care)    Recommendations for Other Services      Precautions / Restrictions Precautions Precautions: Fall Restrictions Weight Bearing Restrictions: No        Mobility Bed Mobility Overal bed mobility: Needs Assistance Bed Mobility: Rolling;Supine to Sit;Sit to Supine Rolling: Max assist;+2 for physical assistance     Sit to supine: Total assist;+2 for physical assistance Sit to sidelying: Total assist;+2 for physical assistance    Transfers    General transfer comment: unable/unsafe to attempt    Balance Overall balance assessment: Needs assistance Sitting-balance support: Single extremity supported Sitting balance-Leahy Scale: Poor Sitting balance - Comments: MOD A to maintain sitting balance at EOB c single UE support           ADL either performed or assessed with clinical judgement   ADL Overall ADL's : Needs assistance/impaired        General ADL Comments: SETUP self-drinking at bed level. MAX A seated ADL tasks (assist for balance)                Cognition Arousal/Alertness: Awake/alert Behavior During Therapy: WFL for tasks assessed/performed Overall Cognitive Status: Within Functional Limits for tasks assessed       Exercises Exercises: Other exercises;General Upper Extremity General Exercises - Upper Extremity Shoulder Flexion: AROM;Strengthening;Both;10 reps;Supine Shoulder Horizontal ABduction: AROM;Strengthening;Both;5 reps;Supine Elbow Flexion: AROM;Strengthening;Both;10 reps;Supine Other Exercises Other Exercises: Pt educated re: importance of mobility for functional strengthening, falls prevention, ECS, BUE HEP Other Exercises: Self-drinking, sup<>sit, rolling, bed mobility, sitting balance/tolerance, BUE THEREX 1 set x 10 reps each (over head press, horizontal ABduction, trapeze pull ups)      General Comments Spo2 desat to 84% on 2L [place] c HoB flat for bed mobility. Resolved to  89-90% on 3L [place] (per RN okay to increase).     Pertinent Vitals/ Pain       Pain Assessment: Faces Faces Pain Scale: Hurts a little bit Pain Location: LLE  Pain Descriptors / Indicators: Dull;Grimacing Pain  Intervention(s): Limited activity within patient's tolerance;Repositioned         Frequency  Min 1X/week        Progress Toward Goals  OT Goals(current goals can now be found in the care plan section)  Progress towards OT goals: Progressing toward goals  Acute Rehab OT Goals Patient Stated Goal: To walk again OT Goal Formulation: With patient Time For Goal Achievement: 09/27/19 Potential to Achieve Goals: Fair ADL Goals Pt Will Perform Eating: with set-up;sitting Pt Will Perform Grooming: with set-up;sitting Pt Will Perform Upper Body Dressing: with min assist;sitting Pt/caregiver will Perform Home Exercise Program: Increased ROM;Both right and left upper extremity;With minimal assist Additional ADL Goal #1: Pt will tolerate further mobilization with therapy (at least transition to sitting with 1p assist or scoot transfers with 2p) to allow further development of OT POC.  Plan Discharge plan remains appropriate;Frequency remains appropriate    Co-evaluation    PT/OT/SLP Co-Evaluation/Treatment: Yes Reason for Co-Treatment: For patient/therapist safety;To address functional/ADL transfers PT goals addressed during session: Mobility/safety with mobility;Strengthening/ROM OT goals addressed during session: ADL's and self-care;Strengthening/ROM      AM-PAC OT "6 Clicks" Daily Activity     Outcome Measure   Help from another person eating meals?: A Little Help from another person taking care of personal grooming?: A Lot Help from another person toileting, which includes using toliet, bedpan, or urinal?: Total Help from another person bathing (including washing, rinsing, drying)?: A Lot Help from another person to put on and taking off regular upper body clothing?: A Lot Help from another person to put on and taking off regular lower body clothing?: A Lot 6 Click Score: 12    End of Session Equipment Utilized During Treatment: Oxygen  OT Visit Diagnosis: Unsteadiness on  feet (R26.81);Other abnormalities of gait and mobility (R26.89);Repeated falls (R29.6);Muscle weakness (generalized) (M62.81)   Activity Tolerance Patient tolerated treatment well   Patient Left in bed;with call bell/phone within reach   Nurse Communication Other (comment) (O2 status)        Time: 4650-3546 OT Time Calculation (min): 30 min  Charges: OT General Charges $OT Visit: 1 Visit OT Treatments $Therapeutic Exercise: 8-22 mins  Dessie Coma, M.S. OTR/L  09/21/19, 3:24 PM  ascom [phone]

## 2019-09-21 NOTE — Progress Notes (Signed)
Physical Therapy Treatment Patient Details Name: Andrew Valentine MRN: [id number] DOB: Jun 21, 1940 Today's Date: 09/21/2019    History of Present Illness Pt is a 79 year old male with a known history of morbid obesity, sleep apnea intolerant noncompliant to CPAP, hypertension, diabetes, hyperlipidemia, hypothyroidism, remote history of tobacco abuse comes to the emergency room with altered mental status and hypoxia.  Pt was on CRRT starting 09/09/19 now off CRRT with plan for PermCath placement 09/14/19.  MD assessment includes: Acute on chronic kidney failure, acute hypoxic respiratory failure, pulmonary HTN, anemia, and hypotension.    PT Comments    PT/OT co-treat for safety and pt's inability to tolerate two separate sessions. He was A and O and agreeable/motivated. Required +2 assist to achieve EOB short sit and to return to supine. Pt very deconditioned and fatigues quickly with minimal activity. Pt was on O2 throughout  and HR elevates to around 115-120 bpm in sitting. OT stayed in room after PTA finished treatment. Pt will benefit from SNF at DC to address deficits and progress safe functional mobility.      Follow Up Recommendations  SNF     Equipment Recommendations  Other (comment);3in1 (PT) (defer to next level of care)    Recommendations for Other Services       Precautions / Restrictions Precautions Precautions: Fall Restrictions Weight Bearing Restrictions: No    Mobility  Bed Mobility Overal bed mobility: Needs Assistance Bed Mobility: Rolling;Supine to Sit;Sit to Supine Rolling: Max assist;+2 for physical assistance Sidelying to sit: Total assist;+2 for physical assistance Supine to sit: Total assist;+2 for physical assistance;+2 for safety/equipment Sit to supine: Total assist;+2 for physical assistance;+2 for safety/equipment Sit to sidelying: Total assist;+2 for physical assistance General bed mobility comments: Pt requires total assist to roll L to short sit. He sat EOB  x ~ 5 minutes prior to verbilizing fatigue and requesting to return to supine. did perform minimal LE ther ex a EOB. C/O L knee pain (arthritis)  Transfers      General transfer comment: unsafe to trial transfers  Ambulation/Gait    General Gait Details: unsafe to progress at this time      Balance Overall balance assessment: Needs assistance Sitting-balance support: Single extremity supported;Feet supported Sitting balance-Leahy Scale: Poor Sitting balance - Comments: MOD A to maintain sitting balance at EOB c single UE support           Cognition Arousal/Alertness: Awake/alert Behavior During Therapy: WFL for tasks assessed/performed Overall Cognitive Status: Within Functional Limits for tasks assessed              General Comments: Pt is A and O and agreeable to PT/OT cotreat. required cotraet 2/2 to pt's complexity and requiring > +1 assistance for all functional activities      Exercises General Exercises - Upper Extremity Shoulder Flexion: AROM;Strengthening;Both;10 reps;Supine Shoulder Horizontal ABduction: AROM;Strengthening;Both;5 reps;Supine Elbow Flexion: AROM;Strengthening;Both;10 reps;Supine Other Exercises Other Exercises: Pt educated re: importance of mobility for functional strengthening, falls prevention, ECS, BUE HEP Other Exercises: Self-drinking, sup<>sit, rolling, bed mobility, sitting balance/tolerance, BUE THEREX 1 set x 10 reps each (over head press, horizontal ABduction, trapeze pull ups)    General Comments General comments (skin integrity, edema, etc.): Spo2 desat to 84% on 2L [place] c HoB flat for bed mobility. Resolved to 89-90% on 3L [place] (per RN okay to increase).       Pertinent Vitals/Pain Pain Assessment: Faces Faces Pain Scale: Hurts a little bit Pain Location: LLE  (knee) Pain Descriptors /  Indicators: Dull;Grimacing Pain Intervention(s): Limited activity within patient's tolerance;Repositioned;Monitored during session            PT Goals (current goals can now be found in the care plan section) Acute Rehab PT Goals Patient Stated Goal: To return to PLOF Progress towards PT goals: Progressing toward goals    Frequency    Min 2X/week      PT Plan Current plan remains appropriate    Co-evaluation   Reason for Co-Treatment: Complexity of the patient's impairments (multi-system involvement);Necessary to address cognition/behavior during functional activity;For patient/therapist safety;To address functional/ADL transfers PT goals addressed during session: Mobility/safety with mobility;Balance;Strengthening/ROM OT goals addressed during session: ADL's and self-care;Strengthening/ROM      AM-PAC PT "6 Clicks" Mobility   Outcome Measure  Help needed turning from your back to your side while in a flat bed without using bedrails?: A Lot Help needed moving from lying on your back to sitting on the side of a flat bed without using bedrails?: Total Help needed moving to and from a bed to a chair (including a wheelchair)?: Total Help needed standing up from a chair using your arms (e.g., wheelchair or bedside chair)?: Total Help needed to walk in hospital room?: Total Help needed climbing 3-5 steps with a railing? : Total 6 Click Score: 7    End of Session Equipment Utilized During Treatment: Oxygen Activity Tolerance: Patient tolerated treatment well;Patient limited by fatigue Patient left: in bed;with call bell/phone within reach;with bed alarm set;with SCD's reapplied Nurse Communication: Mobility status PT Visit Diagnosis: History of falling (Z91.81);Difficulty in walking, not elsewhere classified (R26.2);Muscle weakness (generalized) (M62.81)     Time: 2122-4825 PT Time Calculation (min) (ACUTE ONLY): 17 min  Charges:  $Therapeutic Activity: 8-22 mins                     Julaine Fusi PTA 09/21/19, 3:46 PM

## 2019-09-21 NOTE — Progress Notes (Signed)
PROGRESS NOTE    Andrew Valentine  LKG:[id number] DOB: 11-30-1940 DOA: 09/08/2019 PCP: [facility]  Chief complaint.  Shortness of breath.  Brief Narrative:  58M hx of Morbid obesity BMI >50, CHF, DM, hypothoroidism, came from home due to AKI. He lives alone and cannot take care of himself. He was noted to be lethargic in ED with hypercapnia and hypoxemia. Daughter she states she came to check on him and blood glucose was 47, he was unable to get out of chair. He was weak could not walk, desaturating 70s. He was encephalopathic at this time. Daughter lives next door.Was admitted to ICU initially. Patient had a significant weight gain over the last 2 weeks. He has anasarca. He was placed on CRRT. 09/10/19- patient is awake this am, he on levphed 25, hes on CRRT with dyasylate modifcation this am by renal team. S/p wound care eval, UOP has improved.  8/31- patient weaned off vasopressin, weaned levophed from 25>>10.  9/1- patient was evaluated by PALS, he does not wish to have permanent dialysis. Code status discussion in progress.  9/2- Plan to remove Left IJ HD catheter, trial of Lasix as per Nephrology 9/3- patient reports feeling well, he ate dinner states he feels well. S/p vascular eval for perm cath placement. 1 unit prbc transfustion ordered, heparin stopped due to borderline low h/h 9/4 medical therapy  9/8.Patient had a significant black stool, positive occult blood. Change Protonix to 40 mg IV twice a day, GI consult. He has been making large amount of urine, has not been dialyzed since 9/4.  9/9.  EGD showed multiple gastric ulcers.  No active bleeding.  Hemoglobin dropped down to 7.0, transfuse 1 unit PRBC.  9/10.  Hemoglobin stable, creatinine better at 2.75.   Assessment & Plan:   Active Problems:   Acute hypoxemic respiratory failure (HCC)   Acute renal failure (HCC)   Hyperkalemia   NSTEMI (non-ST elevated myocardial infarction) (HCC)    Pressure injury of skin   Pulmonary hypertension, unspecified (HCC)   Persistent atrial fibrillation (HCC)   Anemia   Acute right-sided CHF (congestive heart failure) ([place])   Acute blood loss anemia  #1.  Acute hypoxemic respiratory failure. Condition much improved today.  Continue to wean oxygen.  2.  Acute kidney injury on chronic kidney disease stage IIIb. Renal function finally improving.  Patient is making significant amount of urine.  Temporary dialysis catheter still in place.  Has not been requiring dialysis.  Followed by nephrology.  3.  Acute right-sided congestive heart failure with anasarca and severe pulmonary hypertension. Condition improved.  Patient still making large amount of urine with recovery of renal failure.  4.  Acute blood loss anemia secondary to upper GI bleed secondary to gastric ulcers. Condition has been stabilized.  No active bleeding on EGD.  PPI changed to oral Protonix twice a day.  5.  Hypotension. Blood pressure is normal now.  6.  Sleep apnea.  Continue CPAP.    DVT prophylaxis: SCDs Code Status: DNR Family Communication: Discussed with patient son.  .   Status is: Inpatient  Remains inpatient appropriate because:Inpatient level of care appropriate due to severity of illness   Dispo: The patient is from: Home              Anticipated d/c is to: SNF              Anticipated d/c date is: 2 days  Patient currently is not medically stable to d/c.        I/O last 3 completed shifts: In: 856.5 [I.V.:418.3; Blood:309; IV Piggyback:129.2] Out: 0623 [Urine:1650] No intake/output data recorded.     Consultants:   Nephrology  Procedures: None  Antimicrobials: None  Subjective: Patient doing better today.  Still on 2 L oxygen, no significant short of breath.  Good appetite, making significant amount of urine. Has some constipation.  No abdominal pain or nausea vomiting. No fever or chills.  Objective: Vitals:    09/20/19 1749 09/20/19 1937 09/20/19 2357 09/21/19 0733  BP: 132/73 124/68 (!) 145/74 138/72  Pulse: 83 90 85 88  Resp: 16 18 20 16   Temp: 97.8 F (36.6 C) 97.9 F (36.6 C) 97.8 F (36.6 C) 97.8 F (36.6 C)  TempSrc: Oral Oral Oral Oral  SpO2: 100% 96% 96% 93%  Weight:      Height:        Intake/Output Summary (Last 24 hours) at 09/21/2019 1047 Last data filed at 09/21/2019 0600 Gross per 24 hour  Intake 682.33 ml  Output 1500 ml  Net -817.67 ml   Filed Weights   09/18/19 0631 09/19/19 0637 09/20/19 1252  Weight: (!) 161.6 kg (!) 159.9 kg (!) 159.9 kg    Examination:  General exam: Appears calm and comfortable  Respiratory system: A few crackles in the base. Respiratory effort normal. Cardiovascular system: S1 & S2 heard, RRR. No JVD, murmurs, rubs, gallops or clicks. 1+ pedal edema. Gastrointestinal system: Abdomen is nondistended, soft and nontender. No organomegaly or masses felt. Normal bowel sounds heard. Central nervous system: Alert and oriented. No focal neurological deficits. Extremities: Symmetric  Skin: No rashes, lesions or ulcers Psychiatry: . Mood & affect appropriate.     Data Reviewed: I have personally reviewed following labs and imaging studies  CBC: Recent Labs  Lab 09/16/19 1106 09/17/19 0803 09/18/19 0449 09/20/19 0725 09/21/19 0524  WBC 14.3* 13.9* 11.9* 9.2 12.8*  NEUTROABS  --   --   --  6.9 10.7*  HGB 7.5* 7.7* 7.5* 7.0* 8.2*  HCT 23.4* 23.5* 22.7* 23.1* 26.5*  MCV 98.3 96.7 97.0 101.8* 100.8*  PLT 129* 126* 122* 109* 762*   Basic Metabolic Panel: Recent Labs  Lab 09/16/19 1106 09/16/19 1106 09/17/19 0803 09/18/19 0449 09/19/19 0537 09/20/19 0725 09/21/19 0524  NA 137  --  139 139  --  142 145  K 3.7  --  3.8 3.6  --  3.4* 3.8  CL 102  --  105 103  --  104 108  CO2 28  --  29 29  --  30 31  GLUCOSE 180*  --  165* 165*  --  132* 168*  BUN 77*  --  81* 85*  --  88* 80*  CREATININE 2.44*   < > 2.78* 3.06* 3.08* 3.07* 2.75*    CALCIUM 8.1*  --  8.3* 8.4*  --  8.4* 8.3*  MG 1.8   < > 2.1 2.0 2.2 2.3 2.3  PHOS 3.6   < > 4.4 3.8 4.2 4.0 3.9   < > = values in this interval not displayed.   GFR: Estimated Creatinine Clearance: 32.3 mL/min (A) (by C-G formula based on SCr of 2.75 mg/dL (H)). Liver Function Tests: No results for input(s): AST, ALT, ALKPHOS, BILITOT, PROT, ALBUMIN in the last 168 hours. No results for input(s): LIPASE, AMYLASE in the last 168 hours. No results for input(s): AMMONIA in the last 168 hours. Coagulation  Profile: No results for input(s): INR, PROTIME in the last 168 hours. Cardiac Enzymes: No results for input(s): CKTOTAL, CKMB, CKMBINDEX, TROPONINI in the last 168 hours. BNP (last 3 results) No results for input(s): PROBNP in the last 8760 hours. HbA1C: No results for input(s): HGBA1C in the last 72 hours. CBG: Recent Labs  Lab 09/20/19 1631 09/20/19 2017 09/20/19 2358 09/21/19 0410 09/21/19 0736  GLUCAP 138* 214* 202* 154* 156*   Lipid Profile: No results for input(s): CHOL, HDL, LDLCALC, TRIG, CHOLHDL, LDLDIRECT in the last 72 hours. Thyroid Function Tests: No results for input(s): TSH, T4TOTAL, FREET4, T3FREE, THYROIDAB in the last 72 hours. Anemia Panel: No results for input(s): VITAMINB12, FOLATE, FERRITIN, TIBC, IRON, RETICCTPCT in the last 72 hours. Sepsis Labs: No results for input(s): PROCALCITON, LATICACIDVEN in the last 168 hours.  No results found for this or any previous visit (from the past 240 hour(s)).       Radiology Studies: No results found.      Scheduled Meds: . (feeding supplement) PROSource Plus  30 mL Oral TID WC  . sodium chloride   Intravenous Once  . Chlorhexidine Gluconate Cloth  6 each Topical Daily  . epoetin (EPOGEN/PROCRIT) injection  20,000 Units Subcutaneous Weekly  . feeding supplement (NEPRO CARB STEADY)  237 mL Oral TID BM  . fludrocortisone  0.2 mg Oral BID  . insulin aspart  0-15 Units Subcutaneous Q4H  . levothyroxine   175 mcg Oral Daily  . mouth rinse  15 mL Mouth Rinse BID  . midodrine  10 mg Oral TID WC  . multivitamin with minerals  1 tablet Oral Daily  . nystatin cream   Topical BID  . pantoprazole  40 mg Oral BID  . senna  1 tablet Oral BID  . zinc oxide  1 application Topical Daily   Continuous Infusions: . albumin human Stopped (09/20/19 0927)     LOS: 13 days    Time spent: 28 minutes    Sharen Hones, MD [facility]   To contact the attending provider between 7A-7P or the covering provider during after hours 7P-7A, please log into the web site [email or web address] and access using universal [place] password for that web site. If you do not have the password, please call the hospital operator.  09/21/2019, 10:47 AM

## 2019-09-21 NOTE — Care Management Important Message (Signed)
Important Message  Patient Details  Name: Andrew Valentine MRN: [id number] Date of Birth: 1940-05-17   Medicare Important Message Given:  Yes     Dannette Barbara 09/21/2019, 11:05 AM

## 2019-09-22 LAB — BASIC METABOLIC PANEL
Anion gap: 11 (ref 5–15)
BUN: 78 mg/dL — ABNORMAL HIGH (ref 8–23)
CO2: 29 mmol/L (ref 22–32)
Calcium: 8.2 mg/dL — ABNORMAL LOW (ref 8.9–10.3)
Chloride: 102 mmol/L (ref 98–111)
Creatinine, Ser: 3.12 mg/dL — ABNORMAL HIGH (ref 0.61–1.24)
GFR calc Af Amer: 21 mL/min — ABNORMAL LOW (ref >60)
GFR calc non Af Amer: 18 mL/min — ABNORMAL LOW (ref >60)
Glucose, Bld: 167 mg/dL — ABNORMAL HIGH (ref 70–99)
Potassium: 3.6 mmol/L (ref 3.5–5.1)
Sodium: 142 mmol/L (ref 135–145)

## 2019-09-22 LAB — CBC WITH DIFFERENTIAL/PLATELET
Abs Immature Granulocytes: 0.05 10*3/uL (ref 0.00–0.07)
Basophils Absolute: 0 10*3/uL (ref 0.0–0.1)
Basophils Relative: 0 %
Eosinophils Absolute: 0.5 10*3/uL (ref 0.0–0.5)
Eosinophils Relative: 4 %
HCT: 25 % — ABNORMAL LOW (ref 39.0–52.0)
Hemoglobin: 7.7 g/dL — ABNORMAL LOW (ref 13.0–17.0)
Immature Granulocytes: 0 %
Lymphocytes Relative: 5 %
Lymphs Abs: 0.7 10*3/uL (ref 0.7–4.0)
MCH: 30.9 pg (ref 26.0–34.0)
MCHC: 30.8 g/dL (ref 30.0–36.0)
MCV: 100.4 fL — ABNORMAL HIGH (ref 80.0–100.0)
Monocytes Absolute: 0.8 10*3/uL (ref 0.1–1.0)
Monocytes Relative: 6 %
Neutro Abs: 11.5 10*3/uL — ABNORMAL HIGH (ref 1.7–7.7)
Neutrophils Relative %: 85 %
Platelets: 97 10*3/uL — ABNORMAL LOW (ref 150–400)
RBC: 2.49 MIL/uL — ABNORMAL LOW (ref 4.22–5.81)
RDW: 18.6 % — ABNORMAL HIGH (ref 11.5–15.5)
WBC: 13.5 10*3/uL — ABNORMAL HIGH (ref 4.0–10.5)
nRBC: 0 % (ref 0.0–0.2)

## 2019-09-22 LAB — GLUCOSE, CAPILLARY
Glucose-Capillary: 138 mg/dL — ABNORMAL HIGH (ref 70–99)
Glucose-Capillary: 135 mg/dL — ABNORMAL HIGH (ref 70–99)
Glucose-Capillary: 159 mg/dL — ABNORMAL HIGH (ref 70–99)
Glucose-Capillary: 161 mg/dL — ABNORMAL HIGH (ref 70–99)
Glucose-Capillary: 175 mg/dL — ABNORMAL HIGH (ref 70–99)

## 2019-09-22 LAB — PHOSPHORUS: Phosphorus: 4.3 mg/dL (ref 2.5–4.6)

## 2019-09-22 LAB — MAGNESIUM: Magnesium: 2.1 mg/dL (ref 1.7–2.4)

## 2019-09-22 MED ORDER — CITALOPRAM HYDROBROMIDE 10 MG/5ML PO SOLN
10.0000 mg | Freq: Every day | ORAL | Status: DC
  Administered 2019-09-22 – 2019-09-27 (×6): 10 mg via ORAL
  Filled 2019-09-22: qty 10
  Filled 2019-09-22: qty 5
  Filled 2019-09-22: qty 10
  Filled 2019-09-22 (×5): qty 5

## 2019-09-22 MED ORDER — INSULIN ASPART 100 UNIT/ML ~~LOC~~ SOLN
0.0000 [IU] | Freq: Every day | SUBCUTANEOUS | Status: DC
  Administered 2019-09-22 – 2019-09-26 (×2): 2 [IU] via SUBCUTANEOUS
  Filled 2019-09-22 (×2): qty 1

## 2019-09-22 MED ORDER — ALBUMIN HUMAN 25 % IV SOLN
12.5000 g | Freq: Once | INTRAVENOUS | Status: AC
  Administered 2019-09-22: 12.5 g via INTRAVENOUS
  Filled 2019-09-22: qty 50

## 2019-09-22 MED ORDER — INSULIN ASPART 100 UNIT/ML ~~LOC~~ SOLN
0.0000 [IU] | Freq: Three times a day (TID) | SUBCUTANEOUS | Status: DC
  Administered 2019-09-22 (×2): 2 [IU] via SUBCUTANEOUS
  Administered 2019-09-23: 1 [IU] via SUBCUTANEOUS
  Administered 2019-09-23: 5 [IU] via SUBCUTANEOUS
  Administered 2019-09-23: 2 [IU] via SUBCUTANEOUS
  Administered 2019-09-24: 1 [IU] via SUBCUTANEOUS
  Administered 2019-09-24 – 2019-09-26 (×4): 2 [IU] via SUBCUTANEOUS
  Administered 2019-09-27 (×2): 1 [IU] via SUBCUTANEOUS
  Administered 2019-09-27: 2 [IU] via SUBCUTANEOUS
  Administered 2019-09-28: 1 [IU] via SUBCUTANEOUS
  Filled 2019-09-22 (×15): qty 1

## 2019-09-22 NOTE — Progress Notes (Signed)
[facility]  ROUNDING NOTE   Subjective:   09/10 0701 - 09/11 0700 In: -  Out: 2000 [Urine:2000]  Patient resting comfortably in bed. Making good urine.   Objective:  Vital signs in last 24 hours:  Temp:  [97.7 F (36.5 C)-98.7 F (37.1 C)] 97.9 F (36.6 C) (09/11 0749) Pulse Rate:  [82-86] 82 (09/11 0749) Resp:  [17-20] 19 (09/11 0749) BP: (109-141)/(56-76) 109/61 (09/11 0749) SpO2:  [84 %-95 %] 95 % (09/11 0749)  Weight change:  Filed Weights   09/18/19 0631 09/19/19 0637 09/20/19 1252  Weight: (!) 161.6 kg (!) 159.9 kg (!) 159.9 kg    Intake/Output: I/O last 3 completed shifts: In: 427.3 [I.V.:123.2; Blood:265; IV Piggyback:39.2] Out: 3000 [QPRFF:6384]   Intake/Output this shift:  Total I/O In: 91.2 [IV Piggyback:91.2] Out: -   Physical Exam: General: In no acute distress  Head: Oral mucosal membranes moist  Lungs:  Lungs diminished at the bases  Heart: S1S2, ,irregular  Abdomen:  Non distended  Extremities: 1+ bilateral lower extremity edema  Neurologic: Alert,oriented  Skin: No new lesions or rashes  Access: LIJ temp HD catheter    Basic Metabolic Panel: Recent Labs  Lab 09/17/19 0803 09/17/19 0803 09/18/19 0449 09/18/19 0449 09/19/19 0537 09/20/19 0725 09/21/19 0524 09/22/19 1047  NA 139  --  139  --   --  142 145 142  K 3.8  --  3.6  --   --  3.4* 3.8 3.6  CL 105  --  103  --   --  104 108 102  CO2 29  --  29  --   --  30 31 29   GLUCOSE 165*  --  165*  --   --  132* 168* 167*  BUN 81*  --  85*  --   --  88* 80* 78*  CREATININE 2.78*   < > 3.06*  --  3.08* 3.07* 2.75* 3.12*  CALCIUM 8.3*   < > 8.4*   < >  --  8.4* 8.3* 8.2*  MG 2.1   < > 2.0  --  2.2 2.3 2.3 2.1  PHOS 4.4   < > 3.8  --  4.2 4.0 3.9 4.3   < > = values in this interval not displayed.    Liver Function Tests: No results for input(s): AST, ALT, ALKPHOS, BILITOT, PROT, ALBUMIN in the last 168 hours. No results for input(s): LIPASE, AMYLASE in the last 168  hours. No results for input(s): AMMONIA in the last 168 hours.  CBC: Recent Labs  Lab 09/17/19 0803 09/18/19 0449 09/20/19 0725 09/21/19 0524 09/22/19 1047  WBC 13.9* 11.9* 9.2 12.8* 13.5*  NEUTROABS  --   --  6.9 10.7* 11.5*  HGB 7.7* 7.5* 7.0* 8.2* 7.7*  HCT 23.5* 22.7* 23.1* 26.5* 25.0*  MCV 96.7 97.0 101.8* 100.8* 100.4*  PLT 126* 122* 109* 105* 97*    Cardiac Enzymes: No results for input(s): CKTOTAL, CKMB, CKMBINDEX, TROPONINI in the last 168 hours.  BNP: Invalid input(s): POCBNP  CBG: Recent Labs  Lab 09/21/19 2008 09/21/19 2318 09/22/19 0351 09/22/19 0750 09/22/19 1208  GLUCAP 148* 141* 138* 135* 159*    Microbiology: Results for orders placed or performed during the hospital encounter of 09/08/19  MRSA PCR Screening     Status: None   Collection Time: 09/08/19 11:49 AM   Specimen: Nasopharyngeal  Result Value Ref Range Status   MRSA by PCR NEGATIVE NEGATIVE Final    Comment:  The GeneXpert MRSA Assay (FDA approved for NASAL specimens only), is one component of a comprehensive MRSA colonization surveillance program. It is not intended to diagnose MRSA infection nor to guide or monitor treatment for MRSA infections. Performed at [facility], [address] [place], [place] [address]   SARS Coronavirus 2 by RT PCR (hospital order, performed in [facility] hospital lab) Nasopharyngeal Nasopharyngeal Swab     Status: None   Collection Time: 09/08/19 12:05 PM   Specimen: Nasopharyngeal Swab  Result Value Ref Range Status   SARS Coronavirus 2 NEGATIVE NEGATIVE Final    Comment: (NOTE) SARS-CoV-2 target nucleic acids are NOT DETECTED.  The SARS-CoV-2 RNA is generally detectable in upper and lower respiratory specimens during the acute phase of infection. The lowest concentration of SARS-CoV-2 viral copies this assay can detect is 250 copies / mL. A negative result does not preclude SARS-CoV-2 infection and should not be used as the  sole basis for treatment or other patient management decisions.  A negative result may occur with improper specimen collection / handling, submission of specimen other than nasopharyngeal swab, presence of viral mutation(s) within the areas targeted by this assay, and inadequate number of viral copies (<250 copies / mL). A negative result must be combined with clinical observations, patient history, and epidemiological information.  Fact Sheet for Patients:   StrictlyIdeas.no  Fact Sheet for Healthcare Providers: BankingDealers.co.za  This test is not yet approved or  cleared by the [place] FDA and has been authorized for detection and/or diagnosis of SARS-CoV-2 by FDA under an Emergency Use Authorization (EUA).  This EUA will remain in effect (meaning this test can be used) for the duration of the COVID-19 declaration under Section 564(b)(1) of the Act, 21 U.S.C. section 360bbb-3(b)(1), unless the authorization is terminated or revoked sooner.  Performed at [facility], [address] [place], [place] [address]   Blood culture (routine x 2)     Status: Abnormal   Collection Time: 09/08/19 12:07 PM   Specimen: BLOOD  Result Value Ref Range Status   Specimen Description   Final    BLOOD L DISTAL FOREARM Performed at [facility], [address] [place], [place] [address]    Special Requests   Final    BOTTLES DRAWN AEROBIC AND ANAEROBIC Blood Culture results may not be optimal due to an excessive volume of blood received in culture bottles Performed at [facility], [address], [place], [place] [address]    Culture  Setup Time   Final    GRAM POSITIVE COCCI AEROBIC BOTTLE ONLY CRITICAL RESULT CALLED TO, READ BACK BY AND VERIFIED WITH: MORGAN CUNNINGHAM  AT [id number] ON 09/09/19 SNG    Culture (A)  Final    STAPHYLOCOCCUS EPIDERMIDIS THE SIGNIFICANCE OF ISOLATING THIS ORGANISM FROM A SINGLE  SET OF BLOOD CULTURES WHEN MULTIPLE SETS ARE DRAWN IS UNCERTAIN. PLEASE NOTIFY THE MICROBIOLOGY DEPARTMENT WITHIN ONE WEEK IF SPECIATION AND SENSITIVITIES ARE REQUIRED. Performed at [facility], [address] [place],  [address]    Report Status 09/11/2019 FINAL  Final  Blood Culture ID Panel (Reflexed)     Status: Abnormal   Collection Time: 09/08/19 12:07 PM  Result Value Ref Range Status   Enterococcus faecalis NOT DETECTED NOT DETECTED Final   Enterococcus Faecium NOT DETECTED NOT DETECTED Final   Listeria monocytogenes NOT DETECTED NOT DETECTED Final   Staphylococcus species DETECTED (A) NOT DETECTED Final    Comment: CRITICAL RESULT  CALLED TO, READ BACK BY AND VERIFIED WITH: MORGAN CUNNINGHAM AT 9379 ON 09/09/19 SNG    Staphylococcus aureus (BCID) NOT DETECTED NOT DETECTED Final   Staphylococcus epidermidis DETECTED (A) NOT DETECTED Final    Comment: CRITICAL RESULT CALLED TO, READ BACK BY AND VERIFIED WITH: MORGAN CUNNINGHAM AT 1345 ON 09/09/19 SNG    Staphylococcus lugdunensis NOT DETECTED NOT DETECTED Final   Streptococcus species NOT DETECTED NOT DETECTED Final   Streptococcus agalactiae NOT DETECTED NOT DETECTED Final   Streptococcus pneumoniae NOT DETECTED NOT DETECTED Final   Streptococcus pyogenes NOT DETECTED NOT DETECTED Final   A.calcoaceticus-baumannii NOT DETECTED NOT DETECTED Final   Bacteroides fragilis NOT DETECTED NOT DETECTED Final   Enterobacterales NOT DETECTED NOT DETECTED Final   Enterobacter cloacae complex NOT DETECTED NOT DETECTED Final   Escherichia coli NOT DETECTED NOT DETECTED Final   Klebsiella aerogenes NOT DETECTED NOT DETECTED Final   Klebsiella oxytoca NOT DETECTED NOT DETECTED Final   Klebsiella pneumoniae NOT DETECTED NOT DETECTED Final   Proteus species NOT DETECTED NOT DETECTED Final   Salmonella species NOT DETECTED NOT DETECTED Final   Serratia marcescens NOT DETECTED NOT DETECTED Final   Haemophilus influenzae NOT  DETECTED NOT DETECTED Final   Neisseria meningitidis NOT DETECTED NOT DETECTED Final   Pseudomonas aeruginosa NOT DETECTED NOT DETECTED Final   Stenotrophomonas maltophilia NOT DETECTED NOT DETECTED Final   Candida albicans NOT DETECTED NOT DETECTED Final   Candida auris NOT DETECTED NOT DETECTED Final   Candida glabrata NOT DETECTED NOT DETECTED Final   Candida krusei NOT DETECTED NOT DETECTED Final   Candida parapsilosis NOT DETECTED NOT DETECTED Final   Candida tropicalis NOT DETECTED NOT DETECTED Final   Cryptococcus neoformans/gattii NOT DETECTED NOT DETECTED Final   Methicillin resistance mecA/C NOT DETECTED NOT DETECTED Final    Comment: Performed at [facility], [address] [place], [place] [address]  Blood culture (routine x 2)     Status: None   Collection Time: 09/08/19 12:59 PM   Specimen: BLOOD  Result Value Ref Range Status   Specimen Description BLOOD LEFT UPPER FORARM  Final   Special Requests   Final    BOTTLES DRAWN AEROBIC AND ANAEROBIC Blood Culture results may not be optimal due to an excessive volume of blood received in culture bottles   Culture   Final    NO GROWTH 5 DAYS Performed at [facility], [address] [place], [place] [address]    Report Status 09/13/2019 FINAL  Final    Coagulation Studies: No results for input(s): LABPROT, INR in the last 72 hours.  Urinalysis: No results for input(s): COLORURINE, LABSPEC, PHURINE, GLUCOSEU, HGBUR, BILIRUBINUR, KETONESUR, PROTEINUR, UROBILINOGEN, NITRITE, LEUKOCYTESUR in the last 72 hours.  Invalid input(s): APPERANCEUR    Imaging: No results found.   Medications:   . albumin human 12.5 g (09/22/19 0913)   . (feeding supplement) PROSource Plus  30 mL Oral TID WC  . sodium chloride   Intravenous Once  . Chlorhexidine Gluconate Cloth  6 each Topical Daily  . citalopram  10 mg Oral Daily  . epoetin (EPOGEN/PROCRIT) injection  20,000 Units Subcutaneous Weekly  . feeding  supplement (NEPRO CARB STEADY)  237 mL Oral TID BM  . fludrocortisone  0.2 mg Oral BID  . insulin aspart  0-5 Units Subcutaneous QHS  . insulin aspart  0-9 Units Subcutaneous TID WC  . levothyroxine  175 mcg Oral Daily  . mouth rinse  15 mL Mouth Rinse  BID  . midodrine  10 mg Oral TID WC  . multivitamin with minerals  1 tablet Oral Daily  . nystatin cream   Topical BID  . pantoprazole  40 mg Oral BID  . senna  1 tablet Oral BID  . zinc oxide  1 application Topical Daily   acetaminophen **OR** acetaminophen, heparin sodium (porcine), HYDROmorphone (DILAUDID) injection, ipratropium-albuterol, ondansetron **OR** ondansetron (ZOFRAN) IV, ondansetron (ZOFRAN) IV, polyethylene glycol  Assessment/ Plan:  Mr. Andrew Valentine is a 79 y.o. 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  Baseline Creatinine 1.5  Nonoliguric urine output. 09/10 0701 - 09/11 0700 In: -  Out: 2000 [Urine:2000]   - Required renal replacement therapy. CVVHDF from 8/29 to 9/2 - IHD on 9/4 -Good urine output noted.  Creatinine currently 2.8.  We will plan to remove temporary dialysis catheter on Monday.  # Hypotension Blood pressure reading in low normal range Required vasopressors during hospital stay- norepinephrine, cardiogenic and septic shock from pneumonia Maintain the patient on midodrine as well  as fludrocortisone 0.2 mg twice daily.   #Bacterial pneumonia Improved Patient has underlying pulmonary hypertension, obstructive sleep apnea and morbid obesity.  Previously treated.   #Anemia of chronic disease Lab Results  Component Value Date   HGB 7.7 (L) 09/22/2019    Patient has black tarry stools and stool positive for occult blood.  Endoscopy - non bleeding gastric ulcers Maintain the patient on Epogen.     LOS: 14 Janaya Broy 9/11/202112:25 PM

## 2019-09-22 NOTE — Progress Notes (Signed)
PROGRESS NOTE    Andrew Valentine  CBS:[id number] DOB: 1940-08-03 DOA: 09/08/2019 PCP: [facility]   Chief complaint.  Shortness of breath. Brief Narrative:  50M hx of Morbid obesity BMI >50, CHF, DM, hypothoroidism, came from home due to AKI. He lives alone and cannot take care of himself. He was noted to be lethargic in ED with hypercapnia and hypoxemia. Daughter she states she came to check on him and blood glucose was 47, he was unable to get out of chair. He was weak could not walk, desaturating 70s. He was encephalopathic at this time. Daughter lives next door.Was admitted to ICU initially. Patient had a significant weight gain over the last 2 weeks. He has anasarca. He was placed on CRRT. 09/10/19- patient is awake this am, he on levphed 25, hes on CRRT with dyasylate modifcation this am by renal team. S/p wound care eval, UOP has improved.  8/31- patient weaned off vasopressin, weaned levophed from 25>>10.  9/1- patient was evaluated by PALS, he does not wish to have permanent dialysis. Code status discussion in progress.  9/2- Plan to remove Left IJ HD catheter, trial of Lasix as per Nephrology 9/3- patient reports feeling well, he ate dinner states he feels well. S/p vascular eval for perm cath placement. 1 unit prbc transfustion ordered, heparin stopped due to borderline low h/h 9/4 medical therapy  9/8.Patient had a significant black stool, positive occult blood. Change Protonix to 40 mg IV twice a day, GI consult. He has been making large amount of urine, has not been dialyzed since 9/4.  9/9.EGD showed multiple gastric ulcers. No active bleeding. Hemoglobin dropped down to 7.0, transfuse 1 unit PRBC.  9/10.  Hemoglobin stable, creatinine better at 2.75.   Assessment & Plan:   Active Problems:   Acute hypoxemic respiratory failure (HCC)   Acute renal failure (HCC)   Hyperkalemia   NSTEMI (non-ST elevated myocardial infarction) (HCC)    Pressure injury of skin   Pulmonary hypertension, unspecified (HCC)   Persistent atrial fibrillation (HCC)   Anemia   Acute right-sided CHF (congestive heart failure) ([place])   Acute blood loss anemia  #1.  Acute hypoxemic respiratory failure. Condition stable.  Wean oxygen.  2.  Acute kidney injury on chronic kidney disease stage IIIb. Patient still have a Foley catheter and temporary dialysis catheter.  Renal function seem to be improving, however slightly worsening renal function today after he started diuretics.  Will give extra dose of albumin, discontinue diuretics.  3.  Acute right-sided congestive heart failure with anasarca and a severe pulmonary hypertension. Patient not tolerating diuretics.  Condition relatively stable.  4.  Acute blood loss anemia secondary to upper GI bleed secondary to gastric ulcers. Appear to be secondary to NSAIDs. Condition has been stable.  No active bleeding.  Hemoglobin today 7.7, recheck hemoglobin tomorrow, transfuse as needed.  5.  Hypotension. Blood pressure is better now.  6.  Obstructive sleep apnea.  Continue CPAP while asleep.  7.  Morbid obesity.  8.  Severe debility. Followed by physical therapy occupational therapy.  Pending nursing home placement.      DVT prophylaxis: SCDs Code Status: DNR Family Communication: Son updated.  .   Status is: Inpatient  Remains inpatient appropriate because:Inpatient level of care appropriate due to severity of illness   Dispo: The patient is from: Home              Anticipated d/c is to: SNF  Anticipated d/c date is: 2 days              Patient currently is not medically stable to d/c.        I/O last 3 completed shifts: In: 427.3 [I.V.:123.2; Blood:265; IV Piggyback:39.2] Out: 3000 [Urine:3000] Total I/O In: 91.2 [IV Piggyback:91.2] Out: -      Consultants:   Nephrology  Procedures: None  Antimicrobials: None  Subjective: Patient still very weak,  could not stand with a physical therapist.  Short of breath is improving.  No cough. No abdominal pain or nausea vomiting. Still have Foley catheter in for monitor I&O's.  Objective: Vitals:   09/21/19 1603 09/22/19 0013 09/22/19 0540 09/22/19 0749  BP: (!) 141/76 (!) 120/56 120/63 109/61  Pulse: 86 83 84 82  Resp: 20 17 19 19   Temp: 98.1 F (36.7 C) 98.7 F (37.1 C) 97.7 F (36.5 C) 97.9 F (36.6 C)  TempSrc: Oral Oral Oral Oral  SpO2: 92% (!) 84% 92% 95%  Weight:      Height:        Intake/Output Summary (Last 24 hours) at 09/22/2019 1144 Last data filed at 09/22/2019 1009 Gross per 24 hour  Intake 91.23 ml  Output 2000 ml  Net -1908.77 ml   Filed Weights   09/18/19 0631 09/19/19 0637 09/20/19 1252  Weight: (!) 161.6 kg (!) 159.9 kg (!) 159.9 kg    Examination:  General exam: Appears calm and comfortable, morbid obese. Respiratory system: Decreased breathing sounds.  Respiratory effort normal. Cardiovascular system: S1 & S2 heard, RRR. No JVD, murmurs, rubs, gallops or clicks. 1+ pedal edema. Gastrointestinal system: Abdomen is nondistended, soft and nontender. No organomegaly or masses felt. Normal bowel sounds heard. Central nervous system: Alert and oriented. No focal neurological deficits. Extremities: Symmetric  Skin: No rashes, lesions or ulcers Psychiatry:  Mood & affect appropriate.     Data Reviewed: I have personally reviewed following labs and imaging studies  CBC: Recent Labs  Lab 09/17/19 0803 09/18/19 0449 09/20/19 0725 09/21/19 0524 09/22/19 1047  WBC 13.9* 11.9* 9.2 12.8* 13.5*  NEUTROABS  --   --  6.9 10.7* 11.5*  HGB 7.7* 7.5* 7.0* 8.2* 7.7*  HCT 23.5* 22.7* 23.1* 26.5* 25.0*  MCV 96.7 97.0 101.8* 100.8* 100.4*  PLT 126* 122* 109* 105* 97*   Basic Metabolic Panel: Recent Labs  Lab 09/17/19 0803 09/17/19 0803 09/18/19 0449 09/19/19 0537 09/20/19 0725 09/21/19 0524 09/22/19 1047  NA 139  --  139  --  142 145 142  K 3.8  --  3.6   --  3.4* 3.8 3.6  CL 105  --  103  --  104 108 102  CO2 29  --  29  --  30 31 29   GLUCOSE 165*  --  165*  --  132* 168* 167*  BUN 81*  --  85*  --  88* 80* 78*  CREATININE 2.78*   < > 3.06* 3.08* 3.07* 2.75* 3.12*  CALCIUM 8.3*  --  8.4*  --  8.4* 8.3* 8.2*  MG 2.1   < > 2.0 2.2 2.3 2.3 2.1  PHOS 4.4   < > 3.8 4.2 4.0 3.9 4.3   < > = values in this interval not displayed.   GFR: Estimated Creatinine Clearance: 28.5 mL/min (A) (by C-G formula based on SCr of 3.12 mg/dL (H)). Liver Function Tests: No results for input(s): AST, ALT, ALKPHOS, BILITOT, PROT, ALBUMIN in the last 168 hours. No results for  input(s): LIPASE, AMYLASE in the last 168 hours. No results for input(s): AMMONIA in the last 168 hours. Coagulation Profile: No results for input(s): INR, PROTIME in the last 168 hours. Cardiac Enzymes: No results for input(s): CKTOTAL, CKMB, CKMBINDEX, TROPONINI in the last 168 hours. BNP (last 3 results) No results for input(s): PROBNP in the last 8760 hours. HbA1C: No results for input(s): HGBA1C in the last 72 hours. CBG: Recent Labs  Lab 09/21/19 1556 09/21/19 2008 09/21/19 2318 09/22/19 0351 09/22/19 0750  GLUCAP 155* 148* 141* 138* 135*   Lipid Profile: No results for input(s): CHOL, HDL, LDLCALC, TRIG, CHOLHDL, LDLDIRECT in the last 72 hours. Thyroid Function Tests: No results for input(s): TSH, T4TOTAL, FREET4, T3FREE, THYROIDAB in the last 72 hours. Anemia Panel: No results for input(s): VITAMINB12, FOLATE, FERRITIN, TIBC, IRON, RETICCTPCT in the last 72 hours. Sepsis Labs: No results for input(s): PROCALCITON, LATICACIDVEN in the last 168 hours.  No results found for this or any previous visit (from the past 240 hour(s)).       Radiology Studies: No results found.      Scheduled Meds: . (feeding supplement) PROSource Plus  30 mL Oral TID WC  . sodium chloride   Intravenous Once  . Chlorhexidine Gluconate Cloth  6 each Topical Daily  . citalopram  10  mg Oral Daily  . epoetin (EPOGEN/PROCRIT) injection  20,000 Units Subcutaneous Weekly  . feeding supplement (NEPRO CARB STEADY)  237 mL Oral TID BM  . fludrocortisone  0.2 mg Oral BID  . insulin aspart  0-5 Units Subcutaneous QHS  . insulin aspart  0-9 Units Subcutaneous TID WC  . levothyroxine  175 mcg Oral Daily  . mouth rinse  15 mL Mouth Rinse BID  . midodrine  10 mg Oral TID WC  . multivitamin with minerals  1 tablet Oral Daily  . nystatin cream   Topical BID  . pantoprazole  40 mg Oral BID  . senna  1 tablet Oral BID  . zinc oxide  1 application Topical Daily   Continuous Infusions: . albumin human 12.5 g (09/22/19 0913)     LOS: 14 days    Time spent: 27 minutes    Sharen Hones, MD [facility]   To contact the attending provider between 7A-7P or the covering provider during after hours 7P-7A, please log into the web site [email or web address] and access using universal [place] password for that web site. If you do not have the password, please call the hospital operator.  09/22/2019, 11:44 AM

## 2019-09-23 DIAGNOSIS — K921 Melena: Secondary | ICD-10-CM

## 2019-09-23 LAB — GLUCOSE, CAPILLARY
Glucose-Capillary: 142 mg/dL — ABNORMAL HIGH (ref 70–99)
Glucose-Capillary: 272 mg/dL — ABNORMAL HIGH (ref 70–99)
Glucose-Capillary: 155 mg/dL — ABNORMAL HIGH (ref 70–99)
Glucose-Capillary: 149 mg/dL — ABNORMAL HIGH (ref 70–99)

## 2019-09-23 LAB — CBC WITH DIFFERENTIAL/PLATELET
Abs Immature Granulocytes: 0.04 10*3/uL (ref 0.00–0.07)
Basophils Absolute: 0 10*3/uL (ref 0.0–0.1)
Basophils Relative: 0 %
Eosinophils Absolute: 0.5 10*3/uL (ref 0.0–0.5)
Eosinophils Relative: 4 %
HCT: 25.2 % — ABNORMAL LOW (ref 39.0–52.0)
Hemoglobin: 8 g/dL — ABNORMAL LOW (ref 13.0–17.0)
Immature Granulocytes: 0 %
Lymphocytes Relative: 6 %
Lymphs Abs: 0.8 10*3/uL (ref 0.7–4.0)
MCH: 31.1 pg (ref 26.0–34.0)
MCHC: 31.7 g/dL (ref 30.0–36.0)
MCV: 98.1 fL (ref 80.0–100.0)
Monocytes Absolute: 0.7 10*3/uL (ref 0.1–1.0)
Monocytes Relative: 6 %
Neutro Abs: 10.6 10*3/uL — ABNORMAL HIGH (ref 1.7–7.7)
Neutrophils Relative %: 84 %
Platelets: 103 10*3/uL — ABNORMAL LOW (ref 150–400)
RBC: 2.57 MIL/uL — ABNORMAL LOW (ref 4.22–5.81)
RDW: 18.4 % — ABNORMAL HIGH (ref 11.5–15.5)
WBC: 12.6 10*3/uL — ABNORMAL HIGH (ref 4.0–10.5)
nRBC: 0 % (ref 0.0–0.2)

## 2019-09-23 LAB — BASIC METABOLIC PANEL
Anion gap: 9 (ref 5–15)
BUN: 85 mg/dL — ABNORMAL HIGH (ref 8–23)
CO2: 30 mmol/L (ref 22–32)
Calcium: 8.6 mg/dL — ABNORMAL LOW (ref 8.9–10.3)
Chloride: 103 mmol/L (ref 98–111)
Creatinine, Ser: 3.25 mg/dL — ABNORMAL HIGH (ref 0.61–1.24)
GFR calc Af Amer: 20 mL/min — ABNORMAL LOW (ref >60)
GFR calc non Af Amer: 17 mL/min — ABNORMAL LOW (ref >60)
Glucose, Bld: 154 mg/dL — ABNORMAL HIGH (ref 70–99)
Potassium: 3.3 mmol/L — ABNORMAL LOW (ref 3.5–5.1)
Sodium: 142 mmol/L (ref 135–145)

## 2019-09-23 LAB — PHOSPHORUS: Phosphorus: 4.1 mg/dL (ref 2.5–4.6)

## 2019-09-23 LAB — MAGNESIUM: Magnesium: 1.9 mg/dL (ref 1.7–2.4)

## 2019-09-23 MED ORDER — PANTOPRAZOLE SODIUM 40 MG IV SOLR
40.0000 mg | Freq: Two times a day (BID) | INTRAVENOUS | Status: DC
  Administered 2019-09-23 – 2019-09-28 (×11): 40 mg via INTRAVENOUS
  Filled 2019-09-23 (×11): qty 40

## 2019-09-23 MED ORDER — POTASSIUM CHLORIDE CRYS ER 20 MEQ PO TBCR
20.0000 meq | EXTENDED_RELEASE_TABLET | Freq: Once | ORAL | Status: AC
  Administered 2019-09-23: 20 meq via ORAL
  Filled 2019-09-23: qty 1

## 2019-09-23 NOTE — Anesthesia Postprocedure Evaluation (Signed)
Anesthesia Post Note  Patient: Andrew Valentine  Procedure(s) Performed: ESOPHAGOGASTRODUODENOSCOPY (EGD) WITH PROPOFOL (N/A )  Patient location during evaluation: Endoscopy Anesthesia Type: General Level of consciousness: awake and alert Pain management: pain level controlled Vital Signs Assessment: post-procedure vital signs reviewed and stable Respiratory status: spontaneous breathing, nonlabored ventilation, respiratory function stable and patient connected to nasal cannula oxygen Cardiovascular status: blood pressure returned to baseline and stable Postop Assessment: no apparent nausea or vomiting Anesthetic complications: no   No complications documented.   Last Vitals:  Vitals:   09/22/19 1937 09/23/19 0049  BP:  (!) 119/55  Pulse: 78 70  Resp: 20 17  Temp:  36.7 C  SpO2: 95% 98%    Last Pain:  Vitals:   09/23/19 0049  TempSrc: Oral  PainSc:                  Alphonsus Sias

## 2019-09-23 NOTE — Progress Notes (Signed)
Tele showing increase in PVC's. Dr. Roosevelt Locks notified via secure chat.

## 2019-09-23 NOTE — Progress Notes (Signed)
Turning every two hours. Skin care to skin folds in groin area. Cleansed sacral decub with sterile saline and covered with foam.

## 2019-09-23 NOTE — Progress Notes (Signed)
[facility]  ROUNDING NOTE   Subjective:   09/11 0701 - 09/12 0700 In: 138.3 [IV Piggyback:138.3] Out: 1400 [Urine:1400]  Patient son at bedside. Good urine output of 1.4 L over the preceding 24 hours. Creatinine slightly worse today at 3.25.   Objective:  Vital signs in last 24 hours:  Temp:  [97.9 F (36.6 C)-98.4 F (36.9 C)] 97.9 F (36.6 C) (09/12 0731) Pulse Rate:  [70-78] 72 (09/12 0731) Resp:  [17-20] 17 (09/12 0731) BP: (100-121)/(55-66) 121/66 (09/12 0731) SpO2:  [91 %-98 %] 91 % (09/12 0731)  Weight change:  Filed Weights   09/18/19 0631 09/19/19 0637 09/20/19 1252  Weight: (!) 161.6 kg (!) 159.9 kg (!) 159.9 kg    Intake/Output: I/O last 3 completed shifts: In: 138.3 [IV Piggyback:138.3] Out: 2650 [Urine:2650]   Intake/Output this shift:  Total I/O In: 41.1 [IV Piggyback:41.1] Out: 700 [Urine:700]  Physical Exam: General: In no acute distress  Head: Oral mucosal membranes moist  Lungs:  Lungs diminished at the bases  Heart: S1S2, ,irregular  Abdomen:  Non distended  Extremities: 1+ bilateral lower extremity edema  Neurologic: Alert,oriented  Skin: No new lesions or rashes  Access: LIJ temp HD catheter    Basic Metabolic Panel: Recent Labs  Lab 09/18/19 0449 09/18/19 0449 09/19/19 0537 09/20/19 0725 09/20/19 0725 09/21/19 0524 09/22/19 1047 09/23/19 0615  NA 139  --   --  142  --  145 142 142  K 3.6  --   --  3.4*  --  3.8 3.6 3.3*  CL 103  --   --  104  --  108 102 103  CO2 29  --   --  30  --  31 29 30   GLUCOSE 165*  --   --  132*  --  168* 167* 154*  BUN 85*  --   --  88*  --  80* 78* 85*  CREATININE 3.06*   < > 3.08* 3.07*  --  2.75* 3.12* 3.25*  CALCIUM 8.4*   < >  --  8.4*   < > 8.3* 8.2* 8.6*  MG 2.0   < > 2.2 2.3  --  2.3 2.1 1.9  PHOS 3.8   < > 4.2 4.0  --  3.9 4.3 4.1   < > = values in this interval not displayed.    Liver Function Tests: No results for input(s): AST, ALT, ALKPHOS, BILITOT, PROT, ALBUMIN  in the last 168 hours. No results for input(s): LIPASE, AMYLASE in the last 168 hours. No results for input(s): AMMONIA in the last 168 hours.  CBC: Recent Labs  Lab 09/18/19 0449 09/20/19 0725 09/21/19 0524 09/22/19 1047 09/23/19 0615  WBC 11.9* 9.2 12.8* 13.5* 12.6*  NEUTROABS  --  6.9 10.7* 11.5* 10.6*  HGB 7.5* 7.0* 8.2* 7.7* 8.0*  HCT 22.7* 23.1* 26.5* 25.0* 25.2*  MCV 97.0 101.8* 100.8* 100.4* 98.1  PLT 122* 109* 105* 97* 103*    Cardiac Enzymes: No results for input(s): CKTOTAL, CKMB, CKMBINDEX, TROPONINI in the last 168 hours.  BNP: Invalid input(s): POCBNP  CBG: Recent Labs  Lab 09/22/19 1208 09/22/19 1603 09/22/19 2008 09/23/19 0733 09/23/19 1131  GLUCAP 159* 161* 175* 142* 7*    Microbiology: Results for orders placed or performed during the hospital encounter of 09/08/19  MRSA PCR Screening     Status: None   Collection Time: 09/08/19 11:49 AM   Specimen: Nasopharyngeal  Result Value Ref Range Status   MRSA by  PCR NEGATIVE NEGATIVE Final    Comment:        The GeneXpert MRSA Assay (FDA approved for NASAL specimens only), is one component of a comprehensive MRSA colonization surveillance program. It is not intended to diagnose MRSA infection nor to guide or monitor treatment for MRSA infections. Performed at [facility], [address] [place], [place] [address]   SARS Coronavirus 2 by RT PCR (hospital order, performed in [facility] hospital lab) Nasopharyngeal Nasopharyngeal Swab     Status: None   Collection Time: 09/08/19 12:05 PM   Specimen: Nasopharyngeal Swab  Result Value Ref Range Status   SARS Coronavirus 2 NEGATIVE NEGATIVE Final    Comment: (NOTE) SARS-CoV-2 target nucleic acids are NOT DETECTED.  The SARS-CoV-2 RNA is generally detectable in upper and lower respiratory specimens during the acute phase of infection. The lowest concentration of SARS-CoV-2 viral copies this assay can detect is 250 copies / mL. A  negative result does not preclude SARS-CoV-2 infection and should not be used as the sole basis for treatment or other patient management decisions.  A negative result may occur with improper specimen collection / handling, submission of specimen other than nasopharyngeal swab, presence of viral mutation(s) within the areas targeted by this assay, and inadequate number of viral copies (<250 copies / mL). A negative result must be combined with clinical observations, patient history, and epidemiological information.  Fact Sheet for Patients:   StrictlyIdeas.no  Fact Sheet for Healthcare Providers: BankingDealers.co.za  This test is not yet approved or  cleared by the [place] FDA and has been authorized for detection and/or diagnosis of SARS-CoV-2 by FDA under an Emergency Use Authorization (EUA).  This EUA will remain in effect (meaning this test can be used) for the duration of the COVID-19 declaration under Section 564(b)(1) of the Act, 21 U.S.C. section 360bbb-3(b)(1), unless the authorization is terminated or revoked sooner.  Performed at [facility], [address] [place], [place] [address]   Blood culture (routine x 2)     Status: Abnormal   Collection Time: 09/08/19 12:07 PM   Specimen: BLOOD  Result Value Ref Range Status   Specimen Description   Final    BLOOD L DISTAL FOREARM Performed at [facility], [address] [place], [place] [address]    Special Requests   Final    BOTTLES DRAWN AEROBIC AND ANAEROBIC Blood Culture results may not be optimal due to an excessive volume of blood received in culture bottles Performed at [facility], [address], [place], [place] [address]    Culture  Setup Time   Final    GRAM POSITIVE COCCI AEROBIC BOTTLE ONLY CRITICAL RESULT CALLED TO, READ BACK BY AND VERIFIED WITH: MORGAN CUNNINGHAM  AT 4193 ON 09/09/19 SNG    Culture (A)  Final     STAPHYLOCOCCUS EPIDERMIDIS THE SIGNIFICANCE OF ISOLATING THIS ORGANISM FROM A SINGLE SET OF BLOOD CULTURES WHEN MULTIPLE SETS ARE DRAWN IS UNCERTAIN. PLEASE NOTIFY THE MICROBIOLOGY DEPARTMENT WITHIN ONE WEEK IF SPECIATION AND SENSITIVITIES ARE REQUIRED. Performed at [facility], [place] [address], [place], [place] [address]    Report Status 09/11/2019 FINAL  Final  Blood Culture ID Panel (Reflexed)     Status: Abnormal   Collection Time: 09/08/19 12:07 PM  Result Value Ref Range Status   Enterococcus faecalis NOT DETECTED NOT DETECTED Final   Enterococcus Faecium NOT DETECTED NOT DETECTED Final   Listeria monocytogenes NOT DETECTED NOT DETECTED Final  Staphylococcus species DETECTED (A) NOT DETECTED Final    Comment: CRITICAL RESULT CALLED TO, READ BACK BY AND VERIFIED WITH: MORGAN CUNNINGHAM AT 1345 ON 09/09/19 SNG    Staphylococcus aureus (BCID) NOT DETECTED NOT DETECTED Final   Staphylococcus epidermidis DETECTED (A) NOT DETECTED Final    Comment: CRITICAL RESULT CALLED TO, READ BACK BY AND VERIFIED WITH: MORGAN CUNNINGHAM AT 1345 ON 09/09/19 SNG    Staphylococcus lugdunensis NOT DETECTED NOT DETECTED Final   Streptococcus species NOT DETECTED NOT DETECTED Final   Streptococcus agalactiae NOT DETECTED NOT DETECTED Final   Streptococcus pneumoniae NOT DETECTED NOT DETECTED Final   Streptococcus pyogenes NOT DETECTED NOT DETECTED Final   A.calcoaceticus-baumannii NOT DETECTED NOT DETECTED Final   Bacteroides fragilis NOT DETECTED NOT DETECTED Final   Enterobacterales NOT DETECTED NOT DETECTED Final   Enterobacter cloacae complex NOT DETECTED NOT DETECTED Final   Escherichia coli NOT DETECTED NOT DETECTED Final   Klebsiella aerogenes NOT DETECTED NOT DETECTED Final   Klebsiella oxytoca NOT DETECTED NOT DETECTED Final   Klebsiella pneumoniae NOT DETECTED NOT DETECTED Final   Proteus species NOT DETECTED NOT DETECTED Final   Salmonella species NOT DETECTED NOT DETECTED Final    Serratia marcescens NOT DETECTED NOT DETECTED Final   Haemophilus influenzae NOT DETECTED NOT DETECTED Final   Neisseria meningitidis NOT DETECTED NOT DETECTED Final   Pseudomonas aeruginosa NOT DETECTED NOT DETECTED Final   Stenotrophomonas maltophilia NOT DETECTED NOT DETECTED Final   Candida albicans NOT DETECTED NOT DETECTED Final   Candida auris NOT DETECTED NOT DETECTED Final   Candida glabrata NOT DETECTED NOT DETECTED Final   Candida krusei NOT DETECTED NOT DETECTED Final   Candida parapsilosis NOT DETECTED NOT DETECTED Final   Candida tropicalis NOT DETECTED NOT DETECTED Final   Cryptococcus neoformans/gattii NOT DETECTED NOT DETECTED Final   Methicillin resistance mecA/C NOT DETECTED NOT DETECTED Final    Comment: Performed at [facility], [address] [place], [place] [address]  Blood culture (routine x 2)     Status: None   Collection Time: 09/08/19 12:59 PM   Specimen: BLOOD  Result Value Ref Range Status   Specimen Description BLOOD LEFT UPPER FORARM  Final   Special Requests   Final    BOTTLES DRAWN AEROBIC AND ANAEROBIC Blood Culture results may not be optimal due to an excessive volume of blood received in culture bottles   Culture   Final    NO GROWTH 5 DAYS Performed at [facility], [address] [place], [place] [address]    Report Status 09/13/2019 FINAL  Final    Coagulation Studies: No results for input(s): LABPROT, INR in the last 72 hours.  Urinalysis: No results for input(s): COLORURINE, LABSPEC, PHURINE, GLUCOSEU, HGBUR, BILIRUBINUR, KETONESUR, PROTEINUR, UROBILINOGEN, NITRITE, LEUKOCYTESUR in the last 72 hours.  Invalid input(s): APPERANCEUR    Imaging: No results found.   Medications:   . albumin human 12.5 g (09/23/19 0838)   . (feeding supplement) PROSource Plus  30 mL Oral TID WC  . sodium chloride   Intravenous Once  . Chlorhexidine Gluconate Cloth  6 each Topical Daily  . citalopram  10 mg Oral Daily  .  epoetin (EPOGEN/PROCRIT) injection  20,000 Units Subcutaneous Weekly  . feeding supplement (NEPRO CARB STEADY)  237 mL Oral TID BM  . fludrocortisone  0.2 mg Oral BID  . insulin aspart  0-5 Units Subcutaneous QHS  . insulin aspart  0-9 Units Subcutaneous TID WC  . levothyroxine  175 mcg Oral Daily  . mouth rinse  15 mL Mouth Rinse BID  . midodrine  10 mg Oral TID WC  . multivitamin with minerals  1 tablet Oral Daily  . nystatin cream   Topical BID  . pantoprazole (PROTONIX) IV  40 mg Intravenous Q12H  . senna  1 tablet Oral BID  . zinc oxide  1 application Topical Daily   acetaminophen **OR** acetaminophen, heparin sodium (porcine), ipratropium-albuterol, ondansetron **OR** ondansetron (ZOFRAN) IV, ondansetron (ZOFRAN) IV, polyethylene glycol  Assessment/ Plan:  Mr. Andrew Valentine is a 79 y.o. 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, hypoventilation syndrome, hyperlipidemia who is admitted to [facility] on 09/08/2019 for Acidosis [E87.2] Hyperkalemia [E87.5] ATN (acute tubular necrosis) (HCC) [N17.0] Acute renal failure with tubular necrosis (HCC) [N17.0] Acute renal failure (HCC) [N17.9] Hypoxia [R09.02] NSTEMI (non-ST elevated myocardial infarction) ([place]) [I21.4] AKI (acute kidney injury) ([place]) [N17.9] Acute hypoxemic respiratory failure (HCC) [J96.01] Hypotension due to hypovolemia [I95.89, E86.1]  # Acute renal failure And anasarca Chronic kidney disease stage IIIB.  Baseline Creatinine 1.5  Nonoliguric urine output. 09/11 0701 - 09/12 0700 In: 138.3 [IV Piggyback:138.3] Out: 1400 [Urine:1400]   - Required renal replacement therapy. CVVHDF from 8/29 to 9/2 - IHD on 9/4 -Urine output 1.4 L over the preceding 24 hours.  Creatinine up slightly to 3.2.  No immediate need for dialysis.  Continue to plan for temporary dialysis catheter removal tomorrow if renal function is steady..  # Hypotension Blood  pressure reading in low normal range Required vasopressors during hospital stay- norepinephrine, cardiogenic and septic shock from pneumonia -Continue midodrine and fludrocortisone.   #Bacterial pneumonia Improved Patient has underlying pulmonary hypertension, obstructive sleep apnea and morbid obesity.  Previously treated.   #Anemia of chronic disease Lab Results  Component Value Date   HGB 8.0 (L) 09/23/2019    Patient has black tarry stools and stool positive for occult blood.  Endoscopy - non bleeding gastric ulcers Maintain the patient on Epogen.     LOS: 15 Marieke Lubke 9/12/20212:40 PM

## 2019-09-23 NOTE — TOC Progression Note (Signed)
Transition of Care [facility]) - Progression Note    Patient Details  Name: Andrew Valentine MRN: [id number] Date of Birth: [birth year]  Transition of [facility] CM/SW Contact  Boris Sharper, LCSW Phone Number: 09/23/2019, 1:30 PM  Clinical Narrative:    CSW contacted pt's son Andrew Valentine to follow up on SNF decision. Pt's son chose [facility] Andrew Valentine notified CSW that they have called a care team meeting to further discuss plans upon discharge. Family is requesting a hospice consult to see if he is hospice home appropriate. Family discussed POA being established tomorrow at the meeting.         Expected Discharge Plan and Services                                                 Social Determinants of Health (SDOH) Interventions    Readmission Risk Interventions No flowsheet data found.

## 2019-09-23 NOTE — Progress Notes (Signed)
PROGRESS NOTE    Andrew Valentine  WIO:[id number] DOB: [birth year] DOA: 09/08/2019 PCP: [facility]  Chief complaint.  Shortness of breath.  Brief Narrative:  39M hx of Morbid obesity BMI >50, CHF, DM, hypothoroidism, came from home due to AKI. He lives alone and cannot take care of himself. He was noted to be lethargic in ED with hypercapnia and hypoxemia. Daughter she states she came to check on him and blood glucose was 47, he was unable to get out of chair. He was weak could not walk, desaturating 70s. He was encephalopathic at this time. Daughter lives next door.Was admitted to ICU initially. Patient had a significant weight gain over the last 2 weeks. He has anasarca. He was placed on CRRT. 09/10/19- patient is awake this am, he on levphed 25, hes on CRRT with dyasylate modifcation this am by renal team. S/p wound care eval, UOP has improved.  8/31- patient weaned off vasopressin, weaned levophed from 25>>10.  9/1- patient was evaluated by PALS, he does not wish to have permanent dialysis. Code status discussion in progress.  9/2- Plan to remove Left IJ HD catheter, trial of Lasix as per Nephrology 9/3- patient reports feeling well, he ate dinner states he feels well. S/p vascular eval for perm cath placement. 1 unit prbc transfustion ordered, heparin stopped due to borderline low h/h 9/4 medical therapy  9/8.Patient had a significant black stool, positive occult blood. Change Protonix to 40 mg IV twice a day, GI consult. He has been making large amount of urine, has not been dialyzed since 9/4.  9/9.EGD showed multiple gastric ulcers. No active bleeding. Hemoglobin dropped down to 7.0, transfuse 1 unit PRBC.  9/10.Hemoglobin stable, creatinine better at2.75.  9/12.  Patient continued to have black stools, hemoglobin dropped down to 7.7 yesterday.  Restart Protonix IV twice a day.  Ask GI to see patient again.   Assessment & Plan:   Active  Problems:   Acute hypoxemic respiratory failure (HCC)   Acute renal failure (HCC)   Hyperkalemia   NSTEMI (non-ST elevated myocardial infarction) (HCC)   Pressure injury of skin   Pulmonary hypertension, unspecified (HCC)   Persistent atrial fibrillation (HCC)   Anemia   Acute right-sided CHF (congestive heart failure) ([place])   Acute blood loss anemia  #1.  Acute hypoxemic respiratory failure. Multifactorial.  Secondary to severe pulmonary hypertension, obesity hypoventilation syndrome, obstructive sleep apnea, right-sided congestive heart failure as well as aspiration pneumonia. Condition has been improving.  Oxygenation is stabilized.  2.  Acute blood loss anemia secondary to upper GI bleed secondary to gastric ulcers. This appears to be secondary to NSAIDs, patient apparently with taking multiple doses of NSAIDs at home. EGD showed multiple gastric ulcers. Patient continued to have black stools, hemoglobin has not been very stable. I will change back Protonix to IV.  Ask GI to see patient again.  3.  Acute kidney injury on chronic kidney disease stage IIIb. Worsening renal function today appears to be secondary to GI bleed.  Patient also receiving daily albumin, appreciate nephrology input. Patient still have a temporary hemodialysis catheter and a Foley catheter in place.  4.  Acute right-sided congestive heart failure with anasarca and severe pulmonary hypertension. Condition appears to be stable.  5.  Hypotension. Resolved.  6.  Obstructive sleep apnea. Continue CPAP while asleep.  7.  Sacrum decubitus ulcer. Wound care consult.    DVT prophylaxis: SCDs Code Status: DNR Family Communication: Updated patient  son.  Marland Kitchen  Status is: Inpatient  Remains inpatient appropriate because:Inpatient level of care appropriate due to severity of illness   Dispo: The patient is from: Home              Anticipated d/c is to: SNF              Anticipated d/c date is: > 3  days              Patient currently is not medically stable to d/c.        I/O last 3 completed shifts: In: 138.3 [IV Piggyback:138.3] Out: 2650 [Urine:2650] Total I/O In: 41.1 [IV Piggyback:41.1] Out: 700 [Urine:700]     Consultants:   GI, nephrology.  Procedures: None  Antimicrobials: None  Subjective: Patient still has significant black stools.  Also identified sacrum decubitus ulcer.  He does not have any dizziness. Short of breath has improved.  No cough. No fever or chills.  Objective: Vitals:   09/22/19 1603 09/22/19 1937 09/23/19 0049 09/23/19 0731  BP: 100/63  (!) 119/55 121/66  Pulse: 76 78 70 72  Resp: 18 20 17 17   Temp: 98.4 F (36.9 C)  98 F (36.7 C) 97.9 F (36.6 C)  TempSrc: Oral  Oral Oral  SpO2: 97% 95% 98% 91%  Weight:      Height:        Intake/Output Summary (Last 24 hours) at 09/23/2019 1118 Last data filed at 09/23/2019 4680 Gross per 24 hour  Intake 88.17 ml  Output 2100 ml  Net -2011.83 ml   Filed Weights   09/18/19 0631 09/19/19 0637 09/20/19 1252  Weight: (!) 161.6 kg (!) 159.9 kg (!) 159.9 kg    Examination:  General exam: Appears calm and comfortable, morbid obese. Respiratory system: Clear to auscultation. Respiratory effort normal. Cardiovascular system: S1 & S2 heard, RRR. No JVD, murmurs, rubs, gallops or clicks. 1+ pedal edema. Gastrointestinal system: Abdomen is nondistended, soft and nontender. No organomegaly or masses felt. Normal bowel sounds heard. Central nervous system: Alert and oriented. No focal neurological deficits. Extremities: Symmetric  Skin: Sacral decubitus ulcer identified. Psychiatry: Mood & affect appropriate.     Data Reviewed: I have personally reviewed following labs and imaging studies  CBC: Recent Labs  Lab 09/18/19 0449 09/20/19 0725 09/21/19 0524 09/22/19 1047 09/23/19 0615  WBC 11.9* 9.2 12.8* 13.5* 12.6*  NEUTROABS  --  6.9 10.7* 11.5* 10.6*  HGB 7.5* 7.0* 8.2* 7.7* 8.0*   HCT 22.7* 23.1* 26.5* 25.0* 25.2*  MCV 97.0 101.8* 100.8* 100.4* 98.1  PLT 122* 109* 105* 97* 321*   Basic Metabolic Panel: Recent Labs  Lab 09/18/19 0449 09/18/19 0449 09/19/19 0537 09/20/19 0725 09/21/19 0524 09/22/19 1047 09/23/19 0615  NA 139  --   --  142 145 142 142  K 3.6  --   --  3.4* 3.8 3.6 3.3*  CL 103  --   --  104 108 102 103  CO2 29  --   --  30 31 29 30   GLUCOSE 165*  --   --  132* 168* 167* 154*  BUN 85*  --   --  88* 80* 78* 85*  CREATININE 3.06*   < > 3.08* 3.07* 2.75* 3.12* 3.25*  CALCIUM 8.4*  --   --  8.4* 8.3* 8.2* 8.6*  MG 2.0   < > 2.2 2.3 2.3 2.1 1.9  PHOS 3.8   < > 4.2 4.0 3.9 4.3 4.1   < > = values in this  interval not displayed.   GFR: Estimated Creatinine Clearance: 27.4 mL/min (A) (by C-G formula based on SCr of 3.25 mg/dL (H)). Liver Function Tests: No results for input(s): AST, ALT, ALKPHOS, BILITOT, PROT, ALBUMIN in the last 168 hours. No results for input(s): LIPASE, AMYLASE in the last 168 hours. No results for input(s): AMMONIA in the last 168 hours. Coagulation Profile: No results for input(s): INR, PROTIME in the last 168 hours. Cardiac Enzymes: No results for input(s): CKTOTAL, CKMB, CKMBINDEX, TROPONINI in the last 168 hours. BNP (last 3 results) No results for input(s): PROBNP in the last 8760 hours. HbA1C: No results for input(s): HGBA1C in the last 72 hours. CBG: Recent Labs  Lab 09/22/19 0750 09/22/19 1208 09/22/19 1603 09/22/19 2008 09/23/19 0733  GLUCAP 135* 159* 161* 175* 142*   Lipid Profile: No results for input(s): CHOL, HDL, LDLCALC, TRIG, CHOLHDL, LDLDIRECT in the last 72 hours. Thyroid Function Tests: No results for input(s): TSH, T4TOTAL, FREET4, T3FREE, THYROIDAB in the last 72 hours. Anemia Panel: No results for input(s): VITAMINB12, FOLATE, FERRITIN, TIBC, IRON, RETICCTPCT in the last 72 hours. Sepsis Labs: No results for input(s): PROCALCITON, LATICACIDVEN in the last 168 hours.  No results found  for this or any previous visit (from the past 240 hour(s)).       Radiology Studies: No results found.      Scheduled Meds: . (feeding supplement) PROSource Plus  30 mL Oral TID WC  . sodium chloride   Intravenous Once  . Chlorhexidine Gluconate Cloth  6 each Topical Daily  . citalopram  10 mg Oral Daily  . epoetin (EPOGEN/PROCRIT) injection  20,000 Units Subcutaneous Weekly  . feeding supplement (NEPRO CARB STEADY)  237 mL Oral TID BM  . fludrocortisone  0.2 mg Oral BID  . insulin aspart  0-5 Units Subcutaneous QHS  . insulin aspart  0-9 Units Subcutaneous TID WC  . levothyroxine  175 mcg Oral Daily  . mouth rinse  15 mL Mouth Rinse BID  . midodrine  10 mg Oral TID WC  . multivitamin with minerals  1 tablet Oral Daily  . nystatin cream   Topical BID  . pantoprazole (PROTONIX) IV  40 mg Intravenous Q12H  . senna  1 tablet Oral BID  . zinc oxide  1 application Topical Daily   Continuous Infusions: . albumin human 12.5 g (09/23/19 0838)     LOS: 15 days    Time spent: 36 minutes    Sharen Hones, MD [facility]   To contact the attending provider between 7A-7P or the covering provider during after hours 7P-7A, please log into the web site [email or web address] and access using universal [place] password for that web site. If you do not have the password, please call the hospital operator.  09/23/2019, 11:18 AM

## 2019-09-23 NOTE — Progress Notes (Signed)
Vonda Antigua, MD [address] [place], [place], [place], [address] [address], [place], [address], [place], [address] Phone: [phone]  Fax: [phone]   Subjective: Patient resting in bed with son at bedside.  Patient had 1 melanotic bowel movement and GI was recalled.  No abdominal pain, no emesis.  Patient hemodynamically stable.   Objective: Exam: Vital signs in last 24 hours: Vitals:   09/22/19 1937 09/23/19 0049 09/23/19 0731 09/23/19 1516  BP:  (!) 119/55 121/66 114/61  Pulse: 78 70 72 79  Resp: 20 17 17 15   Temp:  98 F (36.7 C) 97.9 F (36.6 C) 97.9 F (36.6 C)  TempSrc:  Oral Oral Oral  SpO2: 95% 98% 91% 92%  Weight:      Height:       Weight change:   Intake/Output Summary (Last 24 hours) at 09/23/2019 1611 Last data filed at 09/23/2019 4132 Gross per 24 hour  Intake 41.11 ml  Output 2100 ml  Net -2058.89 ml    General: No acute distress, AAO x3 Abd: Soft, NT/ND, No HSM Skin: Warm, no rashes Neck: Supple, Trachea midline   Lab Results: Lab Results  Component Value Date   WBC 12.6 (H) 09/23/2019   HGB 8.0 (L) 09/23/2019   HCT 25.2 (L) 09/23/2019   MCV 98.1 09/23/2019   PLT 103 (L) 09/23/2019   Micro Results: No results found for this or any previous visit (from the past 240 hour(s)). Studies/Results: No results found. Medications:  Scheduled Meds: . (feeding supplement) PROSource Plus  30 mL Oral TID WC  . sodium chloride   Intravenous Once  . Chlorhexidine Gluconate Cloth  6 each Topical Daily  . citalopram  10 mg Oral Daily  . epoetin (EPOGEN/PROCRIT) injection  20,000 Units Subcutaneous Weekly  . feeding supplement (NEPRO CARB STEADY)  237 mL Oral TID BM  . fludrocortisone  0.2 mg Oral BID  . insulin aspart  0-5 Units Subcutaneous QHS  . insulin aspart  0-9 Units Subcutaneous TID WC  . levothyroxine  175 mcg Oral Daily  . mouth rinse  15 mL Mouth Rinse BID  . midodrine  10 mg Oral TID WC  . multivitamin with minerals   1 tablet Oral Daily  . nystatin cream   Topical BID  . pantoprazole (PROTONIX) IV  40 mg Intravenous Q12H  . potassium chloride  20 mEq Oral Once  . senna  1 tablet Oral BID  . zinc oxide  1 application Topical Daily   Continuous Infusions: . albumin human 12.5 g (09/23/19 0838)   PRN Meds:.acetaminophen **OR** acetaminophen, heparin sodium (porcine), ipratropium-albuterol, ondansetron **OR** ondansetron (ZOFRAN) IV, ondansetron (ZOFRAN) IV, polyethylene glycol   Assessment: Active Problems:   Acute hypoxemic respiratory failure (HCC)   Acute renal failure (HCC)   Hyperkalemia   NSTEMI (non-ST elevated myocardial infarction) (HCC)   Pressure injury of skin   Pulmonary hypertension, unspecified (HCC)   Persistent atrial fibrillation (HCC)   Anemia   Acute right-sided CHF (congestive heart failure) (HCC)   Acute blood loss anemia    Plan: Patient underwent upper endoscopy with Dr. Terri Piedra with clean-based ulcer seen on 09/20/2019  Although patient had a melanotic bowel movement, his hemoglobin has stayed relatively stable in the last 3 days, and he is hemodynamically stable  Suspect possible residual stool passing.  It appears his PPI was p.o. dosing yesterday and has been changed to IV dosing today.  Would continue IV dosing at this time  If patient  continues to have melanotic bowel movements or drop in hemoglobin, repeat upper endoscopy may need to be considered, but given hemodynamic stability, stable hemoglobin, and clean-based ulcers reported on upper endoscopy which are by definition at low risk of rebleeding, would continue to monitor at this time   PPI IV twice daily  Continue serial CBCs and transfuse PRN Avoid NSAIDs Maintain 2 large-bore IV lines Please page GI with any acute hemodynamic changes, or signs of active GI bleeding  Dr. Vicente Males to cover from tomorrow  LOS: 15 days   Vonda Antigua, MD 09/23/2019, 4:11 PM

## 2019-09-23 NOTE — Progress Notes (Signed)
Son requested pt to have specialty mattress. Dr Roosevelt Locks he was ok with it. Order placed for mattress by this Probation officer

## 2019-09-24 LAB — CBC WITH DIFFERENTIAL/PLATELET
Abs Immature Granulocytes: 0.07 10*3/uL (ref 0.00–0.07)
Basophils Absolute: 0 10*3/uL (ref 0.0–0.1)
Basophils Relative: 0 %
Eosinophils Absolute: 0.4 10*3/uL (ref 0.0–0.5)
Eosinophils Relative: 3 %
HCT: 24.2 % — ABNORMAL LOW (ref 39.0–52.0)
Hemoglobin: 7.7 g/dL — ABNORMAL LOW (ref 13.0–17.0)
Immature Granulocytes: 1 %
Lymphocytes Relative: 5 %
Lymphs Abs: 0.7 10*3/uL (ref 0.7–4.0)
MCH: 31.6 pg (ref 26.0–34.0)
MCHC: 31.8 g/dL (ref 30.0–36.0)
MCV: 99.2 fL (ref 80.0–100.0)
Monocytes Absolute: 0.7 10*3/uL (ref 0.1–1.0)
Monocytes Relative: 6 %
Neutro Abs: 11.5 10*3/uL — ABNORMAL HIGH (ref 1.7–7.7)
Neutrophils Relative %: 85 %
Platelets: 115 10*3/uL — ABNORMAL LOW (ref 150–400)
RBC: 2.44 MIL/uL — ABNORMAL LOW (ref 4.22–5.81)
RDW: 18.5 % — ABNORMAL HIGH (ref 11.5–15.5)
WBC: 13.4 10*3/uL — ABNORMAL HIGH (ref 4.0–10.5)
nRBC: 0 % (ref 0.0–0.2)

## 2019-09-24 LAB — BASIC METABOLIC PANEL
Anion gap: 9 (ref 5–15)
BUN: 82 mg/dL — ABNORMAL HIGH (ref 8–23)
CO2: 31 mmol/L (ref 22–32)
Calcium: 8.3 mg/dL — ABNORMAL LOW (ref 8.9–10.3)
Chloride: 104 mmol/L (ref 98–111)
Creatinine, Ser: 3.15 mg/dL — ABNORMAL HIGH (ref 0.61–1.24)
GFR calc Af Amer: 21 mL/min — ABNORMAL LOW (ref >60)
GFR calc non Af Amer: 18 mL/min — ABNORMAL LOW (ref >60)
Glucose, Bld: 140 mg/dL — ABNORMAL HIGH (ref 70–99)
Potassium: 3.3 mmol/L — ABNORMAL LOW (ref 3.5–5.1)
Sodium: 144 mmol/L (ref 135–145)

## 2019-09-24 LAB — GLUCOSE, CAPILLARY
Glucose-Capillary: 129 mg/dL — ABNORMAL HIGH (ref 70–99)
Glucose-Capillary: 143 mg/dL — ABNORMAL HIGH (ref 70–99)
Glucose-Capillary: 202 mg/dL — ABNORMAL HIGH (ref 70–99)
Glucose-Capillary: 163 mg/dL — ABNORMAL HIGH (ref 70–99)

## 2019-09-24 LAB — SURGICAL PATHOLOGY

## 2019-09-24 LAB — MAGNESIUM: Magnesium: 2 mg/dL (ref 1.7–2.4)

## 2019-09-24 MED ORDER — COLLAGENASE 250 UNIT/GM EX OINT
TOPICAL_OINTMENT | Freq: Every day | CUTANEOUS | Status: DC
  Filled 2019-09-24: qty 30

## 2019-09-24 MED ORDER — POTASSIUM CHLORIDE 20 MEQ PO PACK
40.0000 meq | PACK | Freq: Once | ORAL | Status: AC
  Administered 2019-09-24: 40 meq via ORAL
  Filled 2019-09-24: qty 2

## 2019-09-24 MED ORDER — SODIUM CHLORIDE 0.9 % IV SOLN
300.0000 mg | Freq: Once | INTRAVENOUS | Status: AC
  Administered 2019-09-24: 300 mg via INTRAVENOUS
  Filled 2019-09-24: qty 15

## 2019-09-24 NOTE — Progress Notes (Signed)
Occupational Therapy Treatment Patient Details Name: Andrew Valentine MRN: [id number] DOB: 1940/06/22 Today's Date: 09/24/2019    History of present illness Pt is a 79 year old male with a known history of morbid obesity, sleep apnea intolerant noncompliant to CPAP, hypertension, diabetes, hyperlipidemia, hypothyroidism, remote history of tobacco abuse comes to the emergency room with altered mental status and hypoxia.  Pt was on CRRT starting 09/09/19 now off CRRT with plan for PermCath placement 09/14/19.  MD assessment includes: Acute on chronic kidney failure, acute hypoxic respiratory failure, pulmonary HTN, anemia, and hypotension.   OT comments  Pt seen for OT treatment (Co-Tx with PTA) this date to f/u re: safety with ADLs/ADL mobility. Pt tolerates sup to sit transition with MAX to TOTAL A +2 with MOD verbal cues for hand/foot placement. Pt then tolerates sitting EOB ~4-80mns with primarily MOD A to sustain static sitting. Pt requires UE support while in static sitting and therefore is unable to complete fxl ADL task in sitting at this time. Pt requests to perform sit to sup 2/2 fatigue and requires complete TOTAL A +2 to return to bed. Pt with episode of bowel incontinence requiring MAX A +2 for rolling and TOTAL A for bed level peri care. Pt requires MOD A for hand ot mouth with cup with lid to take sips of water, OT encourages increased functional reach with this task to maintain baseline level of UE ROM. Lastly, OT conducts review of bed level exercises and stretches previously covered. Pt with good reception, but little energy to actively participate in review outside of verbal education. Overall, SNF continues to be most appropriate d/c recommendation.    Follow Up Recommendations  SNF    Equipment Recommendations  Other (comment) (defer to next level of care)    Recommendations for Other Services      Precautions / Restrictions Precautions Precautions: Fall Restrictions Weight Bearing  Restrictions: No       Mobility Bed Mobility Overal bed mobility: Needs Assistance Bed Mobility: Rolling;Supine to Sit;Sit to Supine Rolling: Max assist   Supine to sit: +2 for physical assistance;+2 for safety/equipment;Max assist;Total assist Sit to supine: Total assist;+2 for physical assistance;+2 for safety/equipment   General bed mobility comments: pt requires use of bed rails, extended time, cues for hand/foot placement. Pt with some level of effot to come to sitting-is able to meaningfully work LEs towards EOB. Hoever, is completed total A +2 for sit to sup 2/2 fatigue.  Transfers                 General transfer comment: unsafe to trial transfers    Balance Overall balance assessment: Needs assistance Sitting-balance support: Single extremity supported;Feet supported Sitting balance-Leahy Scale: Poor Sitting balance - Comments: Primarily MOD A to sustain static EOB sitting with use of UEs to stabilize. Once B UEs engaged to contribute to sitting balance, pt has two short bouts (less than 10 seconds) to sustain w/o external support       Standing balance comment: NT                           ADL either performed or assessed with clinical judgement   ADL Overall ADL's : Needs assistance/impaired Eating/Feeding: Moderate assistance;Bed level Eating/Feeding Details (indicate cue type and reason): HOB elevated, supported sitting, use of both hands for hand to mouth with cup with lid to take sips of water.  Toileting- Clothing Manipulation and Hygiene: Total assistance;+2 for physical assistance;Bed level Toileting - Clothing Manipulation Details (indicate cue type and reason): lateral rolling tehcnique, pt encouraged to wait for bed pan, but has episode of bowel incontinence with sit to sup to get back to bed after therapeutic activity.             Vision Patient Visual Report: No change from baseline      Perception     Praxis      Cognition Arousal/Alertness: Awake/alert Behavior During Therapy: WFL for tasks assessed/performed Overall Cognitive Status: Within Functional Limits for tasks assessed                                 General Comments: pt is A&O, appropriate with commands/cues.        Exercises Other Exercises Other Exercises: OT facilitates review with pt re: UE bed level exercise and neck stretches that p tis to complete to prevent atrophy and maintain elasticity of connective tissue. Pt with good understanding, but little energy/enthusiasm to participate in review of exercises. Other Exercises: OT facilitates pt particpation in reaching/self-drinking with both hands to obtain cup from table and perform hand to mouth. Pt requires MOD A To obtain from table d/t limtied shoulder ROM, able to perform hand to mouth with SBA once OT has given cup to pt.   Shoulder Instructions       General Comments      Pertinent Vitals/ Pain       Pain Assessment: Faces Faces Pain Scale: Hurts a little bit Pain Location: LLE  Pain Descriptors / Indicators: Dull;Grimacing Pain Intervention(s): Limited activity within patient's tolerance;Monitored during session;Repositioned  Home Living                                          Prior Functioning/Environment              Frequency  Min 1X/week        Progress Toward Goals  OT Goals(current goals can now be found in the care plan section)  Progress towards OT goals: Progressing toward goals (one goal met and upgraded-mobilization goal, now changed to fxl activity tolerance in sitting goal)  Acute Rehab OT Goals Patient Stated Goal: To return to PLOF OT Goal Formulation: With patient Time For Goal Achievement: 10/08/19 Potential to Achieve Goals: Fair ADL Goals Additional ADL Goal #1: Pt will tolerate EOB sitting x7-8 mins with MOD A to while participating in 1 g/h task/therapeutic  activity to increase fxl activity tolerance.  Plan Discharge plan remains appropriate;Frequency remains appropriate    Co-evaluation    PT/OT/SLP Co-Evaluation/Treatment: Yes Reason for Co-Treatment: Complexity of the patient's impairments (multi-system involvement);For patient/therapist safety PT goals addressed during session: Mobility/safety with mobility OT goals addressed during session: ADL's and self-care;Strengthening/ROM      AM-PAC OT "6 Clicks" Daily Activity     Outcome Measure   Help from another person eating meals?: A Little Help from another person taking care of personal grooming?: A Lot Help from another person toileting, which includes using toliet, bedpan, or urinal?: Total Help from another person bathing (including washing, rinsing, drying)?: A Lot Help from another person to put on and taking off regular upper body clothing?: A Lot Help from another person to put on and taking off regular lower  body clothing?: A Lot 6 Click Score: 12    End of Session Equipment Utilized During Treatment: Oxygen  OT Visit Diagnosis: Unsteadiness on feet (R26.81);Other abnormalities of gait and mobility (R26.89);Repeated falls (R29.6);Muscle weakness (generalized) (M62.81)   Activity Tolerance Patient tolerated treatment well   Patient Left in bed;with call bell/phone within reach;with bed alarm set   Nurse Communication          Time: 1007-1030 OT Time Calculation (min): 23 min  Charges: OT General Charges $OT Visit: 1 Visit OT Treatments $Self Care/Home Management : 8-22 mins  Gerrianne Scale, [place], OTR/L ascom [phone] 09/24/19, 2:19 PM

## 2019-09-24 NOTE — Progress Notes (Addendum)
Physical Therapy Treatment Patient Details Name: Andrew Valentine MRN: [id number] DOB: [birth year] Today's Date: 09/24/2019    History of Present Illness Pt is a 79 year old male with a known history of morbid obesity, sleep apnea intolerant noncompliant to CPAP, hypertension, diabetes, hyperlipidemia, hypothyroidism, remote history of tobacco abuse comes to the emergency room with altered mental status and hypoxia.  Pt was on CRRT starting 09/09/19 now off CRRT with plan for PermCath placement 09/14/19.  MD assessment includes: Acute on chronic kidney failure, acute hypoxic respiratory failure, pulmonary HTN, anemia, and hypotension.    PT Comments    Co-tx with OT 10:07-10:30  Family meeting 11:00-11:30.  3 units billed PT, 1 OT per protocol  Pt ready for session.  Nursing in to change out bed on arrival for session.  Assisted with supine sliding transfer to new bed.  After positioning, pt able to transition to sitting EOB with max a/dependant x 2.  He sits EOB 4-5 minutes with constant assist to remain upright with poor hip flexion and post lean.  Unable to progress to attempts at standing.  Pt fatigued and self initiated return to supine with max a/dependant x 2 to position.  Rolling left/right with max a/dependant to provide care for small BM smear.    New air mattress bed is deflated to allow for mobility skills.  Bed is too high to allow for his feet to touch the floor.  May benefit from a small stood for further sitting attempts.  Pt is appropriate for Hoyer/Viking lift transfers for OOB.    Attended family meeting at 11:00 with pt, son,daughter, MD and SWS. Discussed care in regards to therapies, POC, frequency and current status.  Questions answered.     Follow Up Recommendations  SNF     Equipment Recommendations  Other (comment);3in1 (PT) (defer to next level of care)    Recommendations for Other Services       Precautions / Restrictions Precautions Precautions: Fall    Mobility   Bed Mobility Overal bed mobility: Needs Assistance Bed Mobility: Rolling;Supine to Sit;Sit to Supine Rolling: Max assist   Supine to sit: Total assist;+2 for physical assistance;+2 for safety/equipment Sit to supine: Total assist;+2 for physical assistance;+2 for safety/equipment      Transfers                 General transfer comment: unsafe to trial transfers  Ambulation/Gait                 Stairs             Wheelchair Mobility    Modified Rankin (Stroke Patients Only)       Balance Overall balance assessment: Needs assistance Sitting-balance support: Single extremity supported;Feet supported Sitting balance-Leahy Scale: Poor Sitting balance - Comments: MOD A to maintain sitting balance at EOB c single UE support                                    Cognition Arousal/Alertness: Awake/alert Behavior During Therapy: WFL for tasks assessed/performed Overall Cognitive Status: Within Functional Limits for tasks assessed                                        Exercises      General Comments        Pertinent  Vitals/Pain Pain Assessment: Faces Faces Pain Scale: Hurts a little bit Pain Location: LLE  Pain Descriptors / Indicators: Dull;Grimacing Pain Intervention(s): Limited activity within patient's tolerance;Monitored during session;Repositioned    Home Living                      Prior Function            PT Goals (current goals can now be found in the care plan section) Progress towards PT goals: Progressing toward goals    Frequency    Min 2X/week      PT Plan Current plan remains appropriate    Co-evaluation PT/OT/SLP Co-Evaluation/Treatment: Yes Reason for Co-Treatment: Complexity of the patient's impairments (multi-system involvement);For patient/therapist safety PT goals addressed during session: Mobility/safety with mobility OT goals addressed during session:  Strengthening/ROM      AM-PAC PT "6 Clicks" Mobility   Outcome Measure  Help needed turning from your back to your side while in a flat bed without using bedrails?: A Lot Help needed moving from lying on your back to sitting on the side of a flat bed without using bedrails?: Total Help needed moving to and from a bed to a chair (including a wheelchair)?: Total Help needed standing up from a chair using your arms (e.g., wheelchair or bedside chair)?: Total Help needed to walk in hospital room?: Total Help needed climbing 3-5 steps with a railing? : Total 6 Click Score: 7    End of Session Equipment Utilized During Treatment: Oxygen Activity Tolerance: Patient tolerated treatment well;Patient limited by fatigue Patient left: in bed;with call bell/phone within reach;with bed alarm set;with SCD's reapplied Nurse Communication: Mobility status PT Visit Diagnosis: History of falling (Z91.81);Difficulty in walking, not elsewhere classified (R26.2);Muscle weakness (generalized) (M62.81)     Time: 1007-1100 PT Time Calculation (min) (ACUTE ONLY): 53 min  Charges:  $Therapeutic Exercise: 8-22 mins $Therapeutic Activity: 23-37 mins                    Chesley Noon, PTA 09/24/19, 12:13 PM

## 2019-09-24 NOTE — Plan of Care (Signed)
No acute events this shift. Problem: Clinical Measurements: Goal: Ability to maintain clinical measurements within normal limits will improve Outcome: Progressing Goal: Will remain free from infection Outcome: Progressing Goal: Diagnostic test results will improve Outcome: Progressing Goal: Respiratory complications will improve Outcome: Progressing Goal: Cardiovascular complication will be avoided Outcome: Progressing   Problem: Activity: Goal: Risk for activity intolerance will decrease Outcome: Progressing   Problem: Nutrition: Goal: Adequate nutrition will be maintained Outcome: Progressing   Problem: Elimination: Goal: Will not experience complications related to urinary retention Outcome: Progressing   Problem: Pain Managment: Goal: General experience of comfort will improve Outcome: Progressing

## 2019-09-24 NOTE — Progress Notes (Signed)
[facility]  ROUNDING NOTE   Subjective:   09/12 0701 - 09/13 0700 In: 241.1 [P.O.:200; IV Piggyback:41.1] Out: 2250 [Urine:2250]   Patient found resting in bed, in  no acute distress.Respiration even,unlabored. He continues to report poor oral intake due to low appetite.   Objective:  Vital signs in last 24 hours:  Temp:  [97.9 F (36.6 C)-98.5 F (36.9 C)] 98.5 F (36.9 C) (09/13 0726) Pulse Rate:  [74-86] 86 (09/13 0726) Resp:  [15-20] 18 (09/13 0726) BP: (114-127)/(47-62) 122/47 (09/13 0726) SpO2:  [92 %-96 %] 96 % (09/13 0726)  Weight change:  Filed Weights   09/18/19 0631 09/19/19 0637 09/20/19 1252  Weight: (!) 161.6 kg (!) 159.9 kg (!) 159.9 kg    Intake/Output: I/O last 3 completed shifts: In: 241.1 [P.O.:200; IV Piggyback:41.1] Out: 2800 [Urine:2800]   Intake/Output this shift:  No intake/output data recorded.  Physical Exam: General: Resting in bed, awake,alert  Head: Normocephalic,atraumatic,oral  mucous membrane moist  Lungs:  diminished  Heart: Irregular, no rubs or gallops  Abdomen:  Obese, non tender,non distended  Extremities: 1+ bilateral lower extremity edema  Neurologic: Alert,oriented,answers questions appropriately  Skin: No new lesions or rashes  Access: LIJ temp HD catheter    Basic Metabolic Panel: Recent Labs  Lab 09/18/19 0449 09/19/19 0537 09/20/19 0725 09/20/19 0725 09/21/19 0524 09/21/19 0524 09/22/19 1047 09/23/19 0615 09/24/19 0309  NA   < >  --  142  --  145  --  142 142 144  K   < >  --  3.4*  --  3.8  --  3.6 3.3* 3.3*  CL   < >  --  104  --  108  --  102 103 104  CO2   < >  --  30  --  31  --  29 30 31   GLUCOSE   < >  --  132*  --  168*  --  167* 154* 140*  BUN   < >  --  88*  --  80*  --  78* 85* 82*  CREATININE   < > 3.08* 3.07*  --  2.75*  --  3.12* 3.25* 3.15*  CALCIUM   < >  --  8.4*   < > 8.3*   < > 8.2* 8.6* 8.3*  MG   < > 2.2 2.3  --  2.3  --  2.1 1.9 2.0  PHOS  --  4.2 4.0  --  3.9  --   4.3 4.1  --    < > = values in this interval not displayed.    Liver Function Tests: No results for input(s): AST, ALT, ALKPHOS, BILITOT, PROT, ALBUMIN in the last 168 hours. No results for input(s): LIPASE, AMYLASE in the last 168 hours. No results for input(s): AMMONIA in the last 168 hours.  CBC: Recent Labs  Lab 09/20/19 0725 09/21/19 0524 09/22/19 1047 09/23/19 0615 09/24/19 0309  WBC 9.2 12.8* 13.5* 12.6* 13.4*  NEUTROABS 6.9 10.7* 11.5* 10.6* 11.5*  HGB 7.0* 8.2* 7.7* 8.0* 7.7*  HCT 23.1* 26.5* 25.0* 25.2* 24.2*  MCV 101.8* 100.8* 100.4* 98.1 99.2  PLT 109* 105* 97* 103* 115*    Cardiac Enzymes: No results for input(s): CKTOTAL, CKMB, CKMBINDEX, TROPONINI in the last 168 hours.  BNP: Invalid input(s): POCBNP  CBG: Recent Labs  Lab 09/23/19 0733 09/23/19 1131 09/23/19 1638 09/23/19 2109 09/24/19 0727  GLUCAP 142* 272* 155* 149* 129*    Microbiology: Results for  orders placed or performed during the hospital encounter of 09/08/19  MRSA PCR Screening     Status: None   Collection Time: 09/08/19 11:49 AM   Specimen: Nasopharyngeal  Result Value Ref Range Status   MRSA by PCR NEGATIVE NEGATIVE Final    Comment:        The GeneXpert MRSA Assay (FDA approved for NASAL specimens only), is one component of a comprehensive MRSA colonization surveillance program. It is not intended to diagnose MRSA infection nor to guide or monitor treatment for MRSA infections. Performed at [facility], [address] [place], [place] [address]   SARS Coronavirus 2 by RT PCR (hospital order, performed in [facility] hospital lab) Nasopharyngeal Nasopharyngeal Swab     Status: None   Collection Time: 09/08/19 12:05 PM   Specimen: Nasopharyngeal Swab  Result Value Ref Range Status   SARS Coronavirus 2 NEGATIVE NEGATIVE Final    Comment: (NOTE) SARS-CoV-2 target nucleic acids are NOT DETECTED.  The SARS-CoV-2 RNA is generally detectable in upper and  lower respiratory specimens during the acute phase of infection. The lowest concentration of SARS-CoV-2 viral copies this assay can detect is 250 copies / mL. A negative result does not preclude SARS-CoV-2 infection and should not be used as the sole basis for treatment or other patient management decisions.  A negative result may occur with improper specimen collection / handling, submission of specimen other than nasopharyngeal swab, presence of viral mutation(s) within the areas targeted by this assay, and inadequate number of viral copies (<250 copies / mL). A negative result must be combined with clinical observations, patient history, and epidemiological information.  Fact Sheet for Patients:   StrictlyIdeas.no  Fact Sheet for Healthcare Providers: BankingDealers.co.za  This test is not yet approved or  cleared by the [place] FDA and has been authorized for detection and/or diagnosis of SARS-CoV-2 by FDA under an Emergency Use Authorization (EUA).  This EUA will remain in effect (meaning this test can be used) for the duration of the COVID-19 declaration under Section 564(b)(1) of the Act, 21 U.S.C. section 360bbb-3(b)(1), unless the authorization is terminated or revoked sooner.  Performed at [facility], [address] [place], [place] [address]   Blood culture (routine x 2)     Status: Abnormal   Collection Time: 09/08/19 12:07 PM   Specimen: BLOOD  Result Value Ref Range Status   Specimen Description   Final    BLOOD L DISTAL FOREARM Performed at [facility], [address] [place], [place] [address]    Special Requests   Final    BOTTLES DRAWN AEROBIC AND ANAEROBIC Blood Culture results may not be optimal due to an excessive volume of blood received in culture bottles Performed at [facility], [address], [place], [place] [address]    Culture  Setup Time   Final    GRAM  POSITIVE COCCI AEROBIC BOTTLE ONLY CRITICAL RESULT CALLED TO, READ BACK BY AND VERIFIED WITH: MORGAN CUNNINGHAM  AT 8299 ON 09/09/19 SNG    Culture (A)  Final    STAPHYLOCOCCUS EPIDERMIDIS THE SIGNIFICANCE OF ISOLATING THIS ORGANISM FROM A SINGLE SET OF BLOOD CULTURES WHEN MULTIPLE SETS ARE DRAWN IS UNCERTAIN. PLEASE NOTIFY THE MICROBIOLOGY DEPARTMENT WITHIN ONE WEEK IF SPECIATION AND SENSITIVITIES ARE REQUIRED. Performed at [facility], [place] [address] [place],  [address]    Report Status 09/11/2019 FINAL  Final  Blood Culture ID Panel (Reflexed)     Status:  Abnormal   Collection Time: 09/08/19 12:07 PM  Result Value Ref Range Status   Enterococcus faecalis NOT DETECTED NOT DETECTED Final   Enterococcus Faecium NOT DETECTED NOT DETECTED Final   Listeria monocytogenes NOT DETECTED NOT DETECTED Final   Staphylococcus species DETECTED (A) NOT DETECTED Final    Comment: CRITICAL RESULT CALLED TO, READ BACK BY AND VERIFIED WITH: MORGAN CUNNINGHAM AT 1345 ON 09/09/19 SNG    Staphylococcus aureus (BCID) NOT DETECTED NOT DETECTED Final   Staphylococcus epidermidis DETECTED (A) NOT DETECTED Final    Comment: CRITICAL RESULT CALLED TO, READ BACK BY AND VERIFIED WITH: MORGAN CUNNINGHAM AT 1345 ON 09/09/19 SNG    Staphylococcus lugdunensis NOT DETECTED NOT DETECTED Final   Streptococcus species NOT DETECTED NOT DETECTED Final   Streptococcus agalactiae NOT DETECTED NOT DETECTED Final   Streptococcus pneumoniae NOT DETECTED NOT DETECTED Final   Streptococcus pyogenes NOT DETECTED NOT DETECTED Final   A.calcoaceticus-baumannii NOT DETECTED NOT DETECTED Final   Bacteroides fragilis NOT DETECTED NOT DETECTED Final   Enterobacterales NOT DETECTED NOT DETECTED Final   Enterobacter cloacae complex NOT DETECTED NOT DETECTED Final   Escherichia coli NOT DETECTED NOT DETECTED Final   Klebsiella aerogenes NOT DETECTED NOT DETECTED Final   Klebsiella oxytoca NOT DETECTED NOT DETECTED Final    Klebsiella pneumoniae NOT DETECTED NOT DETECTED Final   Proteus species NOT DETECTED NOT DETECTED Final   Salmonella species NOT DETECTED NOT DETECTED Final   Serratia marcescens NOT DETECTED NOT DETECTED Final   Haemophilus influenzae NOT DETECTED NOT DETECTED Final   Neisseria meningitidis NOT DETECTED NOT DETECTED Final   Pseudomonas aeruginosa NOT DETECTED NOT DETECTED Final   Stenotrophomonas maltophilia NOT DETECTED NOT DETECTED Final   Candida albicans NOT DETECTED NOT DETECTED Final   Candida auris NOT DETECTED NOT DETECTED Final   Candida glabrata NOT DETECTED NOT DETECTED Final   Candida krusei NOT DETECTED NOT DETECTED Final   Candida parapsilosis NOT DETECTED NOT DETECTED Final   Candida tropicalis NOT DETECTED NOT DETECTED Final   Cryptococcus neoformans/gattii NOT DETECTED NOT DETECTED Final   Methicillin resistance mecA/C NOT DETECTED NOT DETECTED Final    Comment: Performed at [facility], [address] [place], [place] [address]  Blood culture (routine x 2)     Status: None   Collection Time: 09/08/19 12:59 PM   Specimen: BLOOD  Result Value Ref Range Status   Specimen Description BLOOD LEFT UPPER FORARM  Final   Special Requests   Final    BOTTLES DRAWN AEROBIC AND ANAEROBIC Blood Culture results may not be optimal due to an excessive volume of blood received in culture bottles   Culture   Final    NO GROWTH 5 DAYS Performed at [facility], [address] [place], [place] [address]    Report Status 09/13/2019 FINAL  Final    Coagulation Studies: No results for input(s): LABPROT, INR in the last 72 hours.  Urinalysis: No results for input(s): COLORURINE, LABSPEC, PHURINE, GLUCOSEU, HGBUR, BILIRUBINUR, KETONESUR, PROTEINUR, UROBILINOGEN, NITRITE, LEUKOCYTESUR in the last 72 hours.  Invalid input(s): APPERANCEUR    Imaging: No results found.   Medications:    albumin human 12.5 g (09/23/19 0838)    (feeding supplement)  PROSource Plus  30 mL Oral TID WC   sodium chloride   Intravenous Once   Chlorhexidine Gluconate Cloth  6 each Topical Daily   citalopram  10 mg Oral Daily   collagenase   Topical Daily   epoetin (EPOGEN/PROCRIT)  injection  20,000 Units Subcutaneous Weekly   feeding supplement (NEPRO CARB STEADY)  237 mL Oral TID BM   fludrocortisone  0.2 mg Oral BID   insulin aspart  0-5 Units Subcutaneous QHS   insulin aspart  0-9 Units Subcutaneous TID WC   levothyroxine  175 mcg Oral Daily   mouth rinse  15 mL Mouth Rinse BID   midodrine  10 mg Oral TID WC   multivitamin with minerals  1 tablet Oral Daily   nystatin cream   Topical BID   pantoprazole (PROTONIX) IV  40 mg Intravenous Q12H   senna  1 tablet Oral BID   zinc oxide  1 application Topical Daily   acetaminophen **OR** acetaminophen, heparin sodium (porcine), ipratropium-albuterol, ondansetron **OR** ondansetron (ZOFRAN) IV, ondansetron (ZOFRAN) IV, polyethylene glycol  Assessment/ Plan:  Mr. Andrew Valentine is a 79 y.o. 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  Baseline Creatinine 1.5  Nonoliguric urine output. 09/12 0701 - 09/13 0700 In: 241.1 [P.O.:200; IV Piggyback:41.1] Out: 2250 [Urine:2250]   - Required renal replacement therapy. CVVHDF from 8/29 to 9/2 - IHD on 9/4 -Urine output adequate,2200  ml in the preceding 24 hours -Fluid and electrolyte status acceptable- -No dialysis required -Plan to remove right IJ access today  # Hypotension Blood pressure reading in low normal range Required vasopressors during hospital stay- norepinephrine, cardiogenic and septic shock from pneumonia -Continue midodrine and fludrocortisone.   #Bacterial pneumonia Patient has underlying pulmonary hypertension, obstructive sleep apnea and morbid obesity.  Previously treated. Chest Xray on 09/17/19  IMPRESSION: Stable bilateral pulmonary edema.   #Anemia of chronic disease Lab Results  Component Value Date   HGB 7.7 (L) 09/24/2019    Hemoglobin stays relatively stable. GI team following up on gastric ulcer Patient is on PPI Maintain the patient on Epogen Consider transfusion PRN No acute hemodynamic changes today     LOS: 16 Allicia Culley 9/13/20219:18 AM

## 2019-09-24 NOTE — Progress Notes (Signed)
Andrew Valentine , MD [address] [place], [place], [place], [address] [address], [place], [place], [place], [address] Phone: [phone]  Fax: [phone]   Andrew Valentine is being followed for melena   Subjective:  In room with family , some black colored stool    Objective: Vital signs in last 24 hours: Vitals:   09/23/19 1516 09/23/19 1640 09/23/19 2345 09/24/19 0726  BP: 114/61  127/62 (!) 122/47  Pulse: 79  74 86  Resp: 15  20 18   Temp: 97.9 F (36.6 C)  98.1 F (36.7 C) 98.5 F (36.9 C)  TempSrc: Oral  Oral Oral  SpO2: 92% 94% 93% 96%  Weight:      Height:       Weight change:   Intake/Output Summary (Last 24 hours) at 09/24/2019 1316 Last data filed at 09/24/2019 8502 Gross per 24 hour  Intake 200 ml  Output 1550 ml  Net -1350 ml     Exam:    Lab Results: @LABTEST2 @ Micro Results: No results found for this or any previous visit (from the past 240 hour(s)). Studies/Results: No results found. Medications:  Prior to Admission:  Medications Prior to Admission  Medication Sig Dispense Refill Last Dose  . atenolol (TENORMIN) 100 MG tablet Take 100 mg by mouth daily.     Marland Kitchen atorvastatin (LIPITOR) 40 MG tablet Take 40 mg by mouth daily.     Marland Kitchen glipiZIDE (GLUCOTROL) 5 MG tablet Take 5 mg by mouth daily.     . hydrochlorothiazide (HYDRODIURIL) 25 MG tablet Take 25 mg by mouth daily.     Marland Kitchen levothyroxine (SYNTHROID) 175 MCG tablet Take 175 mcg by mouth daily.     Marland Kitchen lisinopril (ZESTRIL) 5 MG tablet Take 5 mg by mouth daily.      Scheduled Meds: . (feeding supplement) PROSource Plus  30 mL Oral TID WC  . sodium chloride   Intravenous Once  . Chlorhexidine Gluconate Cloth  6 each Topical Daily  . citalopram  10 mg Oral Daily  . collagenase   Topical Daily  . epoetin (EPOGEN/PROCRIT) injection  20,000 Units Subcutaneous Weekly  . feeding supplement (NEPRO CARB STEADY)  237 mL Oral TID BM  . fludrocortisone  0.2 mg Oral BID  . insulin aspart  0-5 Units  Subcutaneous QHS  . insulin aspart  0-9 Units Subcutaneous TID WC  . levothyroxine  175 mcg Oral Daily  . mouth rinse  15 mL Mouth Rinse BID  . midodrine  10 mg Oral TID WC  . multivitamin with minerals  1 tablet Oral Daily  . nystatin cream   Topical BID  . pantoprazole (PROTONIX) IV  40 mg Intravenous Q12H  . potassium chloride  40 mEq Oral Once  . senna  1 tablet Oral BID  . zinc oxide  1 application Topical Daily   Continuous Infusions: . iron sucrose     PRN Meds:.acetaminophen **OR** acetaminophen, heparin sodium (porcine), ipratropium-albuterol, ondansetron **OR** ondansetron (ZOFRAN) IV, ondansetron (ZOFRAN) IV, polyethylene glycol   Assessment: Active Problems:   Acute hypoxemic respiratory failure (HCC)   Acute renal failure (HCC)   Hyperkalemia   NSTEMI (non-ST elevated myocardial infarction) (HCC)   Pressure injury of skin   Pulmonary hypertension, unspecified (HCC)   Persistent atrial fibrillation (HCC)   Anemia   Acute right-sided CHF (congestive heart failure) (HCC)   Acute blood loss anemia  Andrew Valentine 79 y.o. male s/p EGD with Dr Marius Ditch on 09/20/2019- clean based ulcer noted. Being  followed back for 1 dark bowel movement . Hb stable last 3 days. Ulcers seen on index EGD were very low risk for rebleeding and were very tiny Always possible that he may have a small bowel or colon source. He has sacral ulcers and a bowel prep would be very hard to undergo for him   Plan: 1. Closely monitor Hb- expect melena to stop if itsold blood. If there is a good drop in Hb that cant be explained then may need to reconsider endoscopy  I will follow closely    LOS: 16 days   Andrew Bellows, MD 09/24/2019, 1:16 PM

## 2019-09-24 NOTE — Progress Notes (Signed)
PROGRESS NOTE    Andrew Valentine  CWC:[id number] DOB: 04-23-1940 DOA: 09/08/2019 PCP: [facility]  Chief complaint.  Shortness of breath.  Brief Narrative:  13M hx of Morbid obesity BMI >50, CHF, DM, hypothoroidism, came from home due to AKI. He lives alone and cannot take care of himself. He was noted to be lethargic in ED with hypercapnia and hypoxemia. Daughter she states she came to check on him and blood glucose was 47, he was unable to get out of chair. He was weak could not walk, desaturating 70s. He was encephalopathic at this time. Daughter lives next door.Was admitted to ICU initially. Patient had a significant weight gain over the last 2 weeks. He has anasarca. He was placed on CRRT. 09/10/19- patient is awake this am, he on levphed 25, hes on CRRT with dyasylate modifcation this am by renal team. S/p wound care eval, UOP has improved.  8/31- patient weaned off vasopressin, weaned levophed from 25>>10.  9/1- patient was evaluated by PALS, he does not wish to have permanent dialysis. Code status discussion in progress.  9/2- Plan to remove Left IJ HD catheter, trial of Lasix as per Nephrology 9/3- patient reports feeling well, he ate dinner states he feels well. S/p vascular eval for perm cath placement. 1 unit prbc transfustion ordered, heparin stopped due to borderline low h/h 9/4 medical therapy  9/8.Patient had a significant black stool, positive occult blood. Change Protonix to 40 mg IV twice a day, GI consult. He has been making large amount of urine, has not been dialyzed since 9/4.  9/9.EGD showed multiple gastric ulcers. No active bleeding. Hemoglobin dropped down to 7.0, transfuse 1 unit PRBC.  9/10.Hemoglobin stable, creatinine better at2.75.  9/12.  Patient continued to have black stools, hemoglobin dropped down to 7.7 yesterday.  Restart Protonix IV twice a day.  Ask GI to see patient again.  9/13.  Goals of care discussion with  the family, see today's progress note.   Assessment & Plan:   Active Problems:   Acute hypoxemic respiratory failure (HCC)   Acute renal failure (HCC)   Hyperkalemia   NSTEMI (non-ST elevated myocardial infarction) (HCC)   Pressure injury of skin   Pulmonary hypertension, unspecified (HCC)   Persistent atrial fibrillation (HCC)   Anemia   Acute right-sided CHF (congestive heart failure) ([place])   Acute blood loss anemia  Goal of of care discussion. Had a family meeting with the presence of son, daughter, physical therapist and Education officer, museum. Patient has significant illness affecting his prognosis.  He has a severe pulmonary hypertension with anasarca and right-sided congestive heart failure, renal function is getting worse, he also has GI bleed, which appears to be continuous.  His long-term prognosis may be poor due to heart and kidney condition.  Decision was made to continue physical therapy and current treatment.  Plan to transfer patient to SNF for rehab for short-term, if condition does not improve or deteriorates, patient can be transferred to hospice care.  #1.  Acute hypoxemic respiratory failure. Appears to be multifactorial, due to severe pulmonary hypertension, right-sided congestive heart failure, morbid obesity with obesity hypoventilation syndrome, obstructive sleep apnea as well as aspiration pneumonia. Patient currently is stable on 2 L oxygen.  He deteriorates unexpectantly.  We will continue current oxygen treatment.  2.  Acute blood loss anemia secondary to upper GI bleed and secondary to gastric ulcers. Patient hemoglobin today 7.7, continue to have black stools.  Discussed with Dr. Vicente Males from GI,  patient hemoglobin is relatively stable, no plan for repeat EGD.  We will continue IV Protonix twice a day.  3.  Acute kidney injury on chronic kidney disease stage IIIb. Worsening renal function may be related to GI bleed.  Patient was also very sensitive to diuretics.  At  this point, he is not on any diuretics.  Kidney function seem to be stable today.  Discussed with Dr. Holley Raring, planning to remove temporary dialysis catheter and a Foley catheter.  4.  Acute right-sided congestive heart failure with anasarca and severe pulmonary hypertension. Currently stable.  5.  Hypotension. Resolved  6.  Obstructive sleep apnea. Continue CPAP while asleep.  7.  Sacrum decubitus ulcer unstageable.  Patient had a stage IV sacral pressure ulcer at the time of admission, now has progressed to unstageable ulcer.  Wound care has seen the patient, recommend frequent turning and measures has changed.    DVT prophylaxis: SCDs Code Status: DNR Family Communication: Had a family meeting with patient daughter and son.  .   Status is: Inpatient  Remains inpatient appropriate because:Inpatient level of care appropriate due to severity of illness   Dispo: The patient is from: SNF              Anticipated d/c is to: SNF              Anticipated d/c date is: 3 days              Patient currently is not medically stable to d/c.        I/O last 3 completed shifts: In: 241.1 [P.O.:200; IV Piggyback:41.1] Out: 2800 [Urine:2800] No intake/output data recorded.     Consultants:   GI, nephrology  Procedures: None  Antimicrobials:None  Subjective: Patient still on 2 L oxygen, does not have signal short of breath.  Patient has a severe weakness, take 2 people to sit him at the bedside, not able to stand. He has some black stools again yesterday.  No nausea vomiting, good appetite. No fever or chills. No dysuria hematuria.  Foley catheter still in place.  Objective: Vitals:   09/23/19 1516 09/23/19 1640 09/23/19 2345 09/24/19 0726  BP: 114/61  127/62 (!) 122/47  Pulse: 79  74 86  Resp: 15  20 18   Temp: 97.9 F (36.6 C)  98.1 F (36.7 C) 98.5 F (36.9 C)  TempSrc: Oral  Oral Oral  SpO2: 92% 94% 93% 96%  Weight:      Height:        Intake/Output Summary  (Last 24 hours) at 09/24/2019 1235 Last data filed at 09/24/2019 6295 Gross per 24 hour  Intake 200 ml  Output 1550 ml  Net -1350 ml   Filed Weights   09/18/19 0631 09/19/19 0637 09/20/19 1252  Weight: (!) 161.6 kg (!) 159.9 kg (!) 159.9 kg    Examination:  General exam: Appears calm and comfortable  Respiratory system: Clear to auscultation. Respiratory effort normal. Cardiovascular system: S1 & S2 heard, RRR. No JVD, murmurs, rubs, gallops or clicks. 1+ pedal edema.  Anasarca Gastrointestinal system: Abdomen is nondistended, soft and nontender. No organomegaly or masses felt. Normal bowel sounds heard. Central nervous system: Alert and oriented x3. No focal neurological deficits. Extremities: Symmetric  Skin: Sacrum decubitus ulcer noted. Psychiatry:  Mood & affect appropriate.     Data Reviewed: I have personally reviewed following labs and imaging studies  CBC: Recent Labs  Lab 09/20/19 0725 09/21/19 0524 09/22/19 1047 09/23/19 0615 09/24/19 0309  WBC 9.2 12.8* 13.5* 12.6* 13.4*  NEUTROABS 6.9 10.7* 11.5* 10.6* 11.5*  HGB 7.0* 8.2* 7.7* 8.0* 7.7*  HCT 23.1* 26.5* 25.0* 25.2* 24.2*  MCV 101.8* 100.8* 100.4* 98.1 99.2  PLT 109* 105* 97* 103* 710*   Basic Metabolic Panel: Recent Labs  Lab 09/18/19 0449 09/19/19 0537 09/20/19 0725 09/21/19 0524 09/22/19 1047 09/23/19 0615 09/24/19 0309  NA   < >  --  142 145 142 142 144  K   < >  --  3.4* 3.8 3.6 3.3* 3.3*  CL   < >  --  104 108 102 103 104  CO2   < >  --  30 31 29 30 31   GLUCOSE   < >  --  132* 168* 167* 154* 140*  BUN   < >  --  88* 80* 78* 85* 82*  CREATININE   < > 3.08* 3.07* 2.75* 3.12* 3.25* 3.15*  CALCIUM   < >  --  8.4* 8.3* 8.2* 8.6* 8.3*  MG   < > 2.2 2.3 2.3 2.1 1.9 2.0  PHOS  --  4.2 4.0 3.9 4.3 4.1  --    < > = values in this interval not displayed.   GFR: Estimated Creatinine Clearance: 28.2 mL/min (A) (by C-G formula based on SCr of 3.15 mg/dL (H)). Liver Function Tests: No results for  input(s): AST, ALT, ALKPHOS, BILITOT, PROT, ALBUMIN in the last 168 hours. No results for input(s): LIPASE, AMYLASE in the last 168 hours. No results for input(s): AMMONIA in the last 168 hours. Coagulation Profile: No results for input(s): INR, PROTIME in the last 168 hours. Cardiac Enzymes: No results for input(s): CKTOTAL, CKMB, CKMBINDEX, TROPONINI in the last 168 hours. BNP (last 3 results) No results for input(s): PROBNP in the last 8760 hours. HbA1C: No results for input(s): HGBA1C in the last 72 hours. CBG: Recent Labs  Lab 09/23/19 1131 09/23/19 1638 09/23/19 2109 09/24/19 0727 09/24/19 1149  GLUCAP 272* 155* 149* 129* 143*   Lipid Profile: No results for input(s): CHOL, HDL, LDLCALC, TRIG, CHOLHDL, LDLDIRECT in the last 72 hours. Thyroid Function Tests: No results for input(s): TSH, T4TOTAL, FREET4, T3FREE, THYROIDAB in the last 72 hours. Anemia Panel: No results for input(s): VITAMINB12, FOLATE, FERRITIN, TIBC, IRON, RETICCTPCT in the last 72 hours. Sepsis Labs: No results for input(s): PROCALCITON, LATICACIDVEN in the last 168 hours.  No results found for this or any previous visit (from the past 240 hour(s)).       Radiology Studies: No results found.      Scheduled Meds: . (feeding supplement) PROSource Plus  30 mL Oral TID WC  . sodium chloride   Intravenous Once  . Chlorhexidine Gluconate Cloth  6 each Topical Daily  . citalopram  10 mg Oral Daily  . collagenase   Topical Daily  . epoetin (EPOGEN/PROCRIT) injection  20,000 Units Subcutaneous Weekly  . feeding supplement (NEPRO CARB STEADY)  237 mL Oral TID BM  . fludrocortisone  0.2 mg Oral BID  . insulin aspart  0-5 Units Subcutaneous QHS  . insulin aspart  0-9 Units Subcutaneous TID WC  . levothyroxine  175 mcg Oral Daily  . mouth rinse  15 mL Mouth Rinse BID  . midodrine  10 mg Oral TID WC  . multivitamin with minerals  1 tablet Oral Daily  . nystatin cream   Topical BID  . pantoprazole  (PROTONIX) IV  40 mg Intravenous Q12H  . potassium chloride  40 mEq  Oral Once  . senna  1 tablet Oral BID  . zinc oxide  1 application Topical Daily   Continuous Infusions: . iron sucrose       LOS: 16 days    Time spent: 46 minutes    Sharen Hones, MD [facility]   To contact the attending provider between 7A-7P or the covering provider during after hours 7P-7A, please log into the web site [email or web address] and access using universal [place] password for that web site. If you do not have the password, please call the hospital operator.  09/24/2019, 12:35 PM

## 2019-09-24 NOTE — Progress Notes (Signed)
°   09/24/19 1300  Clinical Encounter Type  Visited With Patient and family together  Visit Type Initial;Spiritual support;Social support  Referral From Nurse  Consult/Referral To Chaplain  Ch responded to [facility] for a AD. I did AD education with Pt and family and informed them that when they are ready to finish the AD PG the Ch. Pt ask Ch to pray for him. Ch prayed for PT and family. Ch will follow-up.

## 2019-09-24 NOTE — Addendum Note (Signed)
Addendum  created 09/24/19 1612 by Gaynelle Cage, CRNA   Intraprocedure Staff edited

## 2019-09-24 NOTE — Consult Note (Signed)
[place] Nurse Consult Note: Reason for Consult: Consult requested for sacrum.  WOC consult was previously performed on 8/30.  Pt had a deep tissue pressure injury and scattered areas of partial thickness skin loss related to moisture associated skin damage at that time.  Wound type: Previously noted deep tissue injury has evolved into an unstageable pressure injury to sacrum/located deep in the inner gluteal fold.  Pt has a high BMI and is very difficult to reposition.  He is frequently incontinent of liquid stool and it is difficult to keep the affected area from becoming soiled.  Pressure Injury POA: Yes Measurement: 3X3cm Wound bed: yellow slough, moist with small amt yellow drainage, no odor Periwound: previously noted moisture associated skin damage has resolved to the surrounding areas Dressing procedure/placement/frequency: Topical treatment orders provided for bedside nurses to perform as follows to assist with enzymatic debridement and promote healing as follows: Apply Santyl to inner gluteal fold wound Q day, then cover with moist 2X2 and foam dressing. (Change foam dressing Q 3 days or PRN soiling.) Please re-consult if further assistance is needed.  Thank-you,  Julien Girt MSN, [place], [place], [place], [place]

## 2019-09-24 NOTE — Care Management Important Message (Signed)
Important Message  Patient Details  Name: Andrew Valentine MRN: [id number] Date of Birth: 05-08-1940   Medicare Important Message Given:  Yes     Dannette Barbara 09/24/2019, 12:03 PM

## 2019-09-25 DIAGNOSIS — N189 Chronic kidney disease, unspecified: Secondary | ICD-10-CM

## 2019-09-25 LAB — CBC WITH DIFFERENTIAL/PLATELET
Abs Immature Granulocytes: 0.12 10*3/uL — ABNORMAL HIGH (ref 0.00–0.07)
Basophils Absolute: 0 10*3/uL (ref 0.0–0.1)
Basophils Relative: 0 %
Eosinophils Absolute: 0.3 10*3/uL (ref 0.0–0.5)
Eosinophils Relative: 2 %
HCT: 25.3 % — ABNORMAL LOW (ref 39.0–52.0)
Hemoglobin: 7.6 g/dL — ABNORMAL LOW (ref 13.0–17.0)
Immature Granulocytes: 1 %
Lymphocytes Relative: 3 %
Lymphs Abs: 0.5 10*3/uL — ABNORMAL LOW (ref 0.7–4.0)
MCH: 31 pg (ref 26.0–34.0)
MCHC: 30 g/dL (ref 30.0–36.0)
MCV: 103.3 fL — ABNORMAL HIGH (ref 80.0–100.0)
Monocytes Absolute: 0.7 10*3/uL (ref 0.1–1.0)
Monocytes Relative: 5 %
Neutro Abs: 14.3 10*3/uL — ABNORMAL HIGH (ref 1.7–7.7)
Neutrophils Relative %: 89 %
Platelets: 120 10*3/uL — ABNORMAL LOW (ref 150–400)
RBC: 2.45 MIL/uL — ABNORMAL LOW (ref 4.22–5.81)
RDW: 18.7 % — ABNORMAL HIGH (ref 11.5–15.5)
WBC: 16 10*3/uL — ABNORMAL HIGH (ref 4.0–10.5)
nRBC: 0 % (ref 0.0–0.2)

## 2019-09-25 LAB — BASIC METABOLIC PANEL
Anion gap: 11 (ref 5–15)
BUN: 81 mg/dL — ABNORMAL HIGH (ref 8–23)
CO2: 31 mmol/L (ref 22–32)
Calcium: 8.4 mg/dL — ABNORMAL LOW (ref 8.9–10.3)
Chloride: 102 mmol/L (ref 98–111)
Creatinine, Ser: 3.23 mg/dL — ABNORMAL HIGH (ref 0.61–1.24)
GFR calc Af Amer: 20 mL/min — ABNORMAL LOW (ref >60)
GFR calc non Af Amer: 17 mL/min — ABNORMAL LOW (ref >60)
Glucose, Bld: 157 mg/dL — ABNORMAL HIGH (ref 70–99)
Potassium: 3.4 mmol/L — ABNORMAL LOW (ref 3.5–5.1)
Sodium: 144 mmol/L (ref 135–145)

## 2019-09-25 LAB — GLUCOSE, CAPILLARY
Glucose-Capillary: 161 mg/dL — ABNORMAL HIGH (ref 70–99)
Glucose-Capillary: 162 mg/dL — ABNORMAL HIGH (ref 70–99)
Glucose-Capillary: 175 mg/dL — ABNORMAL HIGH (ref 70–99)
Glucose-Capillary: 185 mg/dL — ABNORMAL HIGH (ref 70–99)

## 2019-09-25 LAB — MAGNESIUM: Magnesium: 2.1 mg/dL (ref 1.7–2.4)

## 2019-09-25 MED ORDER — POTASSIUM CHLORIDE 20 MEQ PO PACK
40.0000 meq | PACK | Freq: Once | ORAL | Status: AC
  Administered 2019-09-25: 40 meq via ORAL
  Filled 2019-09-25: qty 2

## 2019-09-25 NOTE — Progress Notes (Signed)
Progress Note  Patient Name: Andrew Valentine Date of Encounter: 09/25/2019  Primary Cardiologist: New to [facility] - consult by [place]  Subjective   Please see full consult note earlier this admission dated 09/09/2019.  Patient was last evaluated this admission by cardiology on 09/16/2019 with well-controlled heart rate of AV nodal blocking agents with continued anemia off anticoagulation with recommendation for this to continue to be held unless source of bleeding or anemia resolved.  With regards to his pulmonary hypertension, this is felt to be multifactorial including OSA, OHS, and morbid obesity with volume management per dialysis.   Cardiology asked to reevaluate the patient for consideration of hospice appropriateness on 09/25/2019.  Since patient was last seen he had significant melena on 9/8 with subsequent EGD on 9/9 demonstrating multiple gastric ulcers without active bleeding.  He required packed red blood cell transfusion.  On 9/12, he continued to have melena with continued drop in hemoglobin with recommendation for GI to evaluate patient.  [place] discussion was held on 9/13 with recommendation to continue current level of treatment at that time and if patient did not improve or continued to deteriorate consider transfer to hospice care.  He reports continued intermit dyspnea, particularly when working with PT.  With this, he also notes some intermittent tachypalpitations.  No chest pain.  No longer requiring hemodialysis though renal function remains significantly elevated and is uptrending at this time.  He continues to note intermittent melena with downtrending hemoglobin.  He remains on supplemental oxygen and reports compliance with nocturnal CPAP.  He remains on telemetry which demonstrates A. fib with controlled ventricular response.  Inpatient Medications    Scheduled Meds: . (feeding supplement) PROSource Plus  30 mL Oral TID WC  . sodium chloride   Intravenous Once  . Chlorhexidine  Gluconate Cloth  6 each Topical Daily  . citalopram  10 mg Oral Daily  . collagenase   Topical Daily  . epoetin (EPOGEN/PROCRIT) injection  20,000 Units Subcutaneous Weekly  . feeding supplement (NEPRO CARB STEADY)  237 mL Oral TID BM  . fludrocortisone  0.2 mg Oral BID  . insulin aspart  0-5 Units Subcutaneous QHS  . insulin aspart  0-9 Units Subcutaneous TID WC  . levothyroxine  175 mcg Oral Daily  . mouth rinse  15 mL Mouth Rinse BID  . midodrine  10 mg Oral TID WC  . multivitamin with minerals  1 tablet Oral Daily  . nystatin cream   Topical BID  . pantoprazole (PROTONIX) IV  40 mg Intravenous Q12H  . senna  1 tablet Oral BID  . zinc oxide  1 application Topical Daily   Continuous Infusions:  PRN Meds: acetaminophen **OR** acetaminophen, heparin sodium (porcine), ipratropium-albuterol, ondansetron **OR** ondansetron (ZOFRAN) IV, ondansetron (ZOFRAN) IV, polyethylene glycol   Vital Signs    Vitals:   09/24/19 0726 09/24/19 1528 09/25/19 0130 09/25/19 0750  BP: (!) 122/47 (!) 114/57  (!) 132/59  Pulse: 86 95 96 97  Resp: 18 19 19 19   Temp: 98.5 F (36.9 C) 97.8 F (36.6 C)  97.6 F (36.4 C)  TempSrc: Oral Oral  Oral  SpO2: 96% 93%  93%  Weight:      Height:        Intake/Output Summary (Last 24 hours) at 09/25/2019 1131 Last data filed at 09/25/2019 1006 Gross per 24 hour  Intake 120 ml  Output 1225 ml  Net -1105 ml   Filed Weights   09/18/19 0631 09/19/19 6283 09/20/19 1252  Weight: (!) 161.6 kg (!) 159.9 kg (!) 159.9 kg    Telemetry    A. fib with controlled ventricular response- Personally Reviewed  ECG    No tracings in Epic available for review- Personally Reviewed  Physical Exam   GEN: No acute distress.   Neck: JVD difficult to assess secondary to body habitus though appears to be mildly elevated. Cardiac:  Irregularly irregular, no murmurs, rubs, or gallops.  Respiratory:  Patient unable to sit up for posterior chest exam, therefore lungs were  auscultated anteriorly with diminished breath sounds bilaterally.  GI:  Morbidly obese, soft, nontender, non-distended.   MS: No edema; No deformity. Neuro:  Alert and oriented x 3; Nonfocal.  Psych: Normal affect.  Labs    Chemistry Recent Labs  Lab 09/23/19 0615 09/24/19 0309 09/25/19 0639  NA 142 144 144  K 3.3* 3.3* 3.4*  CL 103 104 102  CO2 30 31 31   GLUCOSE 154* 140* 157*  BUN 85* 82* 81*  CREATININE 3.25* 3.15* 3.23*  CALCIUM 8.6* 8.3* 8.4*  GFRNONAA 17* 18* 17*  GFRAA 20* 21* 20*  ANIONGAP 9 9 11      Hematology Recent Labs  Lab 09/23/19 0615 09/24/19 0309 09/25/19 0639  WBC 12.6* 13.4* 16.0*  RBC 2.57* 2.44* 2.45*  HGB 8.0* 7.7* 7.6*  HCT 25.2* 24.2* 25.3*  MCV 98.1 99.2 103.3*  MCH 31.1 31.6 31.0  MCHC 31.7 31.8 30.0  RDW 18.4* 18.5* 18.7*  PLT 103* 115* 120*    Cardiac EnzymesNo results for input(s): TROPONINI in the last 168 hours. No results for input(s): TROPIPOC in the last 168 hours.   BNPNo results for input(s): BNP, PROBNP in the last 168 hours.   DDimer No results for input(s): DDIMER in the last 168 hours.   Radiology    No results found.  Cardiac Studies   2D echo 09/09/2019: 1. Left ventricular ejection fraction, by estimation, is 55 to 60%. The  left ventricle has normal function. The left ventricle has no regional  wall motion abnormalities. Left ventricular diastolic parameters are  indeterminate. Septal flattening in  systole consistent with elevated right heart pressures.  2. Right ventricular systolic function was not well visualized. The right  ventricular size is moderately enlarged. There is severely elevated  pulmonary artery systolic pressure. The estimated right ventricular  systolic pressure is 52.8 mmHg.  3. Left atrial size was mildly dilated.   Patient Profile     79 y.o. male with history of morbid obesity, OSA, OHS not compliant with CPAP, and DM admitted with acute hypercarbic respiratory failure in the  setting of PNA and PAH noted to be in new onset Afib with admission complicated by ongoing melena and anemia requiring intermittent pRBCs, ARF in the setting of CKD stage IIIb requiring HD this admission who we are asked to reevaluate for recommendations regarding his pulmonary hypertension.  Assessment & Plan    1. Acute hypoxic and hypercarbic respiratory failure: -Multifactorial including PAH, morbid obesity with OSA and OHS with noncompliance of CPAP, recent pneumonia, and ongoing blood loss anemia -Overall, the patient's prognosis has been felt to be quite poor given his multiple comorbid conditions and acute multifactorial illness -Lengthy discussion with patient this afternoon regarding treatment options including aggressive evaluation such as cardiac cath versus continued scope of medical care with close follow-up versus transitioning to hospice.  With this, patient indicates he does not want aggressive measures and would like to have some time to think about and discuss with  his family regarding ongoing care at current scope versus transitioning to hospice.  This can be revisited in rounds moving forward -Appreciate internal medicine and palliative care input  2. PAH: -Felt to be in the context of morbid obesity, OSA, and OHS with CPAP noncompliance  -Volume has been managed by transient HD in the setting of ARF this admission, though currently off HD -No plans for RHC at this time per patient wishes, with this we are unable to escalate his pulmonary hypertension medical therapy further at this time therefore, will not start Revatio without knowing left heart pressures -Consider restarting gentle diuresis with close monitoring of renal function -With BP in the 1 teens to 130s and in the setting of his comorbid conditions consider tapering off fludrocortisone/midodrine s -With patient's acute illness and comorbid conditions it is doubtful he would be a candidate for advanced heart failure  modalities.  That said, should he want to pursue continued scope of care and if he improves with this this can be revisited in hospital follow-up accordingly -Recommend strict I's and O's and daily weights while patient is finalizing his goals of care  3. Persistent Afib: -He remains in A. fib with controlled ventricular response not requiring AV nodal blocking agents -Discontinue telemetry -CHADS2VASc at least 5 -[place] held with ongoing GI bleeding and anemia requiring pRBC  4. GI bleeding/blood loss anemia: -He has required pRBC transfusions this admission with HGB continuing to down trend -In the setting of ongoing GI bleeding and anemia, OAC has been held -Management per IM  5. ARF with CKD stage IIIb: -Transient HD this admission, with no further plans of HD at this time -SCr remains above baseline -He indicates he would not want hemodialysis long-term -Management per nephrology  6. Hypokalemia: -Being orally repleted   For questions or updates, please contact [place] Please consult [email or web address] for contact info under Cardiology/STEMI.    Signed, Christell Faith, PA-C [place] Pager: [phone] 09/25/2019, 11:31 AM

## 2019-09-25 NOTE — Progress Notes (Signed)
PROGRESS NOTE    Andrew Valentine  TXM:[id number] DOB: Oct 19, 1940 DOA: 09/08/2019 PCP: [facility]   Chief complaint.  Shortness of breath. Brief Narrative:  53M hx of Morbid obesity BMI >50, CHF, DM, hypothoroidism, came from home due to AKI. He lives alone and cannot take care of himself. He was noted to be lethargic in ED with hypercapnia and hypoxemia. Daughter she states she came to check on him and blood glucose was 47, he was unable to get out of chair. He was weak could not walk, desaturating 70s. He was encephalopathic at this time. Daughter lives next door.Was admitted to ICU initially. Patient had a significant weight gain over the last 2 weeks. He has anasarca. He was placed on CRRT. 09/10/19- patient is awake this am, he on levphed 25, hes on CRRT with dyasylate modifcation this am by renal team. S/p wound care eval, UOP has improved.  8/31- patient weaned off vasopressin, weaned levophed from 25>>10.  9/1- patient was evaluated by PALS, he does not wish to have permanent dialysis. Code status discussion in progress.  9/2- Plan to remove Left IJ HD catheter, trial of Lasix as per Nephrology 9/3- patient reports feeling well, he ate dinner states he feels well. S/p vascular eval for perm cath placement. 1 unit prbc transfustion ordered, heparin stopped due to borderline low h/h 9/4 medical therapy  9/8.Patient had a significant black stool, positive occult blood. Change Protonix to 40 mg IV twice a day, GI consult. He has been making large amount of urine, has not been dialyzed since 9/4.  9/9.EGD showed multiple gastric ulcers. No active bleeding. Hemoglobin dropped down to 7.0, transfuse 1 unit PRBC.  9/10.Hemoglobin stable, creatinine better at2.75.  9/12.Patient continued to have black stools, hemoglobin dropped down to 7.7 yesterday. Restart Protonix IV twice a day. Ask GI to see patient again.  9/13.  Goals of care discussion with  the family, see today's progress note.  9/14.  Discussed with patient son, obtain opinion from cardiology.  Also consulted palliative care again.  Due to his prognosis, hospice may be an option.   Assessment & Plan:   Active Problems:   Acute hypoxemic respiratory failure (HCC)   Acute renal failure (HCC)   Hyperkalemia   NSTEMI (non-ST elevated myocardial infarction) (HCC)   Pressure injury of skin   Pulmonary hypertension, unspecified (HCC)   Persistent atrial fibrillation (HCC)   Anemia   Acute right-sided CHF (congestive heart failure) ([place])   Acute blood loss anemia  #1.  Acute hypoxemic respiratory failure. Multifactorial secondary to severe pulmonary hypertension, right-sided congestive heart failure, morbid obesity with obesity hypoventilation syndrome, obstructive sleep apnea and aspiration pneumonia. Patient oxygenation is better.  But has high risk of deterioration based on multiple medical problems.  2.  Acute blood loss anemia secondary to upper GI bleed secondary to gastric ulcers. Patient is followed by GI.  He continued has some black stools.  Hemoglobin is stable, but slowly trending down.  He still Protonix IV twice a day. Continue follow, transfuse as needed.  3.  Acute kidney injury secondary to ATN on chronic kidney disease stage IIIb. Patient is followed by nephrology.  He required transient hemodialysis when he came to the hospital.  Creatinine still higher than normal.  He does not tolerate diuretics, which made renal function worse.  GI bleed may also played a role in higher creatinine level. No plan for additional dialysis from nephrology.  They will remove the temporary dialysis catheter  today.  4.  Acute right-sided congestive heart failure, severe pulmonary hypertension with anasarca. Patient still has significant dependent edema with anasarca.  Mainly at the thigh.  His renal function does not tolerate any diuretics. I have contacted cardiology Dr.End to  reevaluate patient, patient may be a hospice candidate.  I also consulted palliative care again.  5.  Hypotension. Patient had a transient hypotension at time of admission.  That has resolved.  6.  Obstructive sleep apnea. Continue CPAP while asleep.  7.  Sacral decubitus ulcer unstageable. Patient presented to the hospital with stage IV sacral pressure ulcer, this has progressed to unstageable.  Wound care seen the patient,  Changed air mattress, recommend frequent attending.  8.  Prognosis. Long-term prognosis very poor.  May be a hospice candidate.  9, hypokalemia. Supplement orally.    DVT prophylaxis: SCDS Code Status: DNR Family Communication: Updated patient and son.  .   Status is: Inpatient  Remains inpatient appropriate because:Inpatient level of care appropriate due to severity of illness   Dispo: The patient is from: Home              Anticipated d/c is to: SNF              Anticipated d/c date is: 2 days              Patient currently is not medically stable to d/c.        I/O last 3 completed shifts: In: 0  Out: 2025 [Urine:2025] Total I/O In: 120 [P.O.:120] Out: -      Consultants:   Nephrology, cardiology, palliative care  Procedures: None  Antimicrobials:None  Subjective: Patient still has short of breath with exertion, he is on 2 L oxygen. Cough, nonproductive. Good appetite without nausea vomiting. No fever chills.   Objective: Vitals:   09/24/19 0726 09/24/19 1528 09/25/19 0130 09/25/19 0750  BP: (!) 122/47 (!) 114/57  (!) 132/59  Pulse: 86 95 96 97  Resp: 18 19 19 19   Temp: 98.5 F (36.9 C) 97.8 F (36.6 C)  97.6 F (36.4 C)  TempSrc: Oral Oral  Oral  SpO2: 96% 93%  93%  Weight:      Height:        Intake/Output Summary (Last 24 hours) at 09/25/2019 1059 Last data filed at 09/25/2019 1006 Gross per 24 hour  Intake 120 ml  Output 1225 ml  Net -1105 ml   Filed Weights   09/18/19 0631 09/19/19 0637 09/20/19 1252    Weight: (!) 161.6 kg (!) 159.9 kg (!) 159.9 kg    Examination:  General exam: Appears calm and comfortable, morbid obese. Respiratory system: Decreased breathing sounds significantly. Respiratory effort normal. Cardiovascular system: S1 & S2 heard, RRR. No JVD, murmurs, rubs, gallops or clicks. 1+ pedal edema. Gastrointestinal system: Abdomen is nondistended, soft and nontender. No organomegaly or masses felt. Normal bowel sounds heard. Central nervous system: Alert and oriented x3.  No focal neurological deficits. Extremities: Symmetric  Skin: Sacral decubitus ulcer. Psychiatry: Mood & affect appropriate.     Data Reviewed: I have personally reviewed following labs and imaging studies  CBC: Recent Labs  Lab 09/21/19 0524 09/22/19 1047 09/23/19 0615 09/24/19 0309 09/25/19 0639  WBC 12.8* 13.5* 12.6* 13.4* 16.0*  NEUTROABS 10.7* 11.5* 10.6* 11.5* 14.3*  HGB 8.2* 7.7* 8.0* 7.7* 7.6*  HCT 26.5* 25.0* 25.2* 24.2* 25.3*  MCV 100.8* 100.4* 98.1 99.2 103.3*  PLT 105* 97* 103* 115* 242*   Basic Metabolic Panel:  Recent Labs  Lab 09/19/19 0537 09/19/19 0537 09/20/19 0725 09/20/19 0725 09/21/19 0524 09/22/19 1047 09/23/19 0615 09/24/19 0309 09/25/19 0639  NA  --   --  142   < > 145 142 142 144 144  K  --   --  3.4*   < > 3.8 3.6 3.3* 3.3* 3.4*  CL  --   --  104   < > 108 102 103 104 102  CO2  --   --  30   < > 31 29 30 31 31   GLUCOSE  --   --  132*   < > 168* 167* 154* 140* 157*  BUN  --   --  88*   < > 80* 78* 85* 82* 81*  CREATININE 3.08*   < > 3.07*   < > 2.75* 3.12* 3.25* 3.15* 3.23*  CALCIUM  --   --  8.4*   < > 8.3* 8.2* 8.6* 8.3* 8.4*  MG 2.2   < > 2.3   < > 2.3 2.1 1.9 2.0 2.1  PHOS 4.2  --  4.0  --  3.9 4.3 4.1  --   --    < > = values in this interval not displayed.   GFR: Estimated Creatinine Clearance: 27.5 mL/min (A) (by C-G formula based on SCr of 3.23 mg/dL (H)). Liver Function Tests: No results for input(s): AST, ALT, ALKPHOS, BILITOT, PROT, ALBUMIN  in the last 168 hours. No results for input(s): LIPASE, AMYLASE in the last 168 hours. No results for input(s): AMMONIA in the last 168 hours. Coagulation Profile: No results for input(s): INR, PROTIME in the last 168 hours. Cardiac Enzymes: No results for input(s): CKTOTAL, CKMB, CKMBINDEX, TROPONINI in the last 168 hours. BNP (last 3 results) No results for input(s): PROBNP in the last 8760 hours. HbA1C: No results for input(s): HGBA1C in the last 72 hours. CBG: Recent Labs  Lab 09/24/19 0727 09/24/19 1149 09/24/19 1648 09/24/19 2158 09/25/19 0752  GLUCAP 129* 143* 202* 163* 161*   Lipid Profile: No results for input(s): CHOL, HDL, LDLCALC, TRIG, CHOLHDL, LDLDIRECT in the last 72 hours. Thyroid Function Tests: No results for input(s): TSH, T4TOTAL, FREET4, T3FREE, THYROIDAB in the last 72 hours. Anemia Panel: No results for input(s): VITAMINB12, FOLATE, FERRITIN, TIBC, IRON, RETICCTPCT in the last 72 hours. Sepsis Labs: No results for input(s): PROCALCITON, LATICACIDVEN in the last 168 hours.  No results found for this or any previous visit (from the past 240 hour(s)).       Radiology Studies: No results found.      Scheduled Meds: . (feeding supplement) PROSource Plus  30 mL Oral TID WC  . sodium chloride   Intravenous Once  . Chlorhexidine Gluconate Cloth  6 each Topical Daily  . citalopram  10 mg Oral Daily  . collagenase   Topical Daily  . epoetin (EPOGEN/PROCRIT) injection  20,000 Units Subcutaneous Weekly  . feeding supplement (NEPRO CARB STEADY)  237 mL Oral TID BM  . fludrocortisone  0.2 mg Oral BID  . insulin aspart  0-5 Units Subcutaneous QHS  . insulin aspart  0-9 Units Subcutaneous TID WC  . levothyroxine  175 mcg Oral Daily  . mouth rinse  15 mL Mouth Rinse BID  . midodrine  10 mg Oral TID WC  . multivitamin with minerals  1 tablet Oral Daily  . nystatin cream   Topical BID  . pantoprazole (PROTONIX) IV  40 mg Intravenous Q12H  . potassium  chloride  40 mEq Oral Once  . senna  1 tablet Oral BID  . zinc oxide  1 application Topical Daily   Continuous Infusions:   LOS: 17 days    Time spent: 35 minutes    Sharen Hones, MD [facility]   To contact the attending provider between 7A-7P or the covering provider during after hours 7P-7A, please log into the web site [email or web address] and access using universal [place] password for that web site. If you do not have the password, please call the hospital operator.  09/25/2019, 10:59 AM

## 2019-09-25 NOTE — Progress Notes (Signed)
[facility]  ROUNDING NOTE   Subjective:   09/13 0701 - 09/14 0700 In: 0  Out: 6269 [Urine:1225]   Patient resting in bed, son at bedside. He denies worsening SOB, reports 'feeling full', unable to eat much.    Objective:  Vital signs in last 24 hours:  Temp:  [97.6 F (36.4 C)-97.8 F (36.6 C)] 97.6 F (36.4 C) (09/14 0750) Pulse Rate:  [95-97] 97 (09/14 0750) Resp:  [19] 19 (09/14 0750) BP: (114-132)/(57-59) 132/59 (09/14 0750) SpO2:  [93 %] 93 % (09/14 0750)  Weight change:  Filed Weights   09/18/19 0631 09/19/19 0637 09/20/19 1252  Weight: (!) 161.6 kg (!) 159.9 kg (!) 159.9 kg    Intake/Output: I/O last 3 completed shifts: In: 0  Out: 2025 [Urine:2025]   Intake/Output this shift:  No intake/output data recorded.  Physical Exam: General: Awake,alert, in no acute distress  Head: Oral  mucous membrane moist  Lungs:  Diminished at the bases  Heart: S1S2,irregular rhythm  Abdomen:  Non tender,non distended  Extremities: 1+ bilateral lower extremity edema  Neurologic: Oriented X 3  Skin: No acute lesions  Access: LIJ temp HD catheter    Basic Metabolic Panel: Recent Labs  Lab 09/19/19 0537 09/19/19 0537 09/20/19 0725 09/20/19 0725 09/21/19 0524 09/21/19 0524 09/22/19 1047 09/22/19 1047 09/23/19 0615 09/24/19 0309 09/25/19 0639  NA  --   --  142   < > 145  --  142  --  142 144 144  K  --   --  3.4*   < > 3.8  --  3.6  --  3.3* 3.3* 3.4*  CL  --   --  104   < > 108  --  102  --  103 104 102  CO2  --   --  30   < > 31  --  29  --  30 31 31   GLUCOSE  --   --  132*   < > 168*  --  167*  --  154* 140* 157*  BUN  --   --  88*   < > 80*  --  78*  --  85* 82* 81*  CREATININE 3.08*   < > 3.07*   < > 2.75*  --  3.12*  --  3.25* 3.15* 3.23*  CALCIUM  --   --  8.4*   < > 8.3*   < > 8.2*   < > 8.6* 8.3* 8.4*  MG 2.2   < > 2.3   < > 2.3  --  2.1  --  1.9 2.0 2.1  PHOS 4.2  --  4.0  --  3.9  --  4.3  --  4.1  --   --    < > = values in this  interval not displayed.    Liver Function Tests: No results for input(s): AST, ALT, ALKPHOS, BILITOT, PROT, ALBUMIN in the last 168 hours. No results for input(s): LIPASE, AMYLASE in the last 168 hours. No results for input(s): AMMONIA in the last 168 hours.  CBC: Recent Labs  Lab 09/21/19 0524 09/22/19 1047 09/23/19 0615 09/24/19 0309 09/25/19 0639  WBC 12.8* 13.5* 12.6* 13.4* 16.0*  NEUTROABS 10.7* 11.5* 10.6* 11.5* 14.3*  HGB 8.2* 7.7* 8.0* 7.7* 7.6*  HCT 26.5* 25.0* 25.2* 24.2* 25.3*  MCV 100.8* 100.4* 98.1 99.2 103.3*  PLT 105* 97* 103* 115* 120*    Cardiac Enzymes: No results for input(s): CKTOTAL, CKMB, CKMBINDEX, TROPONINI in  the last 168 hours.  BNP: Invalid input(s): POCBNP  CBG: Recent Labs  Lab 09/24/19 0727 09/24/19 1149 09/24/19 1648 09/24/19 2158 09/25/19 0752  GLUCAP 129* 143* 202* 163* 161*    Microbiology: Results for orders placed or performed during the hospital encounter of 09/08/19  MRSA PCR Screening     Status: None   Collection Time: 09/08/19 11:49 AM   Specimen: Nasopharyngeal  Result Value Ref Range Status   MRSA by PCR NEGATIVE NEGATIVE Final    Comment:        The GeneXpert MRSA Assay (FDA approved for NASAL specimens only), is one component of a comprehensive MRSA colonization surveillance program. It is not intended to diagnose MRSA infection nor to guide or monitor treatment for MRSA infections. Performed at [facility], [address] [place], [place] [address]   SARS Coronavirus 2 by RT PCR (hospital order, performed in [facility] hospital lab) Nasopharyngeal Nasopharyngeal Swab     Status: None   Collection Time: 09/08/19 12:05 PM   Specimen: Nasopharyngeal Swab  Result Value Ref Range Status   SARS Coronavirus 2 NEGATIVE NEGATIVE Final    Comment: (NOTE) SARS-CoV-2 target nucleic acids are NOT DETECTED.  The SARS-CoV-2 RNA is generally detectable in upper and lower respiratory specimens during the  acute phase of infection. The lowest concentration of SARS-CoV-2 viral copies this assay can detect is 250 copies / mL. A negative result does not preclude SARS-CoV-2 infection and should not be used as the sole basis for treatment or other patient management decisions.  A negative result may occur with improper specimen collection / handling, submission of specimen other than nasopharyngeal swab, presence of viral mutation(s) within the areas targeted by this assay, and inadequate number of viral copies (<250 copies / mL). A negative result must be combined with clinical observations, patient history, and epidemiological information.  Fact Sheet for Patients:   StrictlyIdeas.no  Fact Sheet for Healthcare Providers: BankingDealers.co.za  This test is not yet approved or  cleared by the [place] FDA and has been authorized for detection and/or diagnosis of SARS-CoV-2 by FDA under an Emergency Use Authorization (EUA).  This EUA will remain in effect (meaning this test can be used) for the duration of the COVID-19 declaration under Section 564(b)(1) of the Act, 21 U.S.C. section 360bbb-3(b)(1), unless the authorization is terminated or revoked sooner.  Performed at [facility], [address] [place], [place] [address]   Blood culture (routine x 2)     Status: Abnormal   Collection Time: 09/08/19 12:07 PM   Specimen: BLOOD  Result Value Ref Range Status   Specimen Description   Final    BLOOD L DISTAL FOREARM Performed at [facility], [address], [place], [place] [address]    Special Requests   Final    BOTTLES DRAWN AEROBIC AND ANAEROBIC Blood Culture results may not be optimal due to an excessive volume of blood received in culture bottles Performed at [facility], [address] [place], [place] [address]    Culture  Setup Time   Final    GRAM POSITIVE COCCI AEROBIC BOTTLE  ONLY CRITICAL RESULT CALLED TO, READ BACK BY AND VERIFIED WITH: MORGAN CUNNINGHAM  AT 6578 ON 09/09/19 SNG    Culture (A)  Final    STAPHYLOCOCCUS EPIDERMIDIS THE SIGNIFICANCE OF ISOLATING THIS ORGANISM FROM A SINGLE SET OF BLOOD CULTURES WHEN MULTIPLE SETS ARE DRAWN IS UNCERTAIN. PLEASE NOTIFY THE MICROBIOLOGY DEPARTMENT WITHIN ONE WEEK IF  SPECIATION AND SENSITIVITIES ARE REQUIRED. Performed at [facility], [place] [address], [place], [place] [address]    Report Status 09/11/2019 FINAL  Final  Blood Culture ID Panel (Reflexed)     Status: Abnormal   Collection Time: 09/08/19 12:07 PM  Result Value Ref Range Status   Enterococcus faecalis NOT DETECTED NOT DETECTED Final   Enterococcus Faecium NOT DETECTED NOT DETECTED Final   Listeria monocytogenes NOT DETECTED NOT DETECTED Final   Staphylococcus species DETECTED (A) NOT DETECTED Final    Comment: CRITICAL RESULT CALLED TO, READ BACK BY AND VERIFIED WITH: MORGAN CUNNINGHAM AT 1345 ON 09/09/19 SNG    Staphylococcus aureus (BCID) NOT DETECTED NOT DETECTED Final   Staphylococcus epidermidis DETECTED (A) NOT DETECTED Final    Comment: CRITICAL RESULT CALLED TO, READ BACK BY AND VERIFIED WITH: MORGAN CUNNINGHAM AT 1345 ON 09/09/19 SNG    Staphylococcus lugdunensis NOT DETECTED NOT DETECTED Final   Streptococcus species NOT DETECTED NOT DETECTED Final   Streptococcus agalactiae NOT DETECTED NOT DETECTED Final   Streptococcus pneumoniae NOT DETECTED NOT DETECTED Final   Streptococcus pyogenes NOT DETECTED NOT DETECTED Final   A.calcoaceticus-baumannii NOT DETECTED NOT DETECTED Final   Bacteroides fragilis NOT DETECTED NOT DETECTED Final   Enterobacterales NOT DETECTED NOT DETECTED Final   Enterobacter cloacae complex NOT DETECTED NOT DETECTED Final   Escherichia coli NOT DETECTED NOT DETECTED Final   Klebsiella aerogenes NOT DETECTED NOT DETECTED Final   Klebsiella oxytoca NOT DETECTED NOT DETECTED Final   Klebsiella pneumoniae NOT  DETECTED NOT DETECTED Final   Proteus species NOT DETECTED NOT DETECTED Final   Salmonella species NOT DETECTED NOT DETECTED Final   Serratia marcescens NOT DETECTED NOT DETECTED Final   Haemophilus influenzae NOT DETECTED NOT DETECTED Final   Neisseria meningitidis NOT DETECTED NOT DETECTED Final   Pseudomonas aeruginosa NOT DETECTED NOT DETECTED Final   Stenotrophomonas maltophilia NOT DETECTED NOT DETECTED Final   Candida albicans NOT DETECTED NOT DETECTED Final   Candida auris NOT DETECTED NOT DETECTED Final   Candida glabrata NOT DETECTED NOT DETECTED Final   Candida krusei NOT DETECTED NOT DETECTED Final   Candida parapsilosis NOT DETECTED NOT DETECTED Final   Candida tropicalis NOT DETECTED NOT DETECTED Final   Cryptococcus neoformans/gattii NOT DETECTED NOT DETECTED Final   Methicillin resistance mecA/C NOT DETECTED NOT DETECTED Final    Comment: Performed at [facility], [address] [place], [place] [address]  Blood culture (routine x 2)     Status: None   Collection Time: 09/08/19 12:59 PM   Specimen: BLOOD  Result Value Ref Range Status   Specimen Description BLOOD LEFT UPPER FORARM  Final   Special Requests   Final    BOTTLES DRAWN AEROBIC AND ANAEROBIC Blood Culture results may not be optimal due to an excessive volume of blood received in culture bottles   Culture   Final    NO GROWTH 5 DAYS Performed at [facility], [address] [place], [place] [address]    Report Status 09/13/2019 FINAL  Final    Coagulation Studies: No results for input(s): LABPROT, INR in the last 72 hours.  Urinalysis: No results for input(s): COLORURINE, LABSPEC, PHURINE, GLUCOSEU, HGBUR, BILIRUBINUR, KETONESUR, PROTEINUR, UROBILINOGEN, NITRITE, LEUKOCYTESUR in the last 72 hours.  Invalid input(s): APPERANCEUR    Imaging: No results found.   Medications:    . (feeding supplement) PROSource Plus  30 mL Oral TID WC  . sodium chloride   Intravenous Once   .  Chlorhexidine Gluconate Cloth  6 each Topical Daily  . citalopram  10 mg Oral Daily  . collagenase   Topical Daily  . epoetin (EPOGEN/PROCRIT) injection  20,000 Units Subcutaneous Weekly  . feeding supplement (NEPRO CARB STEADY)  237 mL Oral TID BM  . fludrocortisone  0.2 mg Oral BID  . insulin aspart  0-5 Units Subcutaneous QHS  . insulin aspart  0-9 Units Subcutaneous TID WC  . levothyroxine  175 mcg Oral Daily  . mouth rinse  15 mL Mouth Rinse BID  . midodrine  10 mg Oral TID WC  . multivitamin with minerals  1 tablet Oral Daily  . nystatin cream   Topical BID  . pantoprazole (PROTONIX) IV  40 mg Intravenous Q12H  . potassium chloride  40 mEq Oral Once  . senna  1 tablet Oral BID  . zinc oxide  1 application Topical Daily   acetaminophen **OR** acetaminophen, heparin sodium (porcine), ipratropium-albuterol, ondansetron **OR** ondansetron (ZOFRAN) IV, ondansetron (ZOFRAN) IV, polyethylene glycol  Assessment/ Plan:  Mr. Donya Tomaro is a 79 y.o. 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, hypoventilation syndrome, hyperlipidemia who is admitted to [facility] on 09/08/2019 for Acidosis [E87.2] Hyperkalemia [E87.5] ATN (acute tubular necrosis) (HCC) [N17.0] Acute renal failure with tubular necrosis (HCC) [N17.0] Acute renal failure (HCC) [N17.9] Hypoxia [R09.02] NSTEMI (non-ST elevated myocardial infarction) ([place]) [I21.4] AKI (acute kidney injury) ([place]) [N17.9] Acute hypoxemic respiratory failure (HCC) [J96.01] Hypotension due to hypovolemia [I95.89, E86.1]  # Acute renal failure And anasarca Chronic kidney disease stage IIIB.  Baseline Creatinine 1.5  Nonoliguric urine output. 09/13 0701 - 09/14 0700 In: 0  Out: 1225 [Urine:1225]   -Urine output adequate -No acute indication for dialysis -Left IJ catheter can be removed  # Hypotension -Patient's BP readings within acceptable range -Will  continue monitoring and treat accordingly  #Bacterial pneumonia Chest Xray on 09/17/19  IMPRESSION: Stable bilateral pulmonary edema. Treated for Pneumonia during this admission  #Anemia of chronic disease Lab Results  Component Value Date   HGB 7.6 (L) 09/25/2019   No active GI bleeding reported at this time,GI team following up Will continue monitoring CBC      LOS: 17 Andrew Valentine 9/14/202110:03 AM

## 2019-09-25 NOTE — Plan of Care (Signed)
  Problem: Clinical Measurements: Goal: Ability to maintain clinical measurements within normal limits will improve Outcome: Progressing Goal: Will remain free from infection Outcome: Progressing Goal: Diagnostic test results will improve Outcome: Progressing Goal: Respiratory complications will improve Outcome: Progressing Goal: Cardiovascular complication will be avoided Outcome: Progressing   Problem: Activity: Goal: Risk for activity intolerance will decrease Outcome: Progressing   Problem: Nutrition: Goal: Adequate nutrition will be maintained Outcome: Progressing   Problem: Elimination: Goal: Will not experience complications related to urinary retention Outcome: Progressing   Problem: Pain Managment: Goal: General experience of comfort will improve Outcome: Progressing   Problem: Skin Integrity: Goal: Risk for impaired skin integrity will decrease Outcome: Progressing

## 2019-09-25 NOTE — Progress Notes (Signed)
Nutrition Follow-up  DOCUMENTATION CODES:   Morbid obesity  INTERVENTION:  Continue Nepro Shake po TID between meals, each supplement provides 425 kcal and 19 grams protein.  Continue PROSource Plus po TID with meals, each supplement provides 100 kcal and 51 grams of protein.  Continue MVI po daily.  NUTRITION DIAGNOSIS:   Inadequate oral intake related to decreased appetite as evidenced by per patient/family report.  Ongoing.   GOAL:   Patient will meet greater than or equal to 90% of their needs  Progressing.  MONITOR:   PO intake, Supplement acceptance, Labs, Weight trends, Skin, I & O's  REASON FOR ASSESSMENT:   LOS    ASSESSMENT:   79 year old male with PMHx of hypothyroidism, OHS, HTN, CHF, DM admitted with acute hypoxic respiratory failure, AKI on CKD stage 3, persistent A-fib.   8/29 CRRT initiated 8/30 placed on dysphagia 1 diet with thin liquids 9/2 CRRT stopped 9/4 intermittent HD 9/6 upgraded to dysphagia 3 with thin liquids  Per discussion with Palliative Medicine patient has stated he would never want a feeding tube.  Met with patient at bedside. He reports his appetite remains decreased overall but comes and goes. He was able to eat 100% of lunch yesterday and breakfast today but then ate 0% of his lunch. Average intake of the last 8 meals documented is 44% but no meals were documented between 9/8 to 9/13.Marland Kitchen He reports he is drinking 2-3 bottles of Nepro daily. Some days he is able to drink 3 servings of PROSource but he reports yesterday he could not tolerate any at all. He did have one this AM per his report.  Medications reviewed and include: Epogen 2000 units weekly, Novolog 0-9 units TID, Novolog 0-5 units QHS, levothyroxine, MVI daily, Protonix, senna 1 tablet BID.  Labs reviewed: CBG 161-202, Potassium 3.4, BUN 81, Creatinine 3.23.  I/O: 1225 ml UOP yesterday (0.3 mL/kg/hr)  No wt to trend since 9/9.  Diet Order:   Diet Order             Diet renal/carb modified with fluid restriction Diet-HS Snack? Nothing; Fluid restriction: 1200 mL Fluid; Room service appropriate? Yes; Fluid consistency: Thin  Diet effective now                EDUCATION NEEDS:   No education needs have been identified at this time  Skin:  Skin Assessment: Skin Integrity Issues: Skin Integrity Issues:: Unstageable Stage II: N/A Unstageable: sacrum (3cm x 3cm) Other: Skin tear right posterior lower arm (3cm x 1cm); skin tear right proximal posterior arm (2cm x 2cm); skin tear left foot (5cm x 5cm)  Last BM:  09/24/2019 - medium type 6  Height:   Ht Readings from Last 1 Encounters:  09/20/19 _0  (1.727 m)   Weight:   Wt Readings from Last 1 Encounters:  09/20/19 (!) 159.9 kg   BMI:  Body mass index is 53.6 kg/m.  Estimated Nutritional Needs:   Kcal:  2800-3000  Protein:  150-160 grams  Fluid:  per MD  Jacklynn Barnacle, MS, RD, LDN Pager number available on Amion

## 2019-09-25 NOTE — Progress Notes (Signed)
Ch visited with Pt and Pt's son as per recommendation by Judd Lien to complete and notarize AD. Ch came back with notary and witnesses (volunteers). After notarization the requested copies were made and given back to Pt. A copy placed on Pt's physical file, and a copy scanned into Pt's electronic system file.

## 2019-09-25 NOTE — Progress Notes (Signed)
Daily Progress Note   Patient Name: Andrew Valentine       Date: 09/25/2019 DOB: [birth year]  Age: 79 y.o. MRN#: [id number] Attending Physician: Sharen Hones, MD Primary Care Physician: [facility] [place] Date: 09/08/2019  Reason for Consultation/Follow-up: Establishing goals of care  Subjective: Patient is resting in bed. He cannot recall all of his diagnoses or all that he has discussed with his medical team, but discusses his rectal bleeding. He states he has talked with his daughter and would like to go home, but understands he cannot. He states he has discussed hospice with them. He states he believes he is ready for this but wants his children to be part of the conversation. Spoke with his children. They state they are still of the understanding he will be trying SNF and then transitioning to hospice if things do not go well. They state they want to talk with cardiology, as this conversation will determine if they want hospice, and prefer the son to speak with cardiology as he is a Marine scientist.   Continue to recommend palliative at SNF and transition to hospice if patient and family chooses. I will f/u Thursday at daughter's request.        Length of Stay: 17  Current Medications: Scheduled Meds:  . (feeding supplement) PROSource Plus  30 mL Oral TID WC  . sodium chloride   Intravenous Once  . Chlorhexidine Gluconate Cloth  6 each Topical Daily  . citalopram  10 mg Oral Daily  . collagenase   Topical Daily  . epoetin (EPOGEN/PROCRIT) injection  20,000 Units Subcutaneous Weekly  . feeding supplement (NEPRO CARB STEADY)  237 mL Oral TID BM  . fludrocortisone  0.2 mg Oral BID  . insulin aspart  0-5 Units Subcutaneous QHS  . insulin aspart  0-9 Units Subcutaneous TID WC  .  levothyroxine  175 mcg Oral Daily  . mouth rinse  15 mL Mouth Rinse BID  . midodrine  10 mg Oral TID WC  . multivitamin with minerals  1 tablet Oral Daily  . nystatin cream   Topical BID  . pantoprazole (PROTONIX) IV  40 mg Intravenous Q12H  . senna  1 tablet Oral BID  . zinc oxide  1 application Topical Daily    Continuous Infusions:   PRN Meds: acetaminophen **OR**  acetaminophen, heparin sodium (porcine), ipratropium-albuterol, ondansetron **OR** ondansetron (ZOFRAN) IV, ondansetron (ZOFRAN) IV, polyethylene glycol  Physical Exam Constitutional:      Comments: Confused  Pulmonary:     Effort: Pulmonary effort is normal.  Neurological:     Mental Status: He is alert.             Vital Signs: BP (!) 132/59 (BP Location: Right Arm)   Pulse 97   Temp 97.6 F (36.4 C) (Oral)   Resp 19   Ht 5\' 8"  (1.727 m)   Wt (!) 159.9 kg   SpO2 93% Comment: Rechecked oxygen after the submit of other reading  BMI 53.60 kg/m  SpO2: SpO2: 93 % (Rechecked oxygen after the submit of other reading) O2 Device: O2 Device: Nasal Cannula O2 Flow Rate: O2 Flow Rate (L/min): 3 L/min  Intake/output summary:   Intake/Output Summary (Last 24 hours) at 09/25/2019 1548 Last data filed at 09/25/2019 1404 Gross per 24 hour  Intake 120 ml  Output 650 ml  Net -530 ml   LBM: Last BM Date: 09/23/19 Baseline Weight: Weight: (!) 149.7 kg Most recent weight: Weight: (!) 159.9 kg       Palliative Assessment/Data:    Flowsheet Rows     Most Recent Value  Intake Tab  Referral Department Critical care  Unit at Time of Referral ICU  Date Notified 09/11/19  Palliative Care Type New Palliative care  Reason for referral Clarify Goals of Care  Date of Admission 09/08/19  # of days IP prior to Palliative referral 3  Clinical Assessment  Psychosocial & Spiritual Assessment  Palliative Care Outcomes      Patient Active Problem List   Diagnosis Date Noted  . Acute right-sided CHF (congestive heart  failure) ([place]) 09/19/2019  . Acute blood loss anemia 09/19/2019  . Persistent atrial fibrillation ([place]) 09/17/2019  . Anemia 09/17/2019  . Pulmonary hypertension, unspecified ([place])   . Pressure injury of skin 09/09/2019  . Hyperkalemia   . NSTEMI (non-ST elevated myocardial infarction) ([place])   . Acute hypoxemic respiratory failure ([place]) 09/08/2019  . Acute renal failure ([place]) 09/08/2019  . Acute renal failure with tubular necrosis ([place])   . Hypoxia   . Hypotension due to hypovolemia   . Atrial fibrillation [facility])     Palliative Care Assessment & Plan   Recommendations/Plan:  DNR/DNI. No feeding tube.   Continue to treat the treatable.   Recommend contacting family in addition to speaking with patient.  Recommend palliative at D/C. Transition to hospice when family is ready.    Code Status:    Code Status Orders  (From admission, onward)         Start     Ordered   09/13/19 1312  Limited resuscitation (code)  Continuous       Question Answer Comment  In the event of cardiac or respiratory ARREST: Initiate Code Blue, Call Rapid Response Yes   In the event of cardiac or respiratory ARREST: Perform CPR Yes   In the event of cardiac or respiratory ARREST: Perform Intubation/Mechanical Ventilation No   In the event of cardiac or respiratory ARREST: Use NIPPV/BiPAp only if indicated Yes   In the event of cardiac or respiratory ARREST: Administer ACLS medications if indicated Yes   In the event of cardiac or respiratory ARREST: Perform Defibrillation or Cardioversion if indicated Yes   Comments MOST form on chart.      09/13/19 1312        Code Status  History    Date Active Date Inactive Code Status Order ID Comments User Context   09/12/2019 1623 09/13/2019 1312 DNR [id number]  Asencion Gowda, NP Inpatient   09/08/2019 1850 09/12/2019 1623 Full Code [id number]  Fritzi Mandes, MD ED   09/08/2019 1845 09/08/2019 1850 Full Code [id number]  Fritzi Mandes, MD ED   09/08/2019 1551  09/08/2019 1845 DNR [id number]  Fritzi Mandes, MD ED   Advance Care Planning Activity       Prognosis:   Poor overall   Thank you for allowing the Palliative Medicine Team to assist in the care of this patient.   Total Time 50 min Prolonged Time Billed  no      Greater than 50%  of this time was spent counseling and coordinating care related to the above assessment and plan.  Asencion Gowda, NP  Please contact Palliative Medicine Team phone at [phone] for questions and concerns.

## 2019-09-26 LAB — GLUCOSE, CAPILLARY
Glucose-Capillary: 147 mg/dL — ABNORMAL HIGH (ref 70–99)
Glucose-Capillary: 194 mg/dL — ABNORMAL HIGH (ref 70–99)
Glucose-Capillary: 185 mg/dL — ABNORMAL HIGH (ref 70–99)
Glucose-Capillary: 202 mg/dL — ABNORMAL HIGH (ref 70–99)

## 2019-09-26 LAB — CBC WITH DIFFERENTIAL/PLATELET
Abs Immature Granulocytes: 0.13 10*3/uL — ABNORMAL HIGH (ref 0.00–0.07)
Basophils Absolute: 0 10*3/uL (ref 0.0–0.1)
Basophils Relative: 0 %
Eosinophils Absolute: 0.2 10*3/uL (ref 0.0–0.5)
Eosinophils Relative: 1 %
HCT: 25.5 % — ABNORMAL LOW (ref 39.0–52.0)
Hemoglobin: 7.9 g/dL — ABNORMAL LOW (ref 13.0–17.0)
Immature Granulocytes: 1 %
Lymphocytes Relative: 4 %
Lymphs Abs: 0.6 10*3/uL — ABNORMAL LOW (ref 0.7–4.0)
MCH: 31.9 pg (ref 26.0–34.0)
MCHC: 31 g/dL (ref 30.0–36.0)
MCV: 102.8 fL — ABNORMAL HIGH (ref 80.0–100.0)
Monocytes Absolute: 1 10*3/uL (ref 0.1–1.0)
Monocytes Relative: 6 %
Neutro Abs: 15.9 10*3/uL — ABNORMAL HIGH (ref 1.7–7.7)
Neutrophils Relative %: 88 %
Platelets: 131 10*3/uL — ABNORMAL LOW (ref 150–400)
RBC: 2.48 MIL/uL — ABNORMAL LOW (ref 4.22–5.81)
RDW: 18.7 % — ABNORMAL HIGH (ref 11.5–15.5)
WBC: 17.9 10*3/uL — ABNORMAL HIGH (ref 4.0–10.5)
nRBC: 0 % (ref 0.0–0.2)

## 2019-09-26 LAB — MAGNESIUM: Magnesium: 2 mg/dL (ref 1.7–2.4)

## 2019-09-26 LAB — BASIC METABOLIC PANEL
Anion gap: 11 (ref 5–15)
BUN: 84 mg/dL — ABNORMAL HIGH (ref 8–23)
CO2: 31 mmol/L (ref 22–32)
Calcium: 8.6 mg/dL — ABNORMAL LOW (ref 8.9–10.3)
Chloride: 102 mmol/L (ref 98–111)
Creatinine, Ser: 3.36 mg/dL — ABNORMAL HIGH (ref 0.61–1.24)
GFR calc Af Amer: 19 mL/min — ABNORMAL LOW (ref >60)
GFR calc non Af Amer: 16 mL/min — ABNORMAL LOW (ref >60)
Glucose, Bld: 167 mg/dL — ABNORMAL HIGH (ref 70–99)
Potassium: 3.9 mmol/L (ref 3.5–5.1)
Sodium: 144 mmol/L (ref 135–145)

## 2019-09-26 NOTE — Progress Notes (Signed)
Physical Therapy Treatment Patient Details Name: Andrew Valentine MRN: [id number] DOB: [birth year] Today's Date: 09/26/2019    History of Present Illness Pt is a 79 year old male with a known history of morbid obesity, sleep apnea intolerant noncompliant to CPAP, hypertension, diabetes, hyperlipidemia, hypothyroidism, remote history of tobacco abuse comes to the emergency room with altered mental status and hypoxia.  Pt was on CRRT starting 09/09/19 now off CRRT with plan for PermCath placement 09/14/19.  MD assessment includes: Acute on chronic kidney failure, acute hypoxic respiratory failure, pulmonary HTN, anemia, and hypotension.    PT Comments    Pt was pleasant but had little motivation to participate in the session. Pt was very lethargic and displayed a flat affect. Pt needed encouragement to complete bed exercises. Pt displayed profound weakness and needed significant PT help to complete bed exercises. Pt needed consistent stimulus to remain awake during session. Pt declined further functional mobility like sitting EOB. Pt will benefit from PT services in a SNF setting upon discharge to safely address deficits listed in patient problem list for decreased caregiver assistance and eventual return to PLOF.   Follow Up Recommendations  SNF     Equipment Recommendations  Other (comment);3in1 (PT) (defer to next level of care)    Recommendations for Other Services       Precautions / Restrictions Precautions Precautions: Fall Restrictions Weight Bearing Restrictions: No    Mobility  Bed Mobility               General bed mobility comments: Not attempted due to pt tolerence  Transfers                 General transfer comment: not attempted due to tolerence  Ambulation/Gait             General Gait Details: unsafe to progress at this time    Stairs             Wheelchair Mobility    Modified Rankin (Stroke Patients Only)       Balance Overall balance  assessment: Needs assistance     Sitting balance - Comments: pt not mobilized due to tolerence                                    Cognition Arousal/Alertness: Lethargic Behavior During Therapy: WFL for tasks assessed/performed;Flat affect Overall Cognitive Status: Within Functional Limits for tasks assessed                                        Exercises Total Joint Exercises Ankle Circles/Pumps: AROM;Strengthening;Both;10 reps;15 reps Quad Sets: Strengthening;Both;10 reps Gluteal Sets: Strengthening;Both;10 reps Heel Slides: AAROM;Strengthening;Both;10 reps Hip ABduction/ADduction: AAROM;Both;10 reps;Strengthening Other Exercises Other Exercises: pt reviwed on supine HEP. Pt demonstrates understanding but little motivation to participate    General Comments        Pertinent Vitals/Pain Pain Assessment: No/denies pain    Home Living                      Prior Function            PT Goals (current goals can now be found in the care plan section) Acute Rehab PT Goals Patient Stated Goal: To return to PLOF PT Goal Formulation: With patient Time For Goal Achievement: 09/26/19 Potential  to Achieve Goals: Poor Progress towards PT goals: Not progressing toward goals - comment (pt limited by profound functional weakness)    Frequency    Min 2X/week      PT Plan Current plan remains appropriate    Co-evaluation              AM-PAC PT "6 Clicks" Mobility   Outcome Measure  Help needed turning from your back to your side while in a flat bed without using bedrails?: A Lot Help needed moving from lying on your back to sitting on the side of a flat bed without using bedrails?: Total Help needed moving to and from a bed to a chair (including a wheelchair)?: Total Help needed standing up from a chair using your arms (e.g., wheelchair or bedside chair)?: Total Help needed to walk in hospital room?: Total Help needed  climbing 3-5 steps with a railing? : Total 6 Click Score: 7    End of Session Equipment Utilized During Treatment: Oxygen Activity Tolerance: Patient tolerated treatment well;Patient limited by fatigue Patient left: in bed;with call bell/phone within reach;with bed alarm set;with family/visitor present Nurse Communication: Mobility status PT Visit Diagnosis: History of falling (Z91.81);Difficulty in walking, not elsewhere classified (R26.2);Muscle weakness (generalized) (M62.81)     Time: 5913-6859 PT Time Calculation (min) (ACUTE ONLY): 23 min  Charges:                        Hervey Ard, SPT 09/26/19. 3:17 PM

## 2019-09-26 NOTE — Progress Notes (Signed)
PROGRESS NOTE    Andrew Valentine  UDJ:[id number] DOB: [birth year] DOA: 09/08/2019 PCP: [facility]    Brief Narrative:  [phone] 100-1500 hx of Morbid obesity BMI >50, CHF, DM, hypothoroidism, came from home due to AKI. He lives alone and cannot take care of himself. He was noted to be lethargic in ED with hypercapnia and hypoxemia. Daughter she states she came to check on him and blood glucose was 47, he was unable to get out of chair. He was weak could not walk, desaturating 70s. He was encephalopathic at this time. Daughter lives next door.Was admitted to ICU initially. Patient had a significant weight gain over the last 2 weeks. He has anasarca. He was placed on CRRT. 09/10/19- patient is awake this am, he on levphed 25, hes on CRRT with dyasylate modifcation this am by renal team. S/p wound care eval, UOP has improved.  8/31- patient weaned off vasopressin, weaned levophed from 25>>10.  9/1- patient was evaluated by PALS, he does not wish to have permanent dialysis. Code status discussion in progress.  9/2- Plan to remove Left IJ HD catheter, trial of Lasix as per Nephrology 9/3- patient reports feeling well, he ate dinner states he feels well. S/p vascular eval for perm cath placement. 1 unit prbc transfustion ordered, heparin stopped due to borderline low h/h 9/4 medical therapy  9/8.Patient had a significant black stool, positive occult blood. Change Protonix to 40 mg IV twice a day, GI consult. He has been making large amount of urine, has not been dialyzed since 9/4.  9/9.EGD showed multiple gastric ulcers. No active bleeding. Hemoglobin dropped down to 7.0, transfuse 1 unit PRBC.  9/10.Hemoglobin stable, creatinine better at2.75.  9/12.Patient continued to have black stools, hemoglobin dropped down to 7.7 yesterday. Restart Protonix IV twice a day. Ask GI to see patient again.  9/13.  Goals of care discussion with the family, see today's progress  note.  9/15: spoke to family and daughter about goals of care. Daughter wants to discuss with cards about SNF    Consultants:   Cards, renal  Procedures: HD  Antimicrobials:       Subjective: Has no new complaints. Mildly sob lying in bed but doesn't really state this.  Objective: Vitals:   09/26/19 0340 09/26/19 0403 09/26/19 0734 09/26/19 1653  BP: (!) 117/56  (!) 110/56 107/66  Pulse: 93  87 87  Resp: 16  18 17   Temp: 98.4 F (36.9 C)  97.6 F (36.4 C) 98.1 F (36.7 C)  TempSrc: Oral  Oral   SpO2: 90% 92% 96% 99%  Weight:      Height:        Intake/Output Summary (Last 24 hours) at 09/26/2019 1727 Last data filed at 09/26/2019 0344 Gross per 24 hour  Intake --  Output 800 ml  Net -800 ml   Filed Weights   09/18/19 0631 09/19/19 0637 09/20/19 1252  Weight: (!) 161.6 kg (!) 159.9 kg (!) 159.9 kg    Examination:  General exam: Appears calm and comfortable  Respiratory system: Clear to auscultation. Respiratory effort normal. Cardiovascular system: S1 & S2 heard, RRR. No JVD, murmurs, rubs, gallops or clicks. No pedal edema. Gastrointestinal system: Abdomen is nondistended, soft and nontender. No organomegaly or masses felt. Normal bowel sounds heard.+fluid marking Central nervous system: Alert and oriented. Grossly intact Extremities: +edema b/l Skin: warm and dry Psychiatry: Judgement and insight appear normal. Mood & affect appropriate.     Data Reviewed: I have personally reviewed  following labs and imaging studies  CBC: Recent Labs  Lab 09/22/19 1047 09/23/19 0615 09/24/19 0309 09/25/19 0639 09/26/19 0435  WBC 13.5* 12.6* 13.4* 16.0* 17.9*  NEUTROABS 11.5* 10.6* 11.5* 14.3* 15.9*  HGB 7.7* 8.0* 7.7* 7.6* 7.9*  HCT 25.0* 25.2* 24.2* 25.3* 25.5*  MCV 100.4* 98.1 99.2 103.3* 102.8*  PLT 97* 103* 115* 120* 831*   Basic Metabolic Panel: Recent Labs  Lab 09/20/19 0725 09/20/19 0725 09/21/19 0524 09/21/19 0524 09/22/19 1047  09/23/19 0615 09/24/19 0309 09/25/19 0639 09/26/19 0435  NA 142   < > 145   < > 142 142 144 144 144  K 3.4*   < > 3.8   < > 3.6 3.3* 3.3* 3.4* 3.9  CL 104   < > 108   < > 102 103 104 102 102  CO2 30   < > 31   < > 29 30 31 31 31   GLUCOSE 132*   < > 168*   < > 167* 154* 140* 157* 167*  BUN 88*   < > 80*   < > 78* 85* 82* 81* 84*  CREATININE 3.07*   < > 2.75*   < > 3.12* 3.25* 3.15* 3.23* 3.36*  CALCIUM 8.4*   < > 8.3*   < > 8.2* 8.6* 8.3* 8.4* 8.6*  MG 2.3   < > 2.3   < > 2.1 1.9 2.0 2.1 2.0  PHOS 4.0  --  3.9  --  4.3 4.1  --   --   --    < > = values in this interval not displayed.   GFR: Estimated Creatinine Clearance: 26.5 mL/min (A) (by C-G formula based on SCr of 3.36 mg/dL (H)). Liver Function Tests: No results for input(s): AST, ALT, ALKPHOS, BILITOT, PROT, ALBUMIN in the last 168 hours. No results for input(s): LIPASE, AMYLASE in the last 168 hours. No results for input(s): AMMONIA in the last 168 hours. Coagulation Profile: No results for input(s): INR, PROTIME in the last 168 hours. Cardiac Enzymes: No results for input(s): CKTOTAL, CKMB, CKMBINDEX, TROPONINI in the last 168 hours. BNP (last 3 results) No results for input(s): PROBNP in the last 8760 hours. HbA1C: No results for input(s): HGBA1C in the last 72 hours. CBG: Recent Labs  Lab 09/25/19 1601 09/25/19 2130 09/26/19 0736 09/26/19 1155 09/26/19 1653  GLUCAP 175* 185* 147* 194* 185*   Lipid Profile: No results for input(s): CHOL, HDL, LDLCALC, TRIG, CHOLHDL, LDLDIRECT in the last 72 hours. Thyroid Function Tests: No results for input(s): TSH, T4TOTAL, FREET4, T3FREE, THYROIDAB in the last 72 hours. Anemia Panel: No results for input(s): VITAMINB12, FOLATE, FERRITIN, TIBC, IRON, RETICCTPCT in the last 72 hours. Sepsis Labs: No results for input(s): PROCALCITON, LATICACIDVEN in the last 168 hours.  No results found for this or any previous visit (from the past 240 hour(s)).       Radiology  Studies: No results found.      Scheduled Meds:  (feeding supplement) PROSource Plus  30 mL Oral TID WC   sodium chloride   Intravenous Once   Chlorhexidine Gluconate Cloth  6 each Topical Daily   citalopram  10 mg Oral Daily   collagenase   Topical Daily   epoetin (EPOGEN/PROCRIT) injection  20,000 Units Subcutaneous Weekly   feeding supplement (NEPRO CARB STEADY)  237 mL Oral TID BM   insulin aspart  0-5 Units Subcutaneous QHS   insulin aspart  0-9 Units Subcutaneous TID WC   levothyroxine  175  mcg Oral Daily   mouth rinse  15 mL Mouth Rinse BID   midodrine  10 mg Oral TID WC   multivitamin with minerals  1 tablet Oral Daily   nystatin cream   Topical BID   pantoprazole (PROTONIX) IV  40 mg Intravenous Q12H   senna  1 tablet Oral BID   zinc oxide  1 application Topical Daily   Continuous Infusions:  Assessment & Plan:   Active Problems:   Acute hypoxemic respiratory failure (HCC)   Acute kidney injury superimposed on CKD (HCC)   Hyperkalemia   NSTEMI (non-ST elevated myocardial infarction) (HCC)   Pressure injury of skin   Pulmonary hypertension, unspecified (HCC)   Persistent atrial fibrillation (HCC)   Anemia   Acute right-sided CHF (congestive heart failure) ([place])   Acute blood loss anemia   #1.  Acute hypoxemic respiratory failure. Appears to be multifactorial, due to severe pulmonary hypertension, right-sided congestive heart failure, morbid obesity with obesity hypoventilation syndrome, obstructive sleep apnea as well as aspiration pneumonia. Patient currently is stable on 2 L oxygen.   No acute issues overnight. Will continue to monitor   2.  Acute blood loss anemia secondary to upper GI bleed and secondary to gastric ulcers. Per Dr. Vicente Males from GI, patient hemoglobin is relatively stable, no plan for repeat EGD.  We continue on PPI IV twice daily   3.  Acute kidney injury on chronic kidney disease stage IIIb. Baseline creatinine  1.5 Temporary dialysis catheter was removed.  Good urine output noted . Nephrology following. Patient did get temporary dialysis initially. No immediate need for dialysis. Avoid nephrotoxins   4.  Acute right-sided congestive heart failure with anasarca and severe pulmonary hypertension. He is volume overloaded.  Dosing per renal No plans for right heart cath Cards following  5.  Hypotension. Resolved  6.  Obstructive sleep apnea. Continue CPAP while asleep  7.  Sacrum decubitus ulcer unstageable.  Patient had a stage IV sacral pressure ulcer at the time of admission, now has progressed to unstageable ulcer.  Wound care has seen the patient, recommend frequent turning and measures has changed.   DVT prophylaxis:  Code Status: Family Communication: Spoke to daughter and updated her about patient's status.  Discussed hospice versus going to SNF.  Daughter would like to speak to cardiology before deciding on any further management.  Patient also verbalizes understanding. Disposition Plan:  Status is: Inpatient  Remains inpatient appropriate because:Inpatient level of care appropriate due to severity of illness   Dispo: The patient is from: SNF              Anticipated d/c is to: SNF              Anticipated d/c date is: 2 days              Patient currently is not medically stable to d/c.            LOS: 18 days   Time spent: 35 minutes with more than 50% on [place], MD [facility] Pager [phone]  If 7PM-7AM, please contact night-coverage [email or web address] Password [facility] 09/26/2019, 5:27 PM

## 2019-09-26 NOTE — Progress Notes (Signed)
Progress Note  Patient Name: Andrew Valentine Date of Encounter: 09/26/2019  [facility] Cardiologist: Ida Rogue, MD   Subjective   Very drowsy this morning, daughter at bedside. Family considering hospice care for patient.   Inpatient Medications    Scheduled Meds: . (feeding supplement) PROSource Plus  30 mL Oral TID WC  . sodium chloride   Intravenous Once  . Chlorhexidine Gluconate Cloth  6 each Topical Daily  . citalopram  10 mg Oral Daily  . collagenase   Topical Daily  . epoetin (EPOGEN/PROCRIT) injection  20,000 Units Subcutaneous Weekly  . feeding supplement (NEPRO CARB STEADY)  237 mL Oral TID BM  . insulin aspart  0-5 Units Subcutaneous QHS  . insulin aspart  0-9 Units Subcutaneous TID WC  . levothyroxine  175 mcg Oral Daily  . mouth rinse  15 mL Mouth Rinse BID  . midodrine  10 mg Oral TID WC  . multivitamin with minerals  1 tablet Oral Daily  . nystatin cream   Topical BID  . pantoprazole (PROTONIX) IV  40 mg Intravenous Q12H  . senna  1 tablet Oral BID  . zinc oxide  1 application Topical Daily   Continuous Infusions:  PRN Meds: acetaminophen **OR** acetaminophen, heparin sodium (porcine), ipratropium-albuterol, ondansetron **OR** ondansetron (ZOFRAN) IV, ondansetron (ZOFRAN) IV, polyethylene glycol   Vital Signs    Vitals:   09/25/19 2338 09/26/19 0340 09/26/19 0403 09/26/19 0734  BP: (!) 121/56 (!) 117/56  (!) 110/56  Pulse: 77 93  87  Resp: 20 16  18   Temp: 99.3 F (37.4 C) 98.4 F (36.9 C)  97.6 F (36.4 C)  TempSrc: Oral Oral  Oral  SpO2: 96% 90% 92% 96%  Weight:      Height:        Intake/Output Summary (Last 24 hours) at 09/26/2019 1334 Last data filed at 09/26/2019 0344 Gross per 24 hour  Intake 0 ml  Output 800 ml  Net -800 ml   Last 3 Weights 09/20/2019 09/19/2019 09/18/2019  Weight (lbs) 352 lb 8.3 oz 352 lb 8 oz 356 lb 3.2 oz  Weight (kg) 159.9 kg 159.893 kg 161.571 kg      Telemetry    Atrial fibrillation, heart rate 94-  Personally Reviewed  ECG    New tracing- Personally Reviewed  Physical Exam   GEN: , looks tired, obese, soft-spoken HEENT: Normal NECK: No JVD; No carotid bruits CARDIAC:  irregularly irregular, distant heart sounds RESPIRATORY: Poor inspiratory effort, decreased breath sounds at bases ABDOMEN: Soft, non-tender, distended MUSCULOSKELETAL: 1+ edema; No deformity  SKIN: Warm and dry NEUROLOGIC: Alert and oriented x 3 PSYCHIATRIC: Normal affect   Labs    High Sensitivity Troponin:   Recent Labs  Lab 09/08/19 1205 09/08/19 1607 09/09/19 0607 09/09/19 1004  TROPONINIHS 181* 445* 1,541* 2,333*      Chemistry Recent Labs  Lab 09/24/19 0309 09/25/19 0639 09/26/19 0435  NA 144 144 144  K 3.3* 3.4* 3.9  CL 104 102 102  CO2 31 31 31   GLUCOSE 140* 157* 167*  BUN 82* 81* 84*  CREATININE 3.15* 3.23* 3.36*  CALCIUM 8.3* 8.4* 8.6*  GFRNONAA 18* 17* 16*  GFRAA 21* 20* 19*  ANIONGAP 9 11 11      Hematology Recent Labs  Lab 09/24/19 0309 09/25/19 0639 09/26/19 0435  WBC 13.4* 16.0* 17.9*  RBC 2.44* 2.45* 2.48*  HGB 7.7* 7.6* 7.9*  HCT 24.2* 25.3* 25.5*  MCV 99.2 103.3* 102.8*  MCH 31.6 31.0 31.9  MCHC 31.8 30.0 31.0  RDW 18.5* 18.7* 18.7*  PLT 115* 120* 131*    BNPNo results for input(s): BNP, PROBNP in the last 168 hours.   DDimer No results for input(s): DDIMER in the last 168 hours.   Radiology    No results found.  Cardiac Studies   Echo 08/2019 1. Left ventricular ejection fraction, by estimation, is 55 to 60%. The  left ventricle has normal function. The left ventricle has no regional  wall motion abnormalities. Left ventricular diastolic parameters are  indeterminate. Septal flattening in  systole consistent with elevated right heart pressures.  2. Right ventricular systolic function was not well visualized. The right  ventricular size is moderately enlarged. There is severely elevated  pulmonary artery systolic pressure. The estimated right  ventricular  systolic pressure is 62.9 mmHg.  3. Left atrial size was mildly dilated.   Patient Profile     79 y.o. male with history of morbid obesity, OSA, CKD, oulmonary hypertension being seen due to persistent atrial fibrillation.  Assessment & Plan    1.  Persistent atrial fibrillation -Heart rate currently controlled off AV nodal agents. -anemia preventing use of anticoagulation -Last EF 55 to 60%.  2.  Hypercapnic respiratory failure, OSA -CPAP  3.  Hypotension. -BP stable with midodrine -Continue midodrine  4.  Pulmonary hypertension -OSA, OHS, morbid obesity contributing. -diuresing/volume control as per renal  5.  CKD  -avoid nephrotoxics -Nephrology following.  -Unless source of bleeding or anemia is resolved, anticoagulation not recommended.  Heart rate controlled off AV nodal agents. Patient clinically appears more somnolent and more soft spoken. Prognosis, comobidities discussed at length with daughter. Palliative care involved with goals of care discussions. All questions were answered.  Greater than 50% was spent in counseling and coordination of care with patient Total encounter time 35 minutes or more   Signed, Kate Sable, MD  09/26/2019, 1:34 PM

## 2019-09-26 NOTE — Progress Notes (Signed)
[facility]  ROUNDING NOTE   Subjective:   09/14 0701 - 09/15 0700 In: 120 [P.O.:120] Out: 800 [Urine:800]  Pt seen at bedside.  Left IJ temporary dialysis catheter removed. Making good urine.     Objective:  Vital signs in last 24 hours:  Temp:  [97.6 F (36.4 C)-99.3 F (37.4 C)] 97.6 F (36.4 C) (09/15 0734) Pulse Rate:  [77-103] 87 (09/15 0734) Resp:  [16-20] 18 (09/15 0734) BP: (110-121)/(56-66) 110/56 (09/15 0734) SpO2:  [90 %-98 %] 96 % (09/15 0734)  Weight change:  Filed Weights   09/18/19 0631 09/19/19 0637 09/20/19 1252  Weight: (!) 161.6 kg (!) 159.9 kg (!) 159.9 kg    Intake/Output: I/O last 3 completed shifts: In: 120 [P.O.:120] Out: 1450 [Urine:1450]   Intake/Output this shift:  No intake/output data recorded.  Physical Exam: General: Awake,alert, in no acute distress  Head: Oral  mucous membrane moist  Lungs:  Diminished at the bases  Heart: S1S2, irregular rhythm  Abdomen:  Non tender,non distended  Extremities: 1+ bilateral lower extremity edema  Neurologic:  Awake, alert, conversant  Skin: No acute lesions  Access: LIJ temp HD catheter removed    Basic Metabolic Panel: Recent Labs  Lab 09/20/19 0725 09/20/19 0725 09/21/19 0524 09/21/19 0524 09/22/19 1047 09/22/19 1047 09/23/19 0615 09/23/19 0615 09/24/19 0309 09/25/19 0639 09/26/19 0435  NA 142   < > 145   < > 142  --  142  --  144 144 144  K 3.4*   < > 3.8   < > 3.6  --  3.3*  --  3.3* 3.4* 3.9  CL 104   < > 108   < > 102  --  103  --  104 102 102  CO2 30   < > 31   < > 29  --  30  --  31 31 31   GLUCOSE 132*   < > 168*   < > 167*  --  154*  --  140* 157* 167*  BUN 88*   < > 80*   < > 78*  --  85*  --  82* 81* 84*  CREATININE 3.07*   < > 2.75*   < > 3.12*  --  3.25*  --  3.15* 3.23* 3.36*  CALCIUM 8.4*   < > 8.3*   < > 8.2*   < > 8.6*   < > 8.3* 8.4* 8.6*  MG 2.3   < > 2.3   < > 2.1  --  1.9  --  2.0 2.1 2.0  PHOS 4.0  --  3.9  --  4.3  --  4.1  --   --   --    --    < > = values in this interval not displayed.    Liver Function Tests: No results for input(s): AST, ALT, ALKPHOS, BILITOT, PROT, ALBUMIN in the last 168 hours. No results for input(s): LIPASE, AMYLASE in the last 168 hours. No results for input(s): AMMONIA in the last 168 hours.  CBC: Recent Labs  Lab 09/22/19 1047 09/23/19 0615 09/24/19 0309 09/25/19 0639 09/26/19 0435  WBC 13.5* 12.6* 13.4* 16.0* 17.9*  NEUTROABS 11.5* 10.6* 11.5* 14.3* 15.9*  HGB 7.7* 8.0* 7.7* 7.6* 7.9*  HCT 25.0* 25.2* 24.2* 25.3* 25.5*  MCV 100.4* 98.1 99.2 103.3* 102.8*  PLT 97* 103* 115* 120* 131*    Cardiac Enzymes: No results for input(s): CKTOTAL, CKMB, CKMBINDEX, TROPONINI in the last 168 hours.  BNP: Invalid input(s): POCBNP  CBG: Recent Labs  Lab 09/25/19 0752 09/25/19 1126 09/25/19 1601 09/25/19 2130 09/26/19 0736  GLUCAP 161* 162* 175* 185* 147*    Microbiology: Results for orders placed or performed during the hospital encounter of 09/08/19  MRSA PCR Screening     Status: None   Collection Time: 09/08/19 11:49 AM   Specimen: Nasopharyngeal  Result Value Ref Range Status   MRSA by PCR NEGATIVE NEGATIVE Final    Comment:        The GeneXpert MRSA Assay (FDA approved for NASAL specimens only), is one component of a comprehensive MRSA colonization surveillance program. It is not intended to diagnose MRSA infection nor to guide or monitor treatment for MRSA infections. Performed at [facility], [address] [place], [place] [address]   SARS Coronavirus 2 by RT PCR (hospital order, performed in [facility] hospital lab) Nasopharyngeal Nasopharyngeal Swab     Status: None   Collection Time: 09/08/19 12:05 PM   Specimen: Nasopharyngeal Swab  Result Value Ref Range Status   SARS Coronavirus 2 NEGATIVE NEGATIVE Final    Comment: (NOTE) SARS-CoV-2 target nucleic acids are NOT DETECTED.  The SARS-CoV-2 RNA is generally detectable in upper and  lower respiratory specimens during the acute phase of infection. The lowest concentration of SARS-CoV-2 viral copies this assay can detect is 250 copies / mL. A negative result does not preclude SARS-CoV-2 infection and should not be used as the sole basis for treatment or other patient management decisions.  A negative result may occur with improper specimen collection / handling, submission of specimen other than nasopharyngeal swab, presence of viral mutation(s) within the areas targeted by this assay, and inadequate number of viral copies (<250 copies / mL). A negative result must be combined with clinical observations, patient history, and epidemiological information.  Fact Sheet for Patients:   StrictlyIdeas.no  Fact Sheet for Healthcare Providers: BankingDealers.co.za  This test is not yet approved or  cleared by the [place] FDA and has been authorized for detection and/or diagnosis of SARS-CoV-2 by FDA under an Emergency Use Authorization (EUA).  This EUA will remain in effect (meaning this test can be used) for the duration of the COVID-19 declaration under Section 564(b)(1) of the Act, 21 U.S.C. section 360bbb-3(b)(1), unless the authorization is terminated or revoked sooner.  Performed at [facility], [address] [place], [place] [address]   Blood culture (routine x 2)     Status: Abnormal   Collection Time: 09/08/19 12:07 PM   Specimen: BLOOD  Result Value Ref Range Status   Specimen Description   Final    BLOOD L DISTAL FOREARM Performed at [facility], [address] [place], [place] [address]    Special Requests   Final    BOTTLES DRAWN AEROBIC AND ANAEROBIC Blood Culture results may not be optimal due to an excessive volume of blood received in culture bottles Performed at [facility], [address] [place],  [address]    Culture  Setup Time   Final    GRAM  POSITIVE COCCI AEROBIC BOTTLE ONLY CRITICAL RESULT CALLED TO, READ BACK BY AND VERIFIED WITH: MORGAN CUNNINGHAM  AT 4193 ON 09/09/19 SNG    Culture (A)  Final    STAPHYLOCOCCUS EPIDERMIDIS THE SIGNIFICANCE OF ISOLATING THIS ORGANISM FROM A SINGLE SET OF BLOOD CULTURES WHEN MULTIPLE SETS ARE DRAWN IS UNCERTAIN. PLEASE NOTIFY THE MICROBIOLOGY DEPARTMENT WITHIN ONE WEEK IF SPECIATION AND SENSITIVITIES ARE REQUIRED.  Performed at [facility], [place] [address] [place], [place] [address]    Report Status 09/11/2019 FINAL  Final  Blood Culture ID Panel (Reflexed)     Status: Abnormal   Collection Time: 09/08/19 12:07 PM  Result Value Ref Range Status   Enterococcus faecalis NOT DETECTED NOT DETECTED Final   Enterococcus Faecium NOT DETECTED NOT DETECTED Final   Listeria monocytogenes NOT DETECTED NOT DETECTED Final   Staphylococcus species DETECTED (A) NOT DETECTED Final    Comment: CRITICAL RESULT CALLED TO, READ BACK BY AND VERIFIED WITH: MORGAN CUNNINGHAM AT 1345 ON 09/09/19 SNG    Staphylococcus aureus (BCID) NOT DETECTED NOT DETECTED Final   Staphylococcus epidermidis DETECTED (A) NOT DETECTED Final    Comment: CRITICAL RESULT CALLED TO, READ BACK BY AND VERIFIED WITH: MORGAN CUNNINGHAM AT 1345 ON 09/09/19 SNG    Staphylococcus lugdunensis NOT DETECTED NOT DETECTED Final   Streptococcus species NOT DETECTED NOT DETECTED Final   Streptococcus agalactiae NOT DETECTED NOT DETECTED Final   Streptococcus pneumoniae NOT DETECTED NOT DETECTED Final   Streptococcus pyogenes NOT DETECTED NOT DETECTED Final   A.calcoaceticus-baumannii NOT DETECTED NOT DETECTED Final   Bacteroides fragilis NOT DETECTED NOT DETECTED Final   Enterobacterales NOT DETECTED NOT DETECTED Final   Enterobacter cloacae complex NOT DETECTED NOT DETECTED Final   Escherichia coli NOT DETECTED NOT DETECTED Final   Klebsiella aerogenes NOT DETECTED NOT DETECTED Final   Klebsiella oxytoca NOT DETECTED NOT DETECTED Final    Klebsiella pneumoniae NOT DETECTED NOT DETECTED Final   Proteus species NOT DETECTED NOT DETECTED Final   Salmonella species NOT DETECTED NOT DETECTED Final   Serratia marcescens NOT DETECTED NOT DETECTED Final   Haemophilus influenzae NOT DETECTED NOT DETECTED Final   Neisseria meningitidis NOT DETECTED NOT DETECTED Final   Pseudomonas aeruginosa NOT DETECTED NOT DETECTED Final   Stenotrophomonas maltophilia NOT DETECTED NOT DETECTED Final   Candida albicans NOT DETECTED NOT DETECTED Final   Candida auris NOT DETECTED NOT DETECTED Final   Candida glabrata NOT DETECTED NOT DETECTED Final   Candida krusei NOT DETECTED NOT DETECTED Final   Candida parapsilosis NOT DETECTED NOT DETECTED Final   Candida tropicalis NOT DETECTED NOT DETECTED Final   Cryptococcus neoformans/gattii NOT DETECTED NOT DETECTED Final   Methicillin resistance mecA/C NOT DETECTED NOT DETECTED Final    Comment: Performed at [facility], [address] [place], [place] [address]  Blood culture (routine x 2)     Status: None   Collection Time: 09/08/19 12:59 PM   Specimen: BLOOD  Result Value Ref Range Status   Specimen Description BLOOD LEFT UPPER FORARM  Final   Special Requests   Final    BOTTLES DRAWN AEROBIC AND ANAEROBIC Blood Culture results may not be optimal due to an excessive volume of blood received in culture bottles   Culture   Final    NO GROWTH 5 DAYS Performed at [facility], [address] [place], [place] [address]    Report Status 09/13/2019 FINAL  Final    Coagulation Studies: No results for input(s): LABPROT, INR in the last 72 hours.  Urinalysis: No results for input(s): COLORURINE, LABSPEC, PHURINE, GLUCOSEU, HGBUR, BILIRUBINUR, KETONESUR, PROTEINUR, UROBILINOGEN, NITRITE, LEUKOCYTESUR in the last 72 hours.  Invalid input(s): APPERANCEUR    Imaging: No results found.   Medications:    . (feeding supplement) PROSource Plus  30 mL Oral TID WC  . sodium  chloride   Intravenous Once  . Chlorhexidine Gluconate  Cloth  6 each Topical Daily  . citalopram  10 mg Oral Daily  . collagenase   Topical Daily  . epoetin (EPOGEN/PROCRIT) injection  20,000 Units Subcutaneous Weekly  . feeding supplement (NEPRO CARB STEADY)  237 mL Oral TID BM  . insulin aspart  0-5 Units Subcutaneous QHS  . insulin aspart  0-9 Units Subcutaneous TID WC  . levothyroxine  175 mcg Oral Daily  . mouth rinse  15 mL Mouth Rinse BID  . midodrine  10 mg Oral TID WC  . multivitamin with minerals  1 tablet Oral Daily  . nystatin cream   Topical BID  . pantoprazole (PROTONIX) IV  40 mg Intravenous Q12H  . senna  1 tablet Oral BID  . zinc oxide  1 application Topical Daily   acetaminophen **OR** acetaminophen, heparin sodium (porcine), ipratropium-albuterol, ondansetron **OR** ondansetron (ZOFRAN) IV, ondansetron (ZOFRAN) IV, polyethylene glycol  Assessment/ Plan:  Mr. Dakai Braithwaite is a 79 y.o. 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  Baseline Creatinine 1.5  Nonoliguric urine output. 09/14 0701 - 09/15 0700 In: 120 [P.O.:120] Out: 800 [Urine:800]   -Temporary dialysis catheter removed.  Good urine output noted.  Creatinine still high at 3.3.  No immediate need for dialysis.  # Hypotension -Maintain the patient on midodrine.  #Bacterial  pneumonia Has been treated with antibiotics this admission.  #Anemia of chronic disease Lab Results  Component Value Date   HGB 7.9 (L) 09/26/2019   Maintain the patient on Epogen 20,000 units subcutaneous weekly.      LOS: 18 Carleen Rhue 9/15/20219:28 AM

## 2019-09-27 LAB — RENAL FUNCTION PANEL
Albumin: 2.5 g/dL — ABNORMAL LOW (ref 3.5–5.0)
Anion gap: 7 (ref 5–15)
BUN: 90 mg/dL — ABNORMAL HIGH (ref 8–23)
CO2: 32 mmol/L (ref 22–32)
Calcium: 8.6 mg/dL — ABNORMAL LOW (ref 8.9–10.3)
Chloride: 103 mmol/L (ref 98–111)
Creatinine, Ser: 3.7 mg/dL — ABNORMAL HIGH (ref 0.61–1.24)
GFR calc Af Amer: 17 mL/min — ABNORMAL LOW (ref >60)
GFR calc non Af Amer: 15 mL/min — ABNORMAL LOW (ref >60)
Glucose, Bld: 139 mg/dL — ABNORMAL HIGH (ref 70–99)
Phosphorus: 3.9 mg/dL (ref 2.5–4.6)
Potassium: 4 mmol/L (ref 3.5–5.1)
Sodium: 142 mmol/L (ref 135–145)

## 2019-09-27 LAB — GLUCOSE, CAPILLARY
Glucose-Capillary: 140 mg/dL — ABNORMAL HIGH (ref 70–99)
Glucose-Capillary: 157 mg/dL — ABNORMAL HIGH (ref 70–99)
Glucose-Capillary: 148 mg/dL — ABNORMAL HIGH (ref 70–99)
Glucose-Capillary: 132 mg/dL — ABNORMAL HIGH (ref 70–99)

## 2019-09-27 LAB — MAGNESIUM: Magnesium: 2.1 mg/dL (ref 1.7–2.4)

## 2019-09-27 MED ORDER — MAGIC MOUTHWASH
10.0000 mL | Freq: Three times a day (TID) | ORAL | Status: DC
  Administered 2019-09-27 – 2019-09-28 (×2): 10 mL via ORAL
  Filled 2019-09-27 (×8): qty 10

## 2019-09-27 MED ORDER — MORPHINE SULFATE (PF) 2 MG/ML IV SOLN
2.0000 mg | INTRAVENOUS | Status: DC | PRN
  Administered 2019-09-27: 2 mg via INTRAVENOUS
  Filled 2019-09-27: qty 1

## 2019-09-27 MED ORDER — MAGIC MOUTHWASH W/LIDOCAINE
10.0000 mL | Freq: Three times a day (TID) | ORAL | Status: DC
  Filled 2019-09-27 (×3): qty 10

## 2019-09-27 MED ORDER — LIDOCAINE VISCOUS HCL 2 % MT SOLN
10.0000 mL | Freq: Three times a day (TID) | OROMUCOSAL | Status: DC
  Administered 2019-09-27 – 2019-09-28 (×2): 10 mL via OROMUCOSAL
  Filled 2019-09-27 (×8): qty 15

## 2019-09-27 NOTE — Progress Notes (Signed)
Patient respirations 32 rigid, given PRN morphine for dyspnea / SOB

## 2019-09-27 NOTE — Progress Notes (Signed)
Occupational Therapy Treatment Patient Details Name: Andrew Valentine MRN: [id number] DOB: January 24, 1940 Today's Date: 09/27/2019    History of present illness Pt is a 79 year old male with a known history of morbid obesity, sleep apnea intolerant noncompliant to CPAP, hypertension, diabetes, hyperlipidemia, hypothyroidism, remote history of tobacco abuse comes to the emergency room with altered mental status and hypoxia.  Pt was on CRRT starting 09/09/19 now off CRRT with plan for PermCath placement 09/14/19.  MD assessment includes: Acute on chronic kidney failure, acute hypoxic respiratory failure, pulmonary HTN, anemia, and hypotension.   OT comments  Pt seen for OT tx this date to f/u re: safety with ADLs. OT facilitates pt participation in limited bed level UB ADLs including sipping water with MOD/MAX hand over hand and applying lip balm. Extended time required for both ADLs. Dtr present throughout and voices concern that he may be declining. Pt only minimally attentive and demos very limited functional activity tolerance this date. Pt appears fatigued/lethargic this date with minimal attention/energy/engagement throughout session. OT ensures that all pt's needs within reach including call light. OT discusses pt tolerance for session with RN. Will continue to follow and assess for appropriate d/c recommendations. Will continue to recommend SNF at this time, but will coordinate as applicable with pt/family goals/decisions.    Follow Up Recommendations  SNF;Other (comment) (will continue to assess for appropriateness)    Equipment Recommendations  Other (comment) (defer)    Recommendations for Other Services      Precautions / Restrictions Precautions Precautions: Fall Restrictions Weight Bearing Restrictions: No       Mobility Bed Mobility                  Transfers                      Balance                                           ADL either  performed or assessed with clinical judgement   ADL Overall ADL's : Needs assistance/impaired Eating/Feeding: Moderate assistance;Maximal assistance Eating/Feeding Details (indicate cue type and reason): HOB elevated, hand over hand to use B UEs to complete hand to mouth with cup with lid and straw. Pt with increased difficulty today. Requires initiation cue as well as increased assist versus previous sessions. extended time for each sip. Grooming: Moderate assistance;Maximal assistance;Bed level Grooming Details (indicate cue type and reason): hand over hand to don lip balm, extended time required, tactile cues                                     Vision Patient Visual Report: No change from baseline     Perception     Praxis      Cognition Arousal/Alertness: Lethargic Behavior During Therapy: WFL for tasks assessed/performed;Flat affect Overall Cognitive Status: Difficult to assess                                 General Comments: While pt is able to state the location and year, he minimally speaks throughout session. Eyes are half closed most of the time, but he will attend with tactile cues. He appears fatigued and daughter who is present  in room endorses that all day today, her father has been more lethargic/less talkative/attentive. He does follow simple one step commands appropriately, but it is obvoiusly effortful.        Exercises Other Exercises Other Exercises: OT facilitates pt participation in limited bed level UB ADLs including sipping water with MOD/MAX hand over hand and applying lip balm. Extended time required for both ADLs. Dtr present throughout and voices concern that he may be declining. Pt only minimally attentive and demos very limited functional activity tolerance this date.   Shoulder Instructions       General Comments      Pertinent Vitals/ Pain       Pain Assessment: Faces Faces Pain Scale: No hurt Pain Location: pt  minimally speaks today. No indication of pain.  Home Living                                          Prior Functioning/Environment              Frequency  Min 1X/week        Progress Toward Goals  OT Goals(current goals can now be found in the care plan section)  Progress towards OT goals: Not progressing toward goals - comment (pt with decreased progress toward OT goals this date, very limited functional activity tolerance/attentiveness/energy/engagement.)  Acute Rehab OT Goals Patient Stated Goal: to be comfortable OT Goal Formulation: With patient/family Time For Goal Achievement: 10/08/19 Potential to Achieve Goals: Good  Plan Frequency remains appropriate;Other (comment) (will continue to assess whether SNF is most appropriate d/c recommendation in coordination with pt/family decision making.)    Co-evaluation                 AM-PAC OT "6 Clicks" Daily Activity     Outcome Measure   Help from another person eating meals?: A Lot Help from another person taking care of personal grooming?: A Lot Help from another person toileting, which includes using toliet, bedpan, or urinal?: Total Help from another person bathing (including washing, rinsing, drying)?: Total Help from another person to put on and taking off regular upper body clothing?: A Lot Help from another person to put on and taking off regular lower body clothing?: Total 6 Click Score: 9    End of Session Equipment Utilized During Treatment: Oxygen  OT Visit Diagnosis: Muscle weakness (generalized) (M62.81);History of falling (Z91.81)   Activity Tolerance Patient limited by fatigue;Patient limited by lethargy   Patient Left in bed;with call bell/phone within reach;with bed alarm set;with family/visitor present (daughter present throughout)   Nurse Communication Other (comment) (general performance/tolerance observations this date-worsened attentiveness/fxl activity tolerance)         Time: 9935-7017 OT Time Calculation (min): 12 min  Charges: OT General Charges $OT Visit: 1 Visit OT Treatments $Self Care/Home Management : 8-22 mins  Gerrianne Scale, [place], OTR/L ascom [phone] 09/27/19, 5:27 PM

## 2019-09-27 NOTE — Progress Notes (Signed)
[facility]  ROUNDING NOTE   Subjective:   09/15 0701 - 09/16 0700 In: -  Out: 600 [Urine:600]  Patient remains quite weak. Resting in bed. Urine output did drop a bit yesterday to 600 cc. Creatinine currently 3.3.     Objective:  Vital signs in last 24 hours:  Temp:  [98.1 F (36.7 C)-98.8 F (37.1 C)] 98.8 F (37.1 C) (09/16 0747) Pulse Rate:  [81-107] 107 (09/16 0747) Resp:  [16-17] 16 (09/16 0747) BP: (100-108)/(45-66) 108/45 (09/16 0747) SpO2:  [93 %-99 %] 96 % (09/16 0747)  Weight change:  Filed Weights   09/18/19 0631 09/19/19 0637 09/20/19 1252  Weight: (!) 161.6 kg (!) 159.9 kg (!) 159.9 kg    Intake/Output: I/O last 3 completed shifts: In: -  Out: 1400 [Urine:1400]   Intake/Output this shift:  No intake/output data recorded.  Physical Exam: General: Awake,alert, in no acute distress  Head: Oral  mucous membrane moist  Lungs:  Diminished at the bases  Heart: S1S2, irregular rhythm  Abdomen:  Non tender,non distended  Extremities: 1+ bilateral lower extremity edema  Neurologic:  Awake, alert, conversant  Skin: No acute lesions  Access: LIJ temp HD catheter removed    Basic Metabolic Panel: Recent Labs  Lab 09/21/19 0524 09/21/19 0524 09/22/19 1047 09/22/19 1047 09/23/19 0615 09/23/19 0615 09/24/19 0309 09/25/19 0639 09/26/19 0435 09/27/19 0609  NA 145   < > 142  --  142  --  144 144 144  --   K 3.8   < > 3.6  --  3.3*  --  3.3* 3.4* 3.9  --   CL 108   < > 102  --  103  --  104 102 102  --   CO2 31   < > 29  --  30  --  31 31 31   --   GLUCOSE 168*   < > 167*  --  154*  --  140* 157* 167*  --   BUN 80*   < > 78*  --  85*  --  82* 81* 84*  --   CREATININE 2.75*   < > 3.12*  --  3.25*  --  3.15* 3.23* 3.36*  --   CALCIUM 8.3*   < > 8.2*   < > 8.6*   < > 8.3* 8.4* 8.6*  --   MG 2.3   < > 2.1   < > 1.9  --  2.0 2.1 2.0 2.1  PHOS 3.9  --  4.3  --  4.1  --   --   --   --   --    < > = values in this interval not displayed.     Liver Function Tests: No results for input(s): AST, ALT, ALKPHOS, BILITOT, PROT, ALBUMIN in the last 168 hours. No results for input(s): LIPASE, AMYLASE in the last 168 hours. No results for input(s): AMMONIA in the last 168 hours.  CBC: Recent Labs  Lab 09/22/19 1047 09/23/19 0615 09/24/19 0309 09/25/19 0639 09/26/19 0435  WBC 13.5* 12.6* 13.4* 16.0* 17.9*  NEUTROABS 11.5* 10.6* 11.5* 14.3* 15.9*  HGB 7.7* 8.0* 7.7* 7.6* 7.9*  HCT 25.0* 25.2* 24.2* 25.3* 25.5*  MCV 100.4* 98.1 99.2 103.3* 102.8*  PLT 97* 103* 115* 120* 131*    Cardiac Enzymes: No results for input(s): CKTOTAL, CKMB, CKMBINDEX, TROPONINI in the last 168 hours.  BNP: Invalid input(s): POCBNP  CBG: Recent Labs  Lab 09/26/19 0736 09/26/19 1155 09/26/19 1653  09/26/19 2108 09/27/19 0747  GLUCAP 147* 194* 185* 202* 140*    Microbiology: Results for orders placed or performed during the hospital encounter of 09/08/19  MRSA PCR Screening     Status: None   Collection Time: 09/08/19 11:49 AM   Specimen: Nasopharyngeal  Result Value Ref Range Status   MRSA by PCR NEGATIVE NEGATIVE Final    Comment:        The GeneXpert MRSA Assay (FDA approved for NASAL specimens only), is one component of a comprehensive MRSA colonization surveillance program. It is not intended to diagnose MRSA infection nor to guide or monitor treatment for MRSA infections. Performed at [facility], [address] [place], [place] [address]   SARS Coronavirus 2 by RT PCR (hospital order, performed in [facility] hospital lab) Nasopharyngeal Nasopharyngeal Swab     Status: None   Collection Time: 09/08/19 12:05 PM   Specimen: Nasopharyngeal Swab  Result Value Ref Range Status   SARS Coronavirus 2 NEGATIVE NEGATIVE Final    Comment: (NOTE) SARS-CoV-2 target nucleic acids are NOT DETECTED.  The SARS-CoV-2 RNA is generally detectable in upper and lower respiratory specimens during the acute phase of infection.  The lowest concentration of SARS-CoV-2 viral copies this assay can detect is 250 copies / mL. A negative result does not preclude SARS-CoV-2 infection and should not be used as the sole basis for treatment or other patient management decisions.  A negative result may occur with improper specimen collection / handling, submission of specimen other than nasopharyngeal swab, presence of viral mutation(s) within the areas targeted by this assay, and inadequate number of viral copies (<250 copies / mL). A negative result must be combined with clinical observations, patient history, and epidemiological information.  Fact Sheet for Patients:   StrictlyIdeas.no  Fact Sheet for Healthcare Providers: BankingDealers.co.za  This test is not yet approved or  cleared by the [place] FDA and has been authorized for detection and/or diagnosis of SARS-CoV-2 by FDA under an Emergency Use Authorization (EUA).  This EUA will remain in effect (meaning this test can be used) for the duration of the COVID-19 declaration under Section 564(b)(1) of the Act, 21 U.S.C. section 360bbb-3(b)(1), unless the authorization is terminated or revoked sooner.  Performed at [facility], [address] [place], [place] [address]   Blood culture (routine x 2)     Status: Abnormal   Collection Time: 09/08/19 12:07 PM   Specimen: BLOOD  Result Value Ref Range Status   Specimen Description   Final    BLOOD L DISTAL FOREARM Performed at [facility], [address], [place], [place] [address]    Special Requests   Final    BOTTLES DRAWN AEROBIC AND ANAEROBIC Blood Culture results may not be optimal due to an excessive volume of blood received in culture bottles Performed at [facility], [address] [place], [place] [address]    Culture  Setup Time   Final    GRAM POSITIVE COCCI AEROBIC BOTTLE ONLY CRITICAL RESULT CALLED TO, READ  BACK BY AND VERIFIED WITH: MORGAN CUNNINGHAM  AT 6295 ON 09/09/19 SNG    Culture (A)  Final    STAPHYLOCOCCUS EPIDERMIDIS THE SIGNIFICANCE OF ISOLATING THIS ORGANISM FROM A SINGLE SET OF BLOOD CULTURES WHEN MULTIPLE SETS ARE DRAWN IS UNCERTAIN. PLEASE NOTIFY THE MICROBIOLOGY DEPARTMENT WITHIN ONE WEEK IF SPECIATION AND SENSITIVITIES ARE REQUIRED. Performed at [facility], [place] [address], [place], [place] [address]  Report Status 09/11/2019 FINAL  Final  Blood Culture ID Panel (Reflexed)     Status: Abnormal   Collection Time: 09/08/19 12:07 PM  Result Value Ref Range Status   Enterococcus faecalis NOT DETECTED NOT DETECTED Final   Enterococcus Faecium NOT DETECTED NOT DETECTED Final   Listeria monocytogenes NOT DETECTED NOT DETECTED Final   Staphylococcus species DETECTED (A) NOT DETECTED Final    Comment: CRITICAL RESULT CALLED TO, READ BACK BY AND VERIFIED WITH: MORGAN CUNNINGHAM AT 1345 ON 09/09/19 SNG    Staphylococcus aureus (BCID) NOT DETECTED NOT DETECTED Final   Staphylococcus epidermidis DETECTED (A) NOT DETECTED Final    Comment: CRITICAL RESULT CALLED TO, READ BACK BY AND VERIFIED WITH: MORGAN CUNNINGHAM AT 1345 ON 09/09/19 SNG    Staphylococcus lugdunensis NOT DETECTED NOT DETECTED Final   Streptococcus species NOT DETECTED NOT DETECTED Final   Streptococcus agalactiae NOT DETECTED NOT DETECTED Final   Streptococcus pneumoniae NOT DETECTED NOT DETECTED Final   Streptococcus pyogenes NOT DETECTED NOT DETECTED Final   A.calcoaceticus-baumannii NOT DETECTED NOT DETECTED Final   Bacteroides fragilis NOT DETECTED NOT DETECTED Final   Enterobacterales NOT DETECTED NOT DETECTED Final   Enterobacter cloacae complex NOT DETECTED NOT DETECTED Final   Escherichia coli NOT DETECTED NOT DETECTED Final   Klebsiella aerogenes NOT DETECTED NOT DETECTED Final   Klebsiella oxytoca NOT DETECTED NOT DETECTED Final   Klebsiella pneumoniae NOT DETECTED NOT DETECTED Final   Proteus  species NOT DETECTED NOT DETECTED Final   Salmonella species NOT DETECTED NOT DETECTED Final   Serratia marcescens NOT DETECTED NOT DETECTED Final   Haemophilus influenzae NOT DETECTED NOT DETECTED Final   Neisseria meningitidis NOT DETECTED NOT DETECTED Final   Pseudomonas aeruginosa NOT DETECTED NOT DETECTED Final   Stenotrophomonas maltophilia NOT DETECTED NOT DETECTED Final   Candida albicans NOT DETECTED NOT DETECTED Final   Candida auris NOT DETECTED NOT DETECTED Final   Candida glabrata NOT DETECTED NOT DETECTED Final   Candida krusei NOT DETECTED NOT DETECTED Final   Candida parapsilosis NOT DETECTED NOT DETECTED Final   Candida tropicalis NOT DETECTED NOT DETECTED Final   Cryptococcus neoformans/gattii NOT DETECTED NOT DETECTED Final   Methicillin resistance mecA/C NOT DETECTED NOT DETECTED Final    Comment: Performed at [facility], [address] [place], [place] [address]  Blood culture (routine x 2)     Status: None   Collection Time: 09/08/19 12:59 PM   Specimen: BLOOD  Result Value Ref Range Status   Specimen Description BLOOD LEFT UPPER FORARM  Final   Special Requests   Final    BOTTLES DRAWN AEROBIC AND ANAEROBIC Blood Culture results may not be optimal due to an excessive volume of blood received in culture bottles   Culture   Final    NO GROWTH 5 DAYS Performed at [facility], [address] [place], [place] [address]    Report Status 09/13/2019 FINAL  Final    Coagulation Studies: No results for input(s): LABPROT, INR in the last 72 hours.  Urinalysis: No results for input(s): COLORURINE, LABSPEC, PHURINE, GLUCOSEU, HGBUR, BILIRUBINUR, KETONESUR, PROTEINUR, UROBILINOGEN, NITRITE, LEUKOCYTESUR in the last 72 hours.  Invalid input(s): APPERANCEUR    Imaging: No results found.   Medications:    . (feeding supplement) PROSource Plus  30 mL Oral TID WC  . sodium chloride   Intravenous Once  . Chlorhexidine Gluconate Cloth  6 each  Topical Daily  . citalopram  10 mg Oral Daily  .  collagenase   Topical Daily  . epoetin (EPOGEN/PROCRIT) injection  20,000 Units Subcutaneous Weekly  . feeding supplement (NEPRO CARB STEADY)  237 mL Oral TID BM  . insulin aspart  0-5 Units Subcutaneous QHS  . insulin aspart  0-9 Units Subcutaneous TID WC  . levothyroxine  175 mcg Oral Daily  . mouth rinse  15 mL Mouth Rinse BID  . midodrine  10 mg Oral TID WC  . multivitamin with minerals  1 tablet Oral Daily  . nystatin cream   Topical BID  . pantoprazole (PROTONIX) IV  40 mg Intravenous Q12H  . senna  1 tablet Oral BID  . zinc oxide  1 application Topical Daily   acetaminophen **OR** acetaminophen, heparin sodium (porcine), ipratropium-albuterol, ondansetron **OR** ondansetron (ZOFRAN) IV, ondansetron (ZOFRAN) IV, polyethylene glycol  Assessment/ Plan:  Mr. Andrew Valentine is a 78 y.o. 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, hypoventilation syndrome, hyperlipidemia who is admitted to [facility] on 09/08/2019 for Acidosis [E87.2] Hyperkalemia [E87.5] ATN (acute tubular necrosis) (HCC) [N17.0] Acute renal failure with tubular necrosis (HCC) [N17.0] Acute renal failure (HCC) [N17.9] Hypoxia [R09.02] NSTEMI (non-ST elevated myocardial infarction) ([place]) [I21.4] AKI (acute kidney injury) ([place]) [N17.9] Acute hypoxemic respiratory failure (HCC) [J96.01] Hypotension due to hypovolemia [I95.89, E86.1]  # Acute renal failure And anasarca Chronic kidney disease stage IIIB.  Baseline Creatinine 1.5  Nonoliguric urine output. 09/15 0701 - 09/16 0700 In: -  Out: 600 [Urine:600]   -Urine output yesterday was 600 cc.  Recommend repeating renal function periodically.  # Hypotension -Continue current dosage of midodrine.  #Bacterial pneumonia Has been treated with antibiotics this admission.  #Anemia of chronic disease Lab Results  Component Value Date   HGB  7.9 (L) 09/26/2019   Hemoglobin 7.9 as above.  Maintain the patient on current dosage of Epogen.      LOS: 19 Athelene Hursey 9/16/202110:48 AM

## 2019-09-27 NOTE — Care Management Important Message (Signed)
Important Message  Patient Details  Name: Andrew Valentine MRN: [id number] Date of Birth: 1940/01/25   Medicare Important Message Given:  Yes     Dannette Barbara 09/27/2019, 1:06 PM

## 2019-09-27 NOTE — Progress Notes (Signed)
PROGRESS NOTE    Andrew Valentine  NIO:[id number] DOB: [birth year] DOA: 09/08/2019 PCP: [facility]    Brief Narrative:  [phone] hx of Morbid obesity BMI >50, CHF, DM, hypothoroidism, came from home due to AKI. He lives alone and cannot take care of himself. He was noted to be lethargic in ED with hypercapnia and hypoxemia. Daughter she states she came to check on him and blood glucose was 47, he was unable to get out of chair. He was weak could not walk, desaturating 70s. He was encephalopathic at this time. Daughter lives next door.Was admitted to ICU initially. Patient had a significant weight gain over the last 2 weeks. He has anasarca. He was placed on CRRT. 09/10/19- patient is awake this am, he on levphed 25, hes on CRRT with dyasylate modifcation this am by renal team. S/p wound care eval, UOP has improved.  8/31- patient weaned off vasopressin, weaned levophed from 25>>10.  9/1- patient was evaluated by PALS, he does not wish to have permanent dialysis. Code status discussion in progress.  9/2- Plan to remove Left IJ HD catheter, trial of Lasix as per Nephrology 9/3- patient reports feeling well, he ate dinner states he feels well. S/p vascular eval for perm cath placement. 1 unit prbc transfustion ordered, heparin stopped due to borderline low h/h 9/4 medical therapy  9/8.Patient had a significant black stool, positive occult blood. Change Protonix to 40 mg IV twice a day, GI consult. He has been making large amount of urine, has not been dialyzed since 9/4.  9/9.EGD showed multiple gastric ulcers. No active bleeding. Hemoglobin dropped down to 7.0, transfuse 1 unit PRBC.  9/10.Hemoglobin stable, creatinine better at2.75.  9/12.Patient continued to have black stools, hemoglobin dropped down to 7.7 yesterday. Restart Protonix IV twice a day. Ask GI to see patient again.  9/13.  Goals of care discussion with the family, see today's progress  note.  9/15: spoke to family and daughter about goals of care. Daughter wants to discuss with cards about SNF    Consultants:   Cards, renal  Procedures: HD  Antimicrobials:       Subjective: Has no new complaints. Mildly sob lying in bed but doesn't really state this.  Objective: Vitals:   09/26/19 1653 09/26/19 2321 09/27/19 0747 09/27/19 1529  BP: 107/66 (!) 100/50 (!) 108/45 120/60  Pulse: 87 81 (!) 107 95  Resp: 17 17 16 16   Temp: 98.1 F (36.7 C) 98.2 F (36.8 C) 98.8 F (37.1 C) 98.7 F (37.1 C)  TempSrc:  Oral Oral Axillary  SpO2: 99% 93% 96% 95%  Weight:      Height:        Intake/Output Summary (Last 24 hours) at 09/27/2019 1536 Last data filed at 09/27/2019 0300 Gross per 24 hour  Intake --  Output 600 ml  Net -600 ml   Filed Weights   09/18/19 0631 09/19/19 0637 09/20/19 1252  Weight: (!) 161.6 kg (!) 159.9 kg (!) 159.9 kg    Examination: Comfortable, mildly become short of breath with talking but not new, NAD Clear to auscultation anteriorly, no wheeze rales rhonchi's Regular-irregular, S1-S2 no murmurs rubs Obese, positive bowel sounds, nontender nondistended soft Bilateral lower extremity mild edema Alert oriented x3 Mood and affect appropriate in current setting    Data Reviewed: I have personally reviewed following labs and imaging studies  CBC: Recent Labs  Lab 09/22/19 1047 09/23/19 0615 09/24/19 0309 09/25/19 0639 09/26/19 0435  WBC 13.5* 12.6* 13.4*  16.0* 17.9*  NEUTROABS 11.5* 10.6* 11.5* 14.3* 15.9*  HGB 7.7* 8.0* 7.7* 7.6* 7.9*  HCT 25.0* 25.2* 24.2* 25.3* 25.5*  MCV 100.4* 98.1 99.2 103.3* 102.8*  PLT 97* 103* 115* 120* 409*   Basic Metabolic Panel: Recent Labs  Lab 09/21/19 0524 09/21/19 0524 09/22/19 1047 09/22/19 1047 09/23/19 0615 09/24/19 0309 09/25/19 0639 09/26/19 0435 09/27/19 0609  NA 145   < > 142   < > 142 144 144 144 142  K 3.8   < > 3.6   < > 3.3* 3.3* 3.4* 3.9 4.0  CL 108   < > 102   < >  103 104 102 102 103  CO2 31   < > 29   < > 30 31 31 31  32  GLUCOSE 168*   < > 167*   < > 154* 140* 157* 167* 139*  BUN 80*   < > 78*   < > 85* 82* 81* 84* 90*  CREATININE 2.75*   < > 3.12*   < > 3.25* 3.15* 3.23* 3.36* 3.70*  CALCIUM 8.3*   < > 8.2*   < > 8.6* 8.3* 8.4* 8.6* 8.6*  MG 2.3   < > 2.1   < > 1.9 2.0 2.1 2.0 2.1  PHOS 3.9  --  4.3  --  4.1  --   --   --  3.9   < > = values in this interval not displayed.   GFR: Estimated Creatinine Clearance: 24 mL/min (A) (by C-G formula based on SCr of 3.7 mg/dL (H)). Liver Function Tests: Recent Labs  Lab 09/27/19 0609  ALBUMIN 2.5*   No results for input(s): LIPASE, AMYLASE in the last 168 hours. No results for input(s): AMMONIA in the last 168 hours. Coagulation Profile: No results for input(s): INR, PROTIME in the last 168 hours. Cardiac Enzymes: No results for input(s): CKTOTAL, CKMB, CKMBINDEX, TROPONINI in the last 168 hours. BNP (last 3 results) No results for input(s): PROBNP in the last 8760 hours. HbA1C: No results for input(s): HGBA1C in the last 72 hours. CBG: Recent Labs  Lab 09/26/19 1155 09/26/19 1653 09/26/19 2108 09/27/19 0747 09/27/19 1142  GLUCAP 194* 185* 202* 140* 157*   Lipid Profile: No results for input(s): CHOL, HDL, LDLCALC, TRIG, CHOLHDL, LDLDIRECT in the last 72 hours. Thyroid Function Tests: No results for input(s): TSH, T4TOTAL, FREET4, T3FREE, THYROIDAB in the last 72 hours. Anemia Panel: No results for input(s): VITAMINB12, FOLATE, FERRITIN, TIBC, IRON, RETICCTPCT in the last 72 hours. Sepsis Labs: No results for input(s): PROCALCITON, LATICACIDVEN in the last 168 hours.  No results found for this or any previous visit (from the past 240 hour(s)).       Radiology Studies: No results found.      Scheduled Meds: . (feeding supplement) PROSource Plus  30 mL Oral TID WC  . sodium chloride   Intravenous Once  . Chlorhexidine Gluconate Cloth  6 each Topical Daily  . citalopram  10  mg Oral Daily  . collagenase   Topical Daily  . epoetin (EPOGEN/PROCRIT) injection  20,000 Units Subcutaneous Weekly  . feeding supplement (NEPRO CARB STEADY)  237 mL Oral TID BM  . insulin aspart  0-5 Units Subcutaneous QHS  . insulin aspart  0-9 Units Subcutaneous TID WC  . levothyroxine  175 mcg Oral Daily  . magic mouthwash w/lidocaine  10 mL Oral TID AC & HS  . mouth rinse  15 mL Mouth Rinse BID  . midodrine  10 mg Oral TID WC  . multivitamin with minerals  1 tablet Oral Daily  . nystatin cream   Topical BID  . pantoprazole (PROTONIX) IV  40 mg Intravenous Q12H  . senna  1 tablet Oral BID  . zinc oxide  1 application Topical Daily   Continuous Infusions:  Assessment & Plan:   Active Problems:   Acute hypoxemic respiratory failure (HCC)   Acute kidney injury superimposed on CKD (HCC)   Hyperkalemia   NSTEMI (non-ST elevated myocardial infarction) (HCC)   Pressure injury of skin   Pulmonary hypertension, unspecified (HCC)   Persistent atrial fibrillation (HCC)   Anemia   Acute right-sided CHF (congestive heart failure) ([place])   Acute blood loss anemia   #1.  Acute hypoxemic respiratory failure. Appears to be multifactorial, due to severe pulmonary hypertension, right-sided congestive heart failure, morbid obesity with obesity hypoventilation syndrome, obstructive sleep apnea as well as aspiration pneumonia. Patient currently on 2 L oxygen  No new acute issues      2.  Acute blood loss anemia secondary to upper GI bleed and secondary to gastric ulcers. Hemoglobin is relatively stable, no plans for EGD, continue PPI, GI signed off     3.  Acute kidney injury on chronic kidney disease stage IIIb. Baseline creatinine 1.5 Temporary dialysis catheter was removed.  Good urine output noted . 9/16 nephrology following, recommend repeating renal function periodically.  Urine output yesterday was 600 cc  No immediate need for dialysis  Initially patient had temporary  dialysis  Avoid nephrotoxins     4.  Acute right-sided congestive heart failure with anasarca and severe pulmonary hypertension. Somewhat volume overloaded although difficult to say due to body habitus  No plans for right heart cath per cardiology   5.  Hypotension. Resolved, continue midodrine  6.  Obstructive sleep apnea. Continue on CPAP while asleep continue with CPAP  7.  Sacrum decubitus ulcer unstageable.  Patient had a stage IV sacral pressure ulcer at the time of admission, now has progressed to unstageable ulcer.  Wound care has seen the patient, recommend frequent turning and measures has changed.   DVT prophylaxis: SCD Code Status: DNR Family Communication: None at bedside   Status is: Inpatient  Remains inpatient appropriate because:Inpatient level of care appropriate due to severity of illness   Dispo: The patient is from: SNF              Anticipated d/c is to: SNF vs hospice              Anticipated d/c date is: 2 days              Patient currently medically not stable, family deciding on hospice v.s. snf.            LOS: 19 days   Time spent: 35 minutes with more than 50% on [place], MD [facility] Pager [phone]  If 7PM-7AM, please contact night-coverage [email or web address] Password [facility] 09/27/2019, 3:36 PM

## 2019-09-27 NOTE — Progress Notes (Addendum)
Daily Progress Note   Patient Name: Andrew Valentine       Date: 09/27/2019 DOB: [birth year]  Age: 79 y.o. MRN#: [id number] Attending Physician: Nolberto Hanlon, MD Primary Care Physician: [facility] [place] Date: 09/08/2019  Reason for Consultation/Follow-up: Establishing goals of care  Subjective: Patient is resting in bed. He shook and nodded his head but would not speak. He nods that he is tired. He shakes his head that he does not want to go to rehab. He nods that he wants to focus on comfort care. Spoke with daughter Daneil Dan who came into bedside. She states she and her brother have talked with patient and are in agreement with this plan. Recommend hospice facility placement.   No escalation in care. Family wants to continue current treatment until patient goes to hospice facility as they are waiting for son to come to town.   I completed a MOST form today and the signed original was placed in the chart. A photocopy was placed in the chart to be scanned into EMR. The patient outlined their wishes for the following treatment decisions:  Cardiopulmonary Resuscitation: Do Not Attempt Resuscitation (DNR/No CPR)  Medical Interventions: Comfort Measures: Keep clean, warm, and dry. Use medication by any route, positioning, wound care, and other measures to relieve pain and suffering. Use oxygen, suction and manual treatment of airway obstruction as needed for comfort. Do not transfer to the hospital unless comfort needs cannot be met in current location.  Antibiotics: No antibiotics (use other measures to relieve symptoms)  IV Fluids: No IV fluids (provide other measures to ensure comfort)  Feeding Tube: No feeding tube      Length of Stay: 19  Current Medications: Scheduled Meds:  .  (feeding supplement) PROSource Plus  30 mL Oral TID WC  . sodium chloride   Intravenous Once  . Chlorhexidine Gluconate Cloth  6 each Topical Daily  . citalopram  10 mg Oral Daily  . collagenase   Topical Daily  . epoetin (EPOGEN/PROCRIT) injection  20,000 Units Subcutaneous Weekly  . feeding supplement (NEPRO CARB STEADY)  237 mL Oral TID BM  . insulin aspart  0-5 Units Subcutaneous QHS  . insulin aspart  0-9 Units Subcutaneous TID WC  . levothyroxine  175 mcg Oral Daily  . mouth rinse  15 mL Mouth  Rinse BID  . midodrine  10 mg Oral TID WC  . multivitamin with minerals  1 tablet Oral Daily  . nystatin cream   Topical BID  . pantoprazole (PROTONIX) IV  40 mg Intravenous Q12H  . senna  1 tablet Oral BID  . zinc oxide  1 application Topical Daily    Continuous Infusions:   PRN Meds: acetaminophen **OR** acetaminophen, heparin sodium (porcine), ipratropium-albuterol, ondansetron **OR** ondansetron (ZOFRAN) IV, ondansetron (ZOFRAN) IV, polyethylene glycol  Physical Exam Pulmonary:     Effort: Pulmonary effort is normal.  Neurological:     Mental Status: He is alert.             Vital Signs: BP (!) 108/45 (BP Location: Right Arm)   Pulse (!) 107   Temp 98.8 F (37.1 C) (Oral)   Resp 16   Ht $R'5\' 8"'[place]$  (1.727 m)   Wt (!) 159.9 kg   SpO2 96%   BMI 53.60 kg/m  SpO2: SpO2: 96 % O2 Device: O2 Device: Nasal Cannula O2 Flow Rate: O2 Flow Rate (L/min): 3 L/min  Intake/output summary:   Intake/Output Summary (Last 24 hours) at 09/27/2019 1201 Last data filed at 09/27/2019 0300 Gross per 24 hour  Intake --  Output 600 ml  Net -600 ml   LBM: Last BM Date: 09/26/19 Baseline Weight: Weight: (!) 149.7 kg Most recent weight: Weight: (!) 159.9 kg       Palliative Assessment/Data:    Flowsheet Rows     Most Recent Value  Intake Tab  Referral Department Critical care  Unit at Time of Referral ICU  Date Notified 09/11/19  Palliative Care Type New Palliative care  Reason for  referral Clarify Goals of Care  Date of Admission 09/08/19  # of days IP prior to Palliative referral 3  Clinical Assessment  Psychosocial & Spiritual Assessment  Palliative Care Outcomes      Patient Active Problem List   Diagnosis Date Noted  . Acute right-sided CHF (congestive heart failure) ([place]) 09/19/2019  . Acute blood loss anemia 09/19/2019  . Persistent atrial fibrillation ([place]) 09/17/2019  . Anemia 09/17/2019  . Pulmonary hypertension, unspecified ([place])   . Pressure injury of skin 09/09/2019  . Hyperkalemia   . NSTEMI (non-ST elevated myocardial infarction) ([place])   . Acute hypoxemic respiratory failure ([place]) 09/08/2019  . Acute kidney injury superimposed on CKD ([place]) 09/08/2019  . Acute renal failure with tubular necrosis ([place])   . Hypoxia   . Hypotension due to hypovolemia   . Atrial fibrillation [facility])     Palliative Care Assessment & Plan   Recommendations/Plan:  Shifting to comfort focused care, but family wants to continue current treatment until patient goes to hospice facility as they are waiting for son to come to town. No escalation.   Recommend hospice facility placement.    Code Status:    Code Status Orders  (From admission, onward)         Start     Ordered   09/13/19 1312  Limited resuscitation (code)  Continuous       Question Answer Comment  In the event of cardiac or respiratory ARREST: Initiate Code Blue, Call Rapid Response Yes   In the event of cardiac or respiratory ARREST: Perform CPR Yes   In the event of cardiac or respiratory ARREST: Perform Intubation/Mechanical Ventilation No   In the event of cardiac or respiratory ARREST: Use NIPPV/BiPAp only if indicated Yes   In the event of cardiac or respiratory  ARREST: Administer ACLS medications if indicated Yes   In the event of cardiac or respiratory ARREST: Perform Defibrillation or Cardioversion if indicated Yes   Comments MOST form on chart.      09/13/19 1312        Code Status  History    Date Active Date Inactive Code Status Order ID Comments User Context   09/12/2019 1623 09/13/2019 1312 DNR [id number]  Asencion Gowda, NP Inpatient   09/08/2019 1850 09/12/2019 1623 Full Code [id number]  Fritzi Mandes, MD ED   09/08/2019 1845 09/08/2019 1850 Full Code [id number]  Fritzi Mandes, MD ED   09/08/2019 1551 09/08/2019 1845 DNR [id number]  Fritzi Mandes, MD ED   Advance Care Planning Activity       Prognosis:   < 2 weeks with stopping insulin and CPAP.    Thank you for allowing the Palliative Medicine Team to assist in the care of this patient.   Total Time 50 min Prolonged Time Billed  no      Greater than 50%  of this time was spent counseling and coordinating care related to the above assessment and plan.  Asencion Gowda, NP  Please contact Palliative Medicine Team phone at [phone] for questions and concerns.

## 2019-09-27 NOTE — Progress Notes (Signed)
GI note  Hb stable over 5 days   If there is a drop in Hb please call GI back . No change in BUN/cr ratio    I will sign off.  Please call me if any further GI concerns or questions.  We would like to thank you for the opportunity to participate in the care of Andrew Valentine.   Dr Jonathon Bellows MD,MRCP [facility]) Gastroenterology/Hepatology Pager: [phone]

## 2019-09-27 NOTE — Progress Notes (Signed)
[facility] Liaison note: New referral for [facility] hospice home received from Palliative NP [employer]. TOC Misty Green aware.  Patient information sent to referral and being reviewed by hospice physician Dr. Eulas Post.  Writer met in the room with patient and his daughter Daneil Dan to initiate education regarding hospice services, philosophy, team approach to care and current visitation policy with understanding voiced. Questions answered. Emotional support given. Patient was not able to participate in the conversation.  [facility] is unable to offer a bed today. Liaison to follow up with hospital care team and family tomorrow with bed availability. Thank you for the opportunity to be involved in the care of this patient and his family.  Flo Shanks BSN, RN, [place] [phone]

## 2019-09-27 NOTE — Plan of Care (Signed)
  Problem: Health Behavior/Discharge Planning: Goal: Ability to manage health-related needs will improve Outcome: Progressing   Problem: Clinical Measurements: Goal: Ability to maintain clinical measurements within normal limits will improve Outcome: Progressing Goal: Will remain free from infection Outcome: Progressing Goal: Diagnostic test results will improve Outcome: Progressing Goal: Respiratory complications will improve Outcome: Progressing Goal: Cardiovascular complication will be avoided Outcome: Progressing   Problem: Clinical Measurements: Goal: Will remain free from infection Outcome: Progressing   Problem: Activity: Goal: Risk for activity intolerance will decrease Outcome: Progressing   Problem: Nutrition: Goal: Adequate nutrition will be maintained Outcome: Progressing   Problem: Elimination: Goal: Will not experience complications related to urinary retention Outcome: Progressing   Problem: Pain Managment: Goal: General experience of comfort will improve Outcome: Progressing   Problem: Skin Integrity: Goal: Risk for impaired skin integrity will decrease Outcome: Progressing

## 2019-09-28 LAB — GLUCOSE, CAPILLARY
Glucose-Capillary: 142 mg/dL — ABNORMAL HIGH (ref 70–99)
Glucose-Capillary: 148 mg/dL — ABNORMAL HIGH (ref 70–99)

## 2019-09-28 LAB — MAGNESIUM: Magnesium: 2.2 mg/dL (ref 1.7–2.4)

## 2019-09-28 MED ORDER — ACETAMINOPHEN 325 MG PO TABS: 650.0000 mg | ORAL_TABLET | Freq: Four times a day (QID) | ORAL | Status: AC | PRN

## 2019-09-28 MED ORDER — ACETAMINOPHEN 650 MG RE SUPP: 650.0000 mg | Freq: Four times a day (QID) | RECTAL | 0 refills | Status: AC | PRN

## 2019-09-28 MED ORDER — IPRATROPIUM-ALBUTEROL 0.5-2.5 (3) MG/3ML IN SOLN: 3.0000 mL | Freq: Four times a day (QID) | RESPIRATORY_TRACT | Status: AC | PRN

## 2019-09-28 NOTE — Progress Notes (Signed)
OT Cancellation Note  Patient Details Name: Andrew Valentine MRN: [id number] DOB: August 27, 1940   Cancelled Treatment:    Reason Eval/Treat Not Completed: Other (comment)  Upon chart review, noted that plan is for pt to transfer to hospice house. Will complete OT orders at this time. Thank you.  Gerrianne Scale, Learned, OTR/L ascom [phone] 09/28/19, 12:27 PM

## 2019-09-28 NOTE — Progress Notes (Signed)
Per Dr. Montine Circle to maintain o2 sats .    09/28/19 0931  Oxygen Therapy  SpO2 97 %  O2 Device Non-rebreather Mask  O2 Flow Rate (L/min) 15 L/min

## 2019-09-28 NOTE — Progress Notes (Signed)
Placed on NWB at 15L , his o2 is 97% and pulse 94

## 2019-09-28 NOTE — Discharge Summary (Signed)
Andrew Valentine JAS:[id number] DOB: 02-23-1940 DOA: 09/08/2019  PCP: [facility] [place] date: 09/08/2019 Discharge date: 09/28/2019  Admitted From: Home Disposition: Hospice house     Discharge Condition:Stable CODE STATUS: DNR Diet recommendation: Regular Brief/Interim Summary: Andrew Valentine  is a 79 y.o. male with a known history of morbid obesity, sleep apnea intolerant noncompliant to CPAP, hypertension, diabetes, hyperlipidemia, hypothyroidism, remote history of tobacco abuse comes to the emergency room with altered mental status and hypoxia.Daughter had  stated she came to check on him and blood glucose was 47, he was unable to get out of chair. He was weak could not walk, desaturating 70s. He was encephalopathic at this time. Daughter lives next door.Was admitted to ICU initially.Patient had a significant weight gain and had anasarca. He was placed on CRRT. He was started on Levophed and finally weaned off of vasopressin.  Patient did not want to have permanent dialysis.  He had intermittent dialysis.  Nephrology was consulted.  During his hospitalization he did have significant black stool and was positive occult blood and was started on Protonix IV.  GI was consulted and he was transfused.  He did have a EGD showing multiple gastric ulcers but no active bleed.  Now patient is going to hospice house.   #1. Acute hypoxemic respiratory failure. Appears to be multifactorial, due to severe pulmonary hypertension, right-sided congestive heart failure, morbid obesity with obesity hypoventilation syndrome, obstructive sleep apnea as well as aspiration pneumonia. Was treated for CAP with antibiotic.   Patient currently on 2 L oxygen       2. Acute blood loss anemia secondary to upper GI bleed and secondary to gastric ulcers. Hemoglobin is relatively stable, was on PPI, GI signed off  Initially was on heparin gtt and d/c'd when h/h dropped. status post 1 unit packed red  blood cell on 9/3 1 unit of packed red blood cells on 9/4    3. Acute kidney injury on chronic kidney disease stage IIIb. Baseline creatinine 1.5 Started on HD initially. Then stopped , patient was making urine.  Temporary dialysis catheter was removed.   No immediate need for dialysis  Avoid nephrotoxins     4. Acute right-sided congestive heart failure with anasarca and severe pulmonary hypertension. Cardiology following No plans for right heart cath per cardiology   5. Persistent afib- rate relatively controlled Cardiology had no plans to restore to normal sinus. No anticoagulation due to drop in H/H requiring transfusions. EF 55-60%   6.Hypotension. Resolved with midrodrine  6. Obstructive sleep apnea. Continue on CPAP while asleep continue with CPAP  7. Sacrum decubitus ulcer unstageable. Patient had a stage IV sacral pressure ulcer at the time of admission, now has progressed to unstageable ulcer. Wound care has seen the patient, recommend frequent turning and measures has changed.    Discharge Diagnoses:  Active Problems:   Acute hypoxemic respiratory failure (HCC)   Acute kidney injury superimposed on CKD (HCC)   Hyperkalemia   NSTEMI (non-ST elevated myocardial infarction) (HCC)   Pressure injury of skin   Pulmonary hypertension, unspecified (HCC)   Persistent atrial fibrillation (HCC)   Anemia   Acute right-sided CHF (congestive heart failure) ([place])   Acute blood loss anemia    Discharge Instructions   Allergies as of 09/28/2019      Reactions   Penicillins Rash      Medication List    STOP taking these medications   atenolol 100 MG tablet Commonly known as: TENORMIN  atorvastatin 40 MG tablet Commonly known as: LIPITOR   glipiZIDE 5 MG tablet Commonly known as: GLUCOTROL   hydrochlorothiazide 25 MG tablet Commonly known as: HYDRODIURIL   levothyroxine 175 MCG tablet Commonly known as: SYNTHROID   lisinopril 5 MG  tablet Commonly known as: ZESTRIL     TAKE these medications   acetaminophen 325 MG tablet Commonly known as: TYLENOL Take 2 tablets (650 mg total) by mouth every 6 (six) hours as needed for mild pain (or Fever >/= 101).   acetaminophen 650 MG suppository Commonly known as: TYLENOL Place 1 suppository (650 mg total) rectally every 6 (six) hours as needed for mild pain (or Fever >/= 101).   ipratropium-albuterol 0.5-2.5 (3) MG/3ML Soln Commonly known as: DUONEB Take 3 mLs by nebulization every 6 (six) hours as needed.       Contact information for after-discharge care    [facility] .   Service: Skilled Nursing Contact information: [place] [phone]                 Allergies  Allergen Reactions  . Penicillins Rash    Consultations:  GI, nephrology , GI   Procedures/Studies: US RENAL  Result Date: 09/08/2019 CLINICAL DATA:  ATN EXAM: RENAL / URINARY TRACT ULTRASOUND COMPLETE COMPARISON:  None. FINDINGS: Right Kidney: Renal measurements: 10.2 x 4.8 x 5.2 cm = volume: 132 mL. Echogenicity within normal limits. No hydronephrosis. There is a cystic mass in the mid pole the right kidney measuring 1.2 cm. Left Kidney: Renal measurements: 9.1 x 4.8 x 4.3 cm = volume: 97 mL. Echogenicity within normal limits. No mass or hydronephrosis visualized. Bladder: Not visualized. Other: Exam limited by patient body habitus and inability to change position. IMPRESSION: 1. Limited exam. No definite acute finding in the bilateral kidneys. 2.  Small cyst in the midpole of the right kidney. Electronically Signed   By: Audie Pinto M.D.   On: 09/08/2019 17:06   PERIPHERAL VASCULAR CATHETERIZATION  Result Date: 09/14/2019 See Op Note  US Venous Img Lower Bilateral (DVT)  Result Date: 09/09/2019 CLINICAL DATA:  Swelling in the bilateral legs, rule out DVT. EXAM: BILATERAL LOWER EXTREMITY VENOUS  DOPPLER ULTRASOUND TECHNIQUE: Gray-scale sonography with compression, as well as color and duplex ultrasound, were performed to evaluate the deep venous system(s) from the level of the common femoral vein through the popliteal and proximal calf veins. COMPARISON:  None. FINDINGS: VENOUS Normal compressibility of the common femoral, superficial femoral, and popliteal veins, as well as the visualized calf veins. Visualized portions of profunda femoral vein and great saphenous vein unremarkable. No filling defects to suggest DVT on grayscale or color Doppler imaging. Doppler waveforms show normal direction of venous flow, normal respiratory plasticity and response to augmentation. Limited views of the contralateral common femoral vein are unremarkable. OTHER None. Limitations: none IMPRESSION: Negative for DVT in the bilateral lower extremities. Electronically Signed   By: Audie Pinto M.D.   On: 09/09/2019 14:38   DG Chest Port 1 View  Result Date: 09/17/2019 CLINICAL DATA:  Pneumonia. EXAM: PORTABLE CHEST 1 VIEW COMPARISON:  September 14, 2019. FINDINGS: Stable cardiomegaly. Left internal jugular dialysis catheter is unchanged in position. No pneumothorax is noted. Stable bilateral perihilar and basilar opacities are noted concerning for pulmonary edema. Small pleural effusions may be present. Bony thorax is unremarkable. IMPRESSION: Stable bilateral pulmonary edema. Electronically Signed   By: Bobbe Medico.D.  On: 09/17/2019 08:12   DG Chest Port 1 View  Result Date: 09/14/2019 CLINICAL DATA:  PICC line placement. EXAM: PORTABLE CHEST 1 VIEW COMPARISON:  Radiographs earlier the same date and 09/11/2019. FINDINGS: 1416 hours. There is a new left IJ hemodialysis catheter, projecting to the level of the mid right atrium. No visible PICC line. Stable cardiomegaly, aortic atherosclerosis and mild pulmonary edema. There are probable small bilateral pleural effusions. No pneumothorax. The bones appear  unchanged. IMPRESSION: 1. New left IJ hemodialysis catheter tip projects to the level of the mid right atrium. No visible PICC line. 2. Stable cardiomegaly, mild pulmonary edema and probable small bilateral pleural effusions. Electronically Signed   By: Richardean Sale M.D.   On: 09/14/2019 14:37   DG Chest Port 1 View  Result Date: 09/14/2019 CLINICAL DATA:  Acute respiratory failure with hypoxic EXAM: PORTABLE CHEST 1 VIEW COMPARISON:  09/11/2019 FINDINGS: Cardiomegaly and bilateral airspace opacities are again noted. No large pleural effusion or pneumothorax identified. No acute bony abnormalities are present. There has been little interval change since prior study except for removal of a LEFT IJ central venous catheter. IMPRESSION: Little significant change in bilateral airspace opacities and cardiomegaly. Electronically Signed   By: Margarette Canada M.D.   On: 09/14/2019 05:58   DG Chest Port 1 View  Result Date: 09/11/2019 CLINICAL DATA:  Shortness of breath. EXAM: PORTABLE CHEST 1 VIEW COMPARISON:  Chest radiograph dated 09/09/2019 FINDINGS: The heart is enlarged. A left internal jugular central venous catheter tip overlies the superior vena cava. Moderate to severe bilateral interstitial and airspace opacities appear slightly increased since prior exam. A left pleural effusion likely contributes. There is no definite right pleural effusion. There is no pneumothorax. IMPRESSION: Moderate to severe bilateral interstitial and airspace opacities appear slightly increased since prior exam. A left pleural effusion likely contributes. Electronically Signed   By: Zerita Boers M.D.   On: 09/11/2019 14:22   DG Chest Port 1 View  Result Date: 09/09/2019 CLINICAL DATA:  Central line placement, history of CHF EXAM: PORTABLE CHEST 1 VIEW COMPARISON:  09/08/2019 FINDINGS: Left IJ double-lumen central line tip overlies the SVC. No pneumothorax. There is similar lung aeration with patchy bilateral opacities. Probable  small bilateral pleural effusions. Top-normal heart size. Possible enlargement of the main pulmonary artery. IMPRESSION: Interval left IJ line placement with tip overlying SVC. No pneumothorax. Lung aeration is not substantially changed. Electronically Signed   By: Macy Mis M.D.   On: 09/09/2019 08:07   DG Chest Portable 1 View  Result Date: 09/08/2019 CLINICAL DATA:  Oxygen desaturation, hypoglycemia, shortness of breath, history COPD, CHF EXAM: PORTABLE CHEST 1 VIEW COMPARISON:  Portable exam 1224 hours without priors for comparison FINDINGS: Upper normal heart size. Mediastinal contours normal. Patchy infiltrates bilaterally greater on RIGHT, question pneumonia, asymmetric edema considered less likely. Probable small LEFT pleural effusion. No pneumothorax. IMPRESSION: BILATERAL pulmonary infiltrates favoring pneumonia. Probable small LEFT pleural effusion. Electronically Signed   By: Lavonia Dana M.D.   On: 09/08/2019 13:06   ECHOCARDIOGRAM COMPLETE  Result Date: 09/09/2019    ECHOCARDIOGRAM REPORT   Patient Name:   MALEKE FERIA   Date of Exam: 09/09/2019 Medical Rec #:  [id number]  Height:       68.0 in Accession #:    [id number] Weight:       330.0 lb Date of Birth:  1940/12/06  BSA:          2.528 m Patient Age:    82  years   BP:           115/48 mmHg Patient Gender: M          HR:           72 bpm. Exam Location:  [facility] Procedure: 2D Echo, Cardiac Doppler and Color Doppler Indications:     Atrial Fibrillation 427.31  History:         Patient has no prior history of Echocardiogram examinations.                  CHF; Risk Factors:Hypertension and Diabetes.  Sonographer:     Sherrie Sport RDCS (AE) Referring Phys:  [place] Diagnosing Phys: Ida Rogue MD  Sonographer Comments: No apical window, no subcostal window and Technically difficult study due to poor echo windows. IMPRESSIONS  1. Left ventricular ejection fraction, by estimation, is 55 to 60%. The left ventricle has normal function.  The left ventricle has no regional wall motion abnormalities. Left ventricular diastolic parameters are indeterminate. Septal flattening in systole consistent with elevated right heart pressures.  2. Right ventricular systolic function was not well visualized. The right ventricular size is moderately enlarged. There is severely elevated pulmonary artery systolic pressure. The estimated right ventricular systolic pressure is 31.5 mmHg.  3. Left atrial size was mildly dilated. FINDINGS  Left Ventricle: Left ventricular ejection fraction, by estimation, is 55 to 60%. The left ventricle has normal function. The left ventricle has no regional wall motion abnormalities. The left ventricular internal cavity size was normal in size. There is  no left ventricular hypertrophy. Left ventricular diastolic parameters are indeterminate. Right Ventricle: The right ventricular size is moderately enlarged. No increase in right ventricular wall thickness. Right ventricular systolic function was not well visualized. There is severely elevated pulmonary artery systolic pressure. The tricuspid  regurgitant velocity is 3.94 m/s, and with an assumed right atrial pressure of 10 mmHg, the estimated right ventricular systolic pressure is 40.0 mmHg. Left Atrium: Left atrial size was mildly dilated. Right Atrium: Right atrial size was normal in size. Pericardium: There is no evidence of pericardial effusion. Mitral Valve: The mitral valve was not well visualized. Normal mobility of the mitral valve leaflets. No evidence of mitral valve regurgitation. No evidence of mitral valve stenosis. Tricuspid Valve: The tricuspid valve is not well visualized. Tricuspid valve regurgitation is not demonstrated. No evidence of tricuspid stenosis. Aortic Valve: The aortic valve was not well visualized. Aortic valve regurgitation is not visualized. Mild aortic valve sclerosis is present, with no evidence of aortic valve stenosis. Pulmonic Valve: The pulmonic  valve was not well visualized. Pulmonic valve regurgitation is not visualized. No evidence of pulmonic stenosis. Aorta: The aortic root is normal in size and structure and the aortic root was not well visualized. Venous: The inferior vena cava is normal in size with greater than 50% respiratory variability, suggesting right atrial pressure of 3 mmHg. IAS/Shunts: No atrial level shunt detected by color flow Doppler.  LEFT VENTRICLE PLAX 2D LVIDd:         5.24 cm LVIDs:         2.96 cm LV PW:         1.11 cm LV IVS:        0.96 cm LVOT diam:     2.00 cm LVOT Area:     3.14 cm  LEFT ATRIUM         Index LA diam:    4.50 cm 1.78 cm/m  PULMONIC VALVE AORTA                 PV Vmax:        0.64 m/s Ao Root diam: 3.20 cm PV Peak grad:   1.6 mmHg                       RVOT Peak grad: 2 mmHg  TRICUSPID VALVE TR Peak grad:   62.1 mmHg TR Vmax:        394.00 cm/s  SHUNTS Systemic Diam: 2.00 cm Ida Rogue MD Electronically signed by Ida Rogue MD Signature Date/Time: 09/09/2019/3:00:55 PM    Final       Subjective: 02 sat dropped this am to 70% but pt was mentating well.   Discharge Exam: Vitals:   09/28/19 0800 09/28/19 0931  BP: (!) 116/49   Pulse: 91   Resp: 17   Temp: 98.4 F (36.9 C)   SpO2: (!) 78% 97%   Vitals:   09/27/19 1529 09/27/19 2355 09/28/19 0800 09/28/19 0931  BP: 120/60 (!) 125/55 (!) 116/49   Pulse: 95 74 91   Resp: 16 18 17    Temp: 98.7 F (37.1 C) 97.8 F (36.6 C) 98.4 F (36.9 C)   TempSrc: Axillary Oral    SpO2: 95% 93% (!) 78% 97%  Weight:      Height:        General: Pt is alert, awake, not in acute distress, anasarca Cardiovascular: RRR, S1/S2 +, no rubs, no gallops Respiratory: CTA bilaterally, no wheezing, no rhonchi Abdominal: Soft, NT, ND, bowel sounds +drecreased Extremities: +edema b/l     The results of significant diagnostics from this hospitalization (including imaging, microbiology, ancillary and laboratory) are listed  below for reference.     Microbiology: No results found for this or any previous visit (from the past 240 hour(s)).   Labs: BNP (last 3 results) Recent Labs    09/08/19 1205  BNP 878.6*   Basic Metabolic Panel: Recent Labs  Lab 09/22/19 1047 09/22/19 1047 09/23/19 0615 09/23/19 0615 09/24/19 0309 09/25/19 7672 09/26/19 0435 09/27/19 0609 09/28/19 0421  NA 142   < > 142  --  144 144 144 142  --   K 3.6   < > 3.3*  --  3.3* 3.4* 3.9 4.0  --   CL 102   < > 103  --  104 102 102 103  --   CO2 29   < > 30  --  31 31 31  32  --   GLUCOSE 167*   < > 154*  --  140* 157* 167* 139*  --   BUN 78*   < > 85*  --  82* 81* 84* 90*  --   CREATININE 3.12*   < > 3.25*  --  3.15* 3.23* 3.36* 3.70*  --   CALCIUM 8.2*   < > 8.6*  --  8.3* 8.4* 8.6* 8.6*  --   MG 2.1   < > 1.9   < > 2.0 2.1 2.0 2.1 2.2  PHOS 4.3  --  4.1  --   --   --   --  3.9  --    < > = values in this interval not displayed.   Liver Function Tests: Recent Labs  Lab 09/27/19 0609  ALBUMIN 2.5*   No results for input(s): LIPASE, AMYLASE in the last 168 hours. No results for input(s): AMMONIA in the last 168 hours. CBC: Recent  Labs  Lab 09/22/19 1047 09/23/19 0615 09/24/19 0309 09/25/19 0639 09/26/19 0435  WBC 13.5* 12.6* 13.4* 16.0* 17.9*  NEUTROABS 11.5* 10.6* 11.5* 14.3* 15.9*  HGB 7.7* 8.0* 7.7* 7.6* 7.9*  HCT 25.0* 25.2* 24.2* 25.3* 25.5*  MCV 100.4* 98.1 99.2 103.3* 102.8*  PLT 97* 103* 115* 120* 131*   Cardiac Enzymes: No results for input(s): CKTOTAL, CKMB, CKMBINDEX, TROPONINI in the last 168 hours. BNP: Invalid input(s): POCBNP CBG: Recent Labs  Lab 09/27/19 1142 09/27/19 1623 09/27/19 2036 09/28/19 0754 09/28/19 1204  GLUCAP 157* 148* 132* 142* 148*   D-Dimer No results for input(s): DDIMER in the last 72 hours. Hgb A1c No results for input(s): HGBA1C in the last 72 hours. Lipid Profile No results for input(s): CHOL, HDL, LDLCALC, TRIG, CHOLHDL, LDLDIRECT in the last 72  hours. Thyroid function studies No results for input(s): TSH, T4TOTAL, T3FREE, THYROIDAB in the last 72 hours.  Invalid input(s): FREET3 Anemia work up No results for input(s): VITAMINB12, FOLATE, FERRITIN, TIBC, IRON, RETICCTPCT in the last 72 hours. Urinalysis    Component Value Date/Time   COLORURINE AMBER (A) 09/08/2019 1205   APPEARANCEUR CLOUDY (A) 09/08/2019 1205   LABSPEC 1.015 09/08/2019 1205   PHURINE 5.0 09/08/2019 1205   GLUCOSEU NEGATIVE 09/08/2019 1205   HGBUR NEGATIVE 09/08/2019 1205   [place] 09/08/2019 1205   [place] 09/08/2019 1205   PROTEINUR 30 (A) 09/08/2019 1205   NITRITE NEGATIVE 09/08/2019 1205   LEUKOCYTESUR NEGATIVE 09/08/2019 1205   Sepsis Labs Invalid input(s): PROCALCITONIN,  WBC,  LACTICIDVEN Microbiology No results found for this or any previous visit (from the past 240 hour(s)).   Time coordinating discharge: Over 30 minutes  SIGNED:   Nolberto Hanlon, MD  [facility] 09/28/2019, 12:15 PM Pager   If 7PM-7AM, please contact night-coverage [email or web address] Password [email or web address]

## 2019-09-28 NOTE — Progress Notes (Signed)
EMS called for transport to [facility].

## 2019-09-28 NOTE — Progress Notes (Signed)
Patient is hospice will contact Santiago Glad to see if she would like patient on [facility] he is   09/28/19 0800  Vitals  Temp 98.4 F (36.9 C)  BP (!) 116/49  MAP (mmHg) 69  BP Location Left Arm  BP Method Automatic  Patient Position (if appropriate) Lying  Pulse Rate 91  Pulse Rate Source Monitor  Resp 17  MEWS COLOR  MEWS Score Color Green  Oxygen Therapy  SpO2 (!) 78 %  MEWS Score  MEWS Temp 0  MEWS Systolic 0  MEWS Pulse 0  MEWS RR 0  MEWS LOC 0  MEWS Score 0   satting between 56-78%

## 2019-09-28 NOTE — Progress Notes (Signed)
[facility] hospital Liaison note:  Follow up visit made to new referral for [facility] hospice home. Eligibility confirmed by hospice physician Dr. Eulas Post.  Writer spoke via telephone to patient's daughter Daneil Dan to offer the bed, which she has accepted. Hospital care team updated. Plan is for transport to the hospice home today. Signed out of facility DNR, MOST form and Advanced directives in place in discharge packet. Report called to the hospice home. Staff RN Ander Purpura has called EMS for transport.  Will continue to follow thru discharge. Flo Shanks BSN, RN, [place] [phone]

## 2019-09-28 NOTE — Progress Notes (Signed)
PT Cancellation Note  Patient Details Name: Creedence Kunesh MRN: [id number] DOB: 06-17-1940   Cancelled Treatment:    Reason Eval/Treat Not Completed: Other (comment): Discharge rehab orders at this time per MD request with pt transferring to hospice house.  Will complete PT orders at this time but will reassess pt pending a change in status upon receipt of new PT orders.    Linus Salmons PT, DPT 09/28/19, 11:42 AM

## 2019-09-28 NOTE — Progress Notes (Signed)
Patient discharged to Hospice facility , via stretcher, Iv intact. And Foley present with patient

## 2019-10-12 DEATH — deceased

## 2021-12-08 IMAGING — DX DG CHEST 1V PORT
1 series · 1 of 1 positions shown · non-contrast
Comparison: Portable exam 4223 hours without priors for comparison

CLINICAL DATA: Oxygen desaturation, hypoglycemia, shortness of
breath, history COPD, CHF

EXAM:
PORTABLE CHEST 1 VIEW

[chest ap]
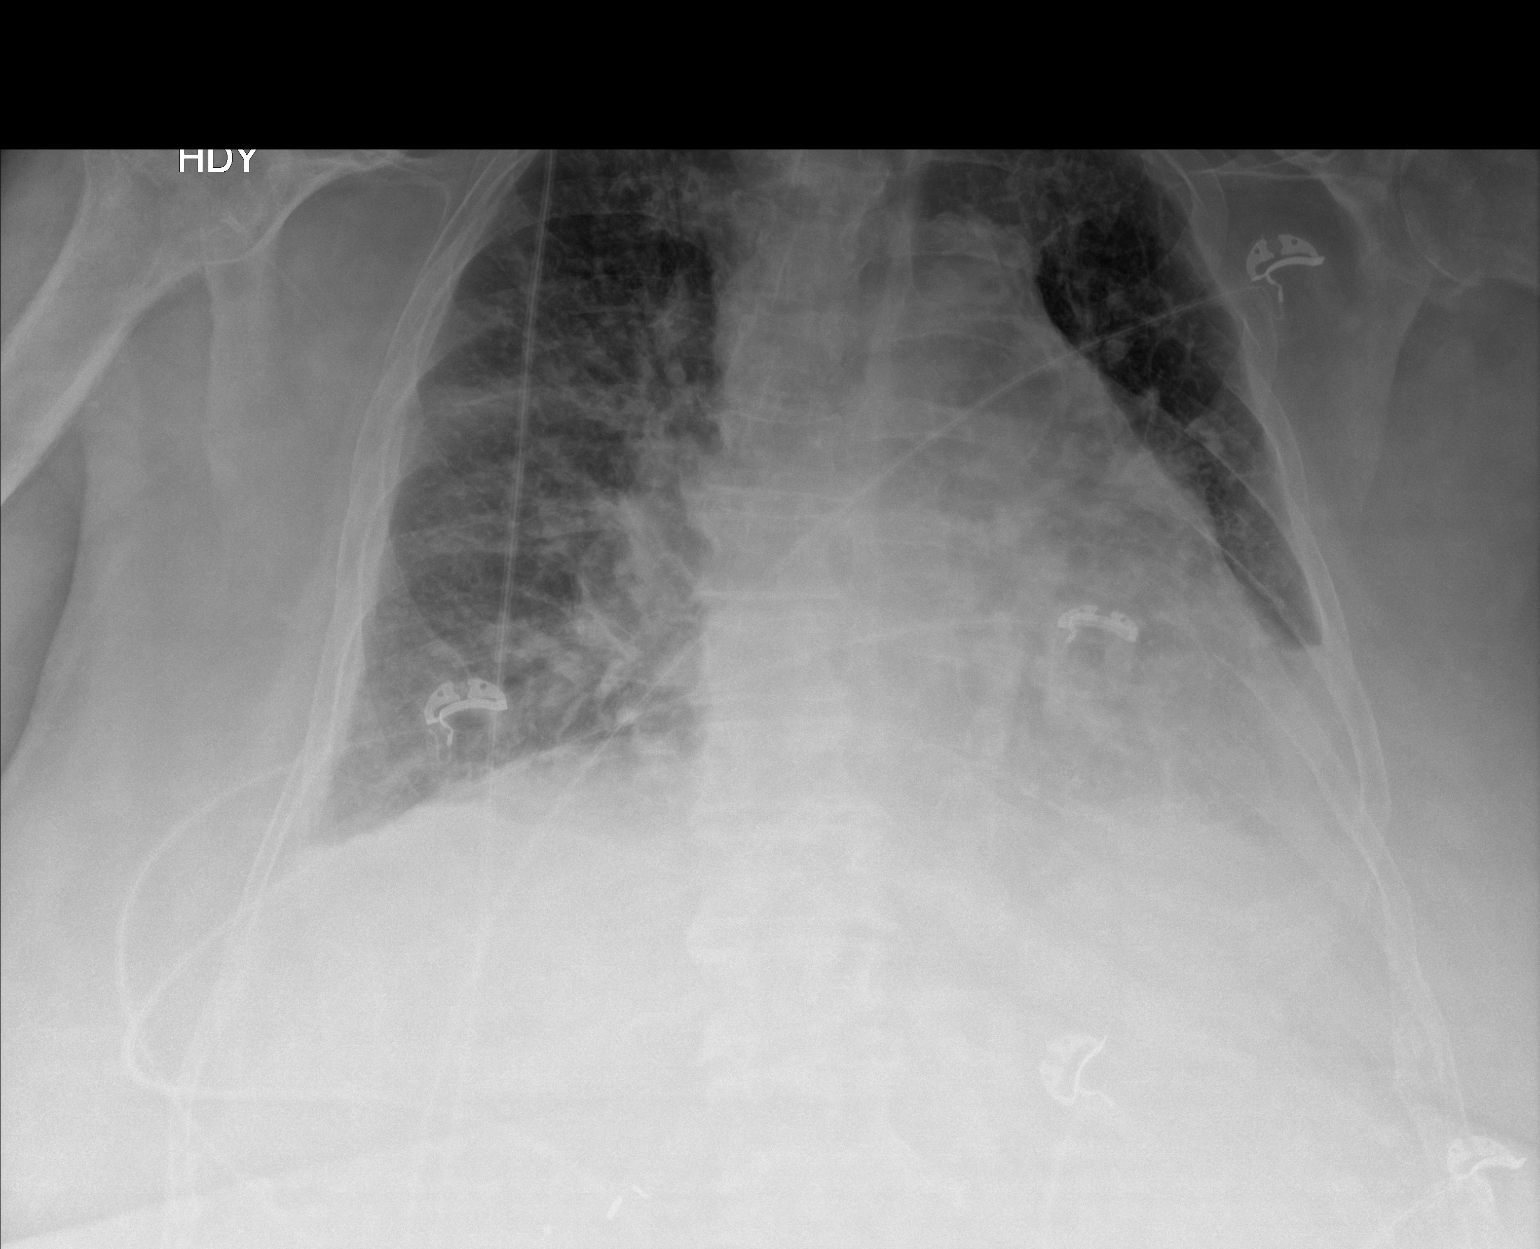

[1 of 1 positions shown; findings below may reference images not displayed]

FINDINGS: Upper normal heart size.

Mediastinal contours normal.

Patchy infiltrates bilaterally greater on RIGHT, question pneumonia,
asymmetric edema considered less likely.

Probable small LEFT pleural effusion.

No pneumothorax.
IMPRESSION: BILATERAL pulmonary infiltrates favoring pneumonia.

Probable small LEFT pleural effusion.

## 2021-12-09 IMAGING — US US EXTREM LOW VENOUS
1 series · 14 of 24 positions shown · non-contrast
Comparison: None.

CLINICAL DATA: Swelling in the bilateral legs, rule out DVT.

EXAM:
BILATERAL LOWER EXTREMITY VENOUS DOPPLER ULTRASOUND
TECHNIQUE: Gray-scale sonography with compression, as well as color and duplex
ultrasound, were performed to evaluate the deep venous system(s)
from the level of the common femoral vein through the popliteal and
proximal calf veins.

[Series 1: us venous img lower bilat (dvt) · portal-venous · 14 of 70 slices shown]
[im 1/70]
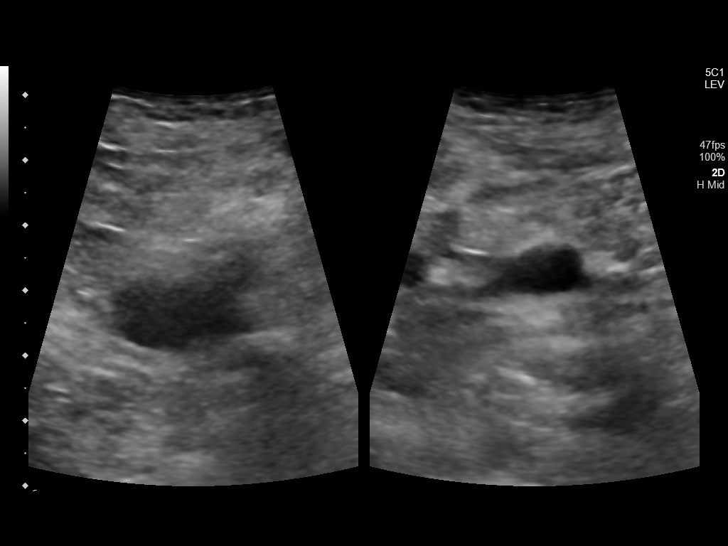
[im 7/70]
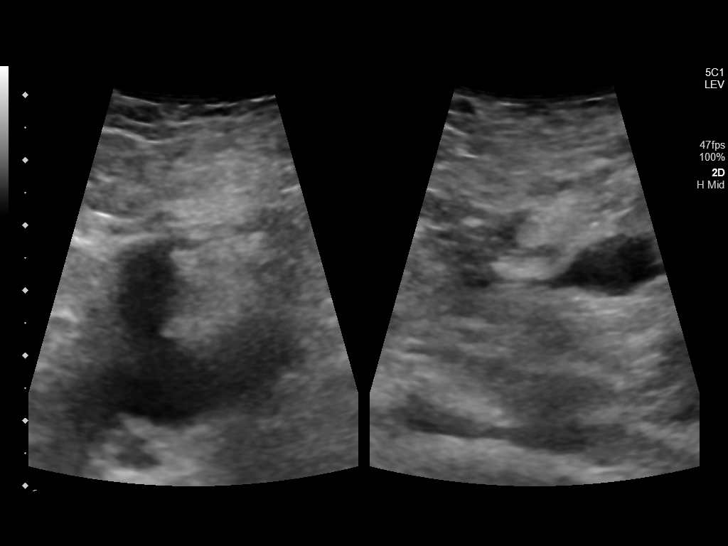
[im 13/70]
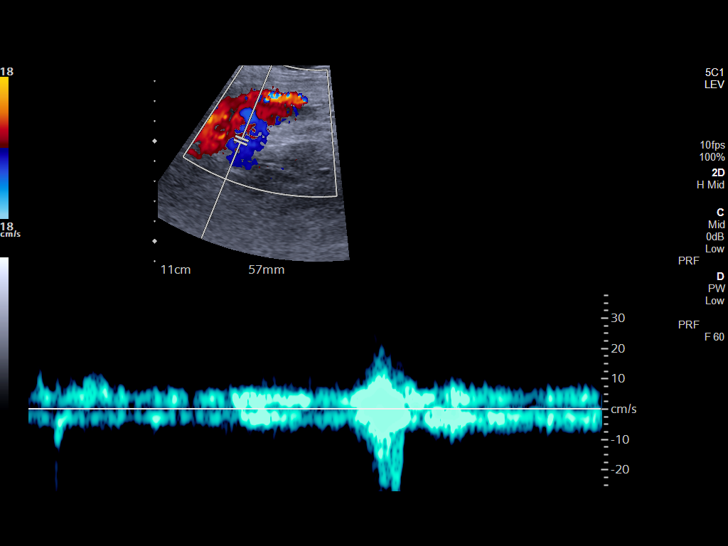
[im 19/70]
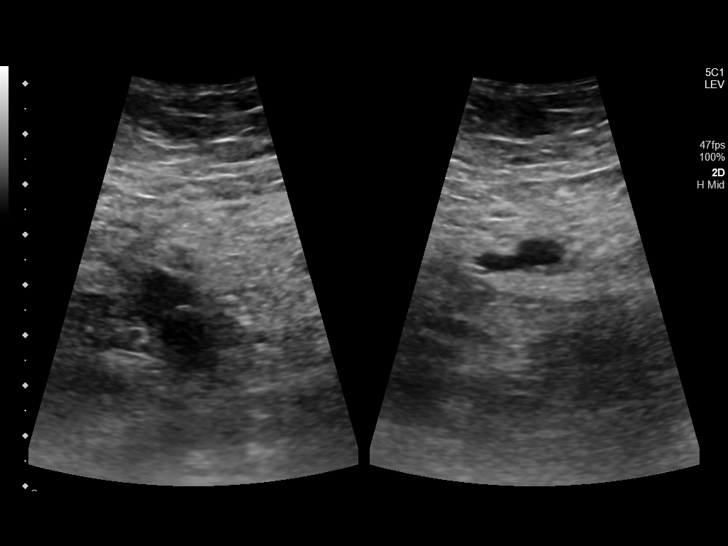
[im 22/70]
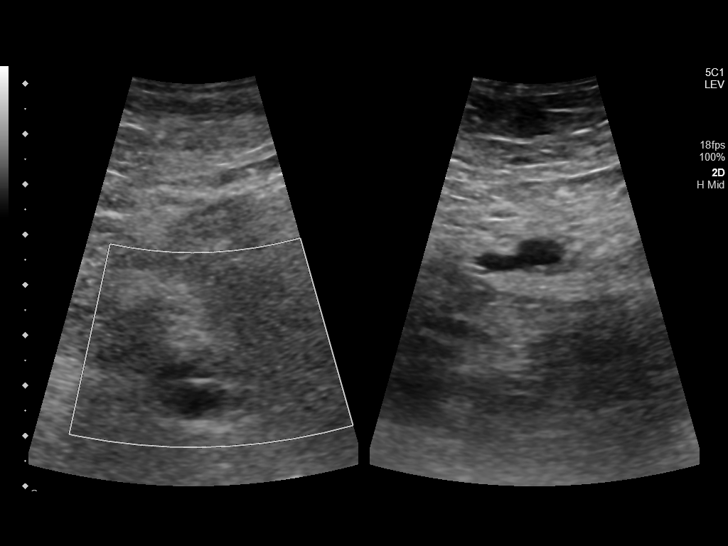
[im 28/70]
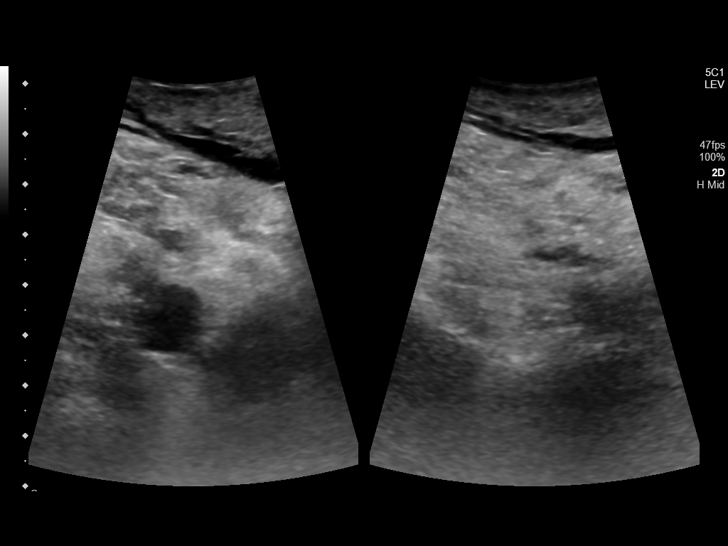
[im 34/70]
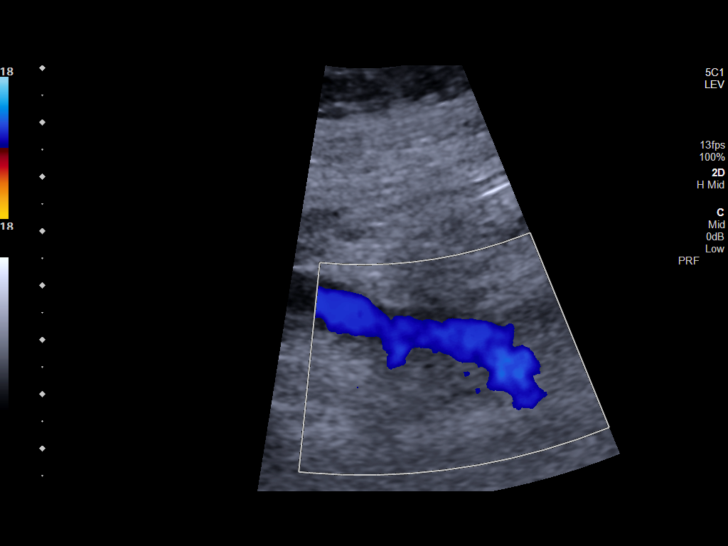
[im 37/70]
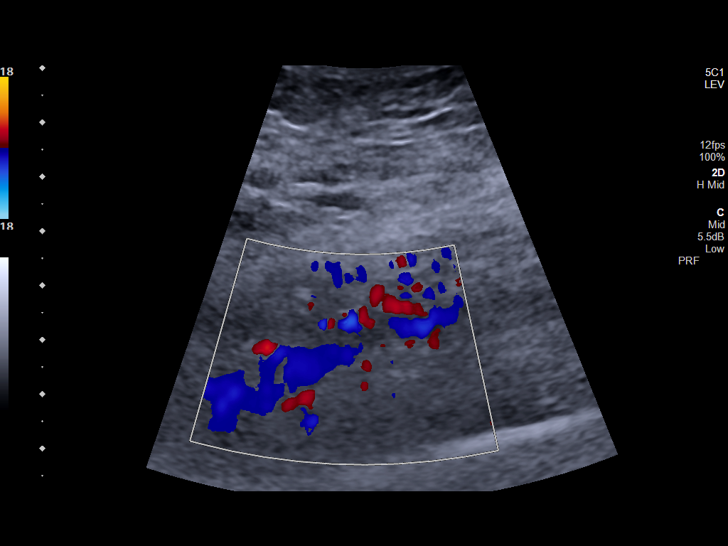
[im 43/70]
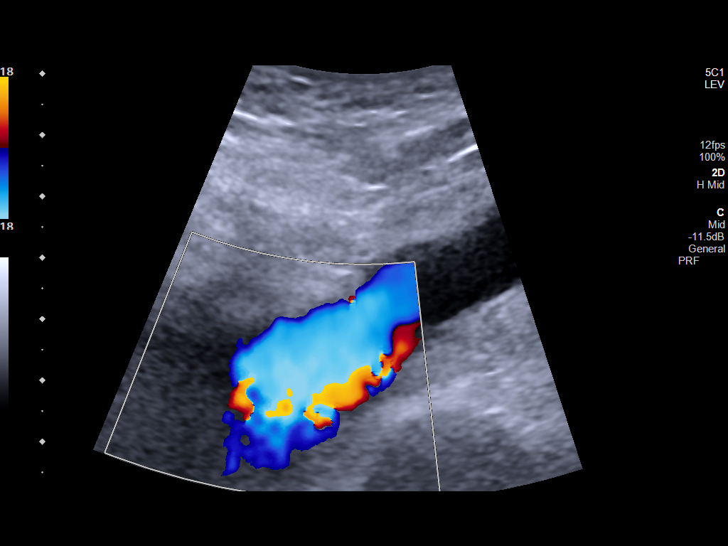
[im 49/70]
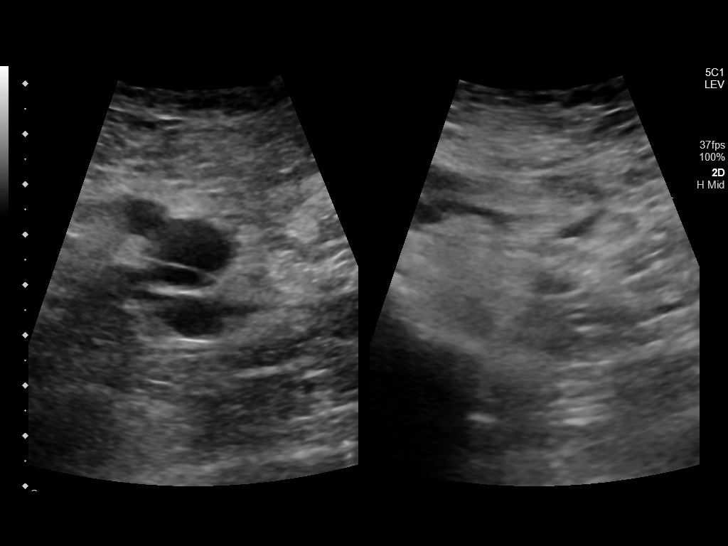
[im 55/70]
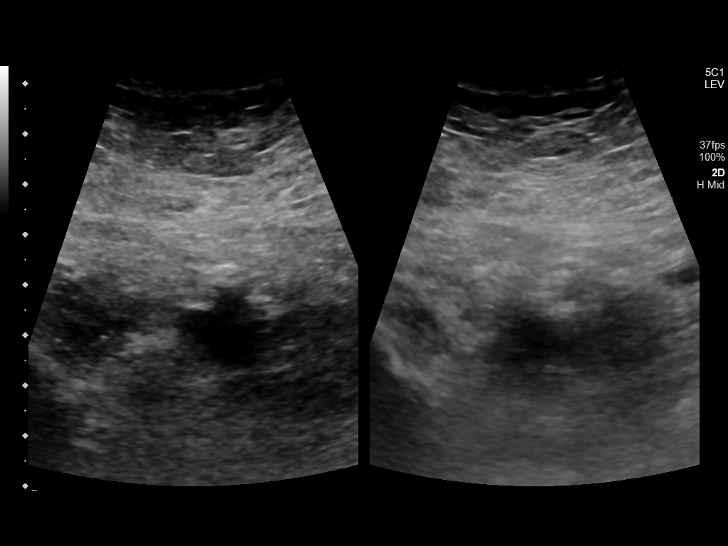
[im 58/70]
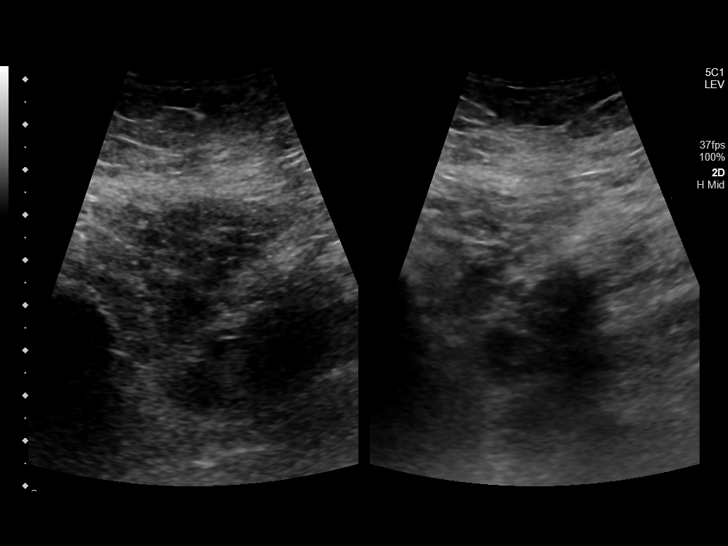
[im 64/70]
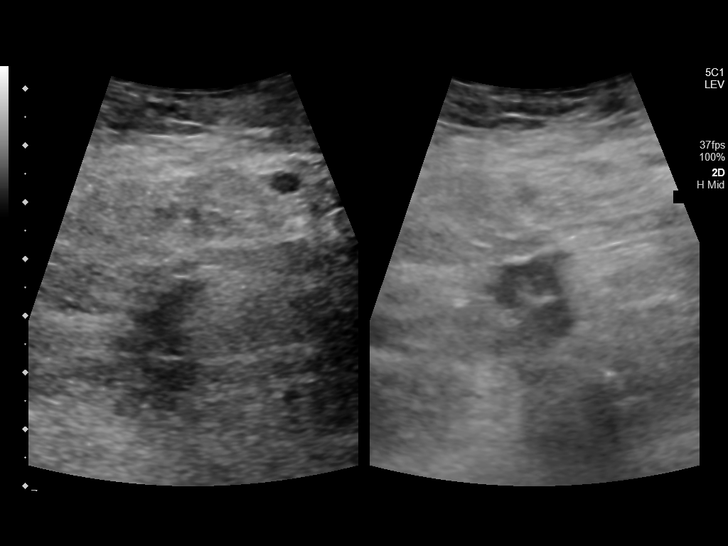
[im 70/70]
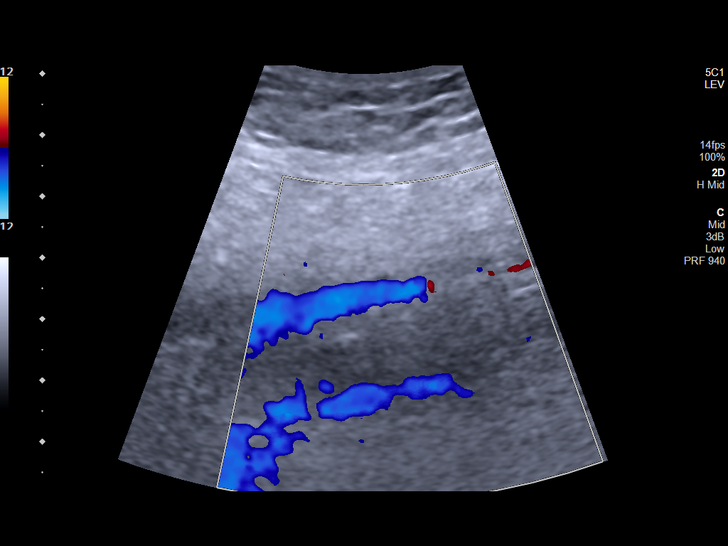

[14 of 24 positions shown; findings below may reference images not displayed]

FINDINGS: VENOUS

Normal compressibility of the common femoral, superficial femoral,
and popliteal veins, as well as the visualized calf veins.
Visualized portions of profunda femoral vein and great saphenous
vein unremarkable. No filling defects to suggest DVT on grayscale or
color Doppler imaging. Doppler waveforms show normal direction of
venous flow, normal respiratory plasticity and response to
augmentation.

Limited views of the contralateral common femoral vein are
unremarkable.

OTHER

None.

Limitations: none
IMPRESSION: Negative for DVT in the bilateral lower extremities.

## 2021-12-09 IMAGING — DX DG CHEST 1V PORT
1 series · 1 of 1 positions shown · non-contrast
Comparison: 09/08/2019

CLINICAL DATA: Central line placement, history of CHF

EXAM:
PORTABLE CHEST 1 VIEW

[chest ap]
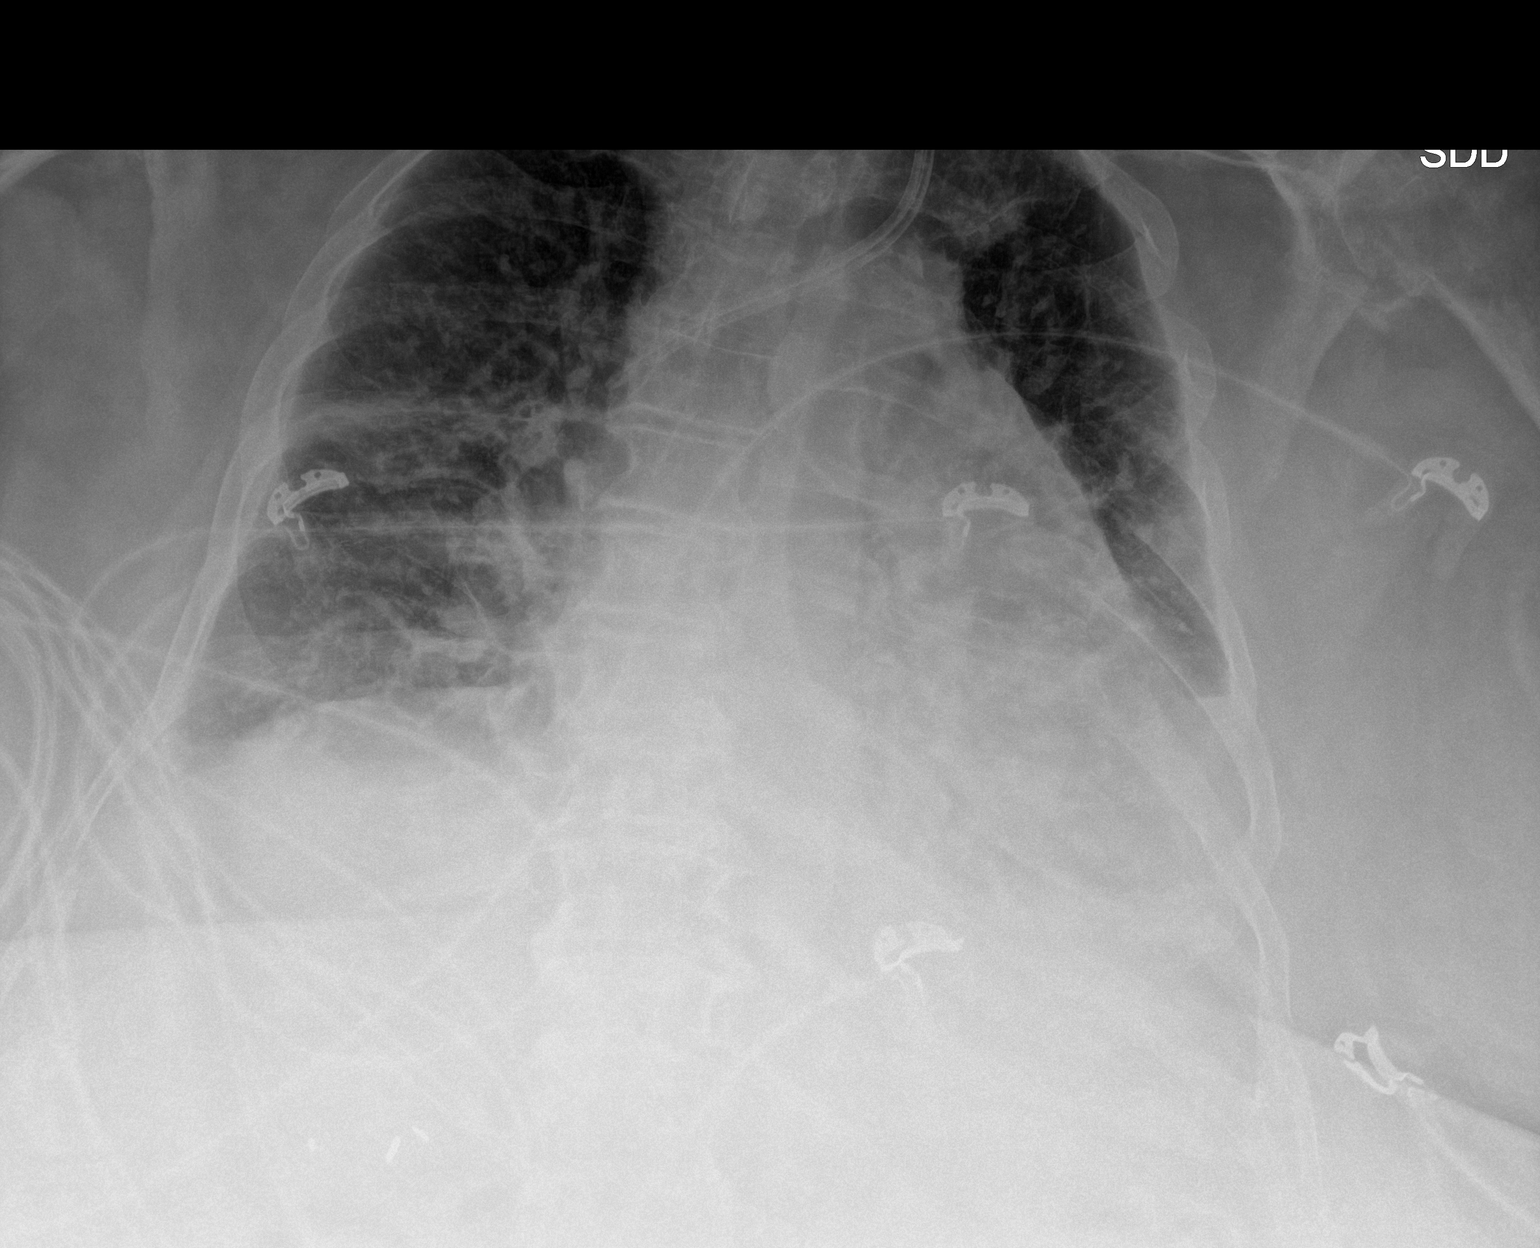

[1 of 1 positions shown; findings below may reference images not displayed]

FINDINGS: Left IJ double-lumen central line tip overlies the SVC. No
pneumothorax. There is similar lung aeration with patchy bilateral
opacities. Probable small bilateral pleural effusions. Top-normal
heart size. Possible enlargement of the main pulmonary artery.
IMPRESSION: Interval left IJ line placement with tip overlying SVC. No
pneumothorax. Lung aeration is not substantially changed.

## 2021-12-14 IMAGING — DX DG CHEST 1V PORT
1 series · 1 of 1 positions shown · non-contrast
Comparison: Radiographs earlier the same date and 09/11/2019.

CLINICAL DATA: PICC line placement.

EXAM:
PORTABLE CHEST 1 VIEW

[chest ap]
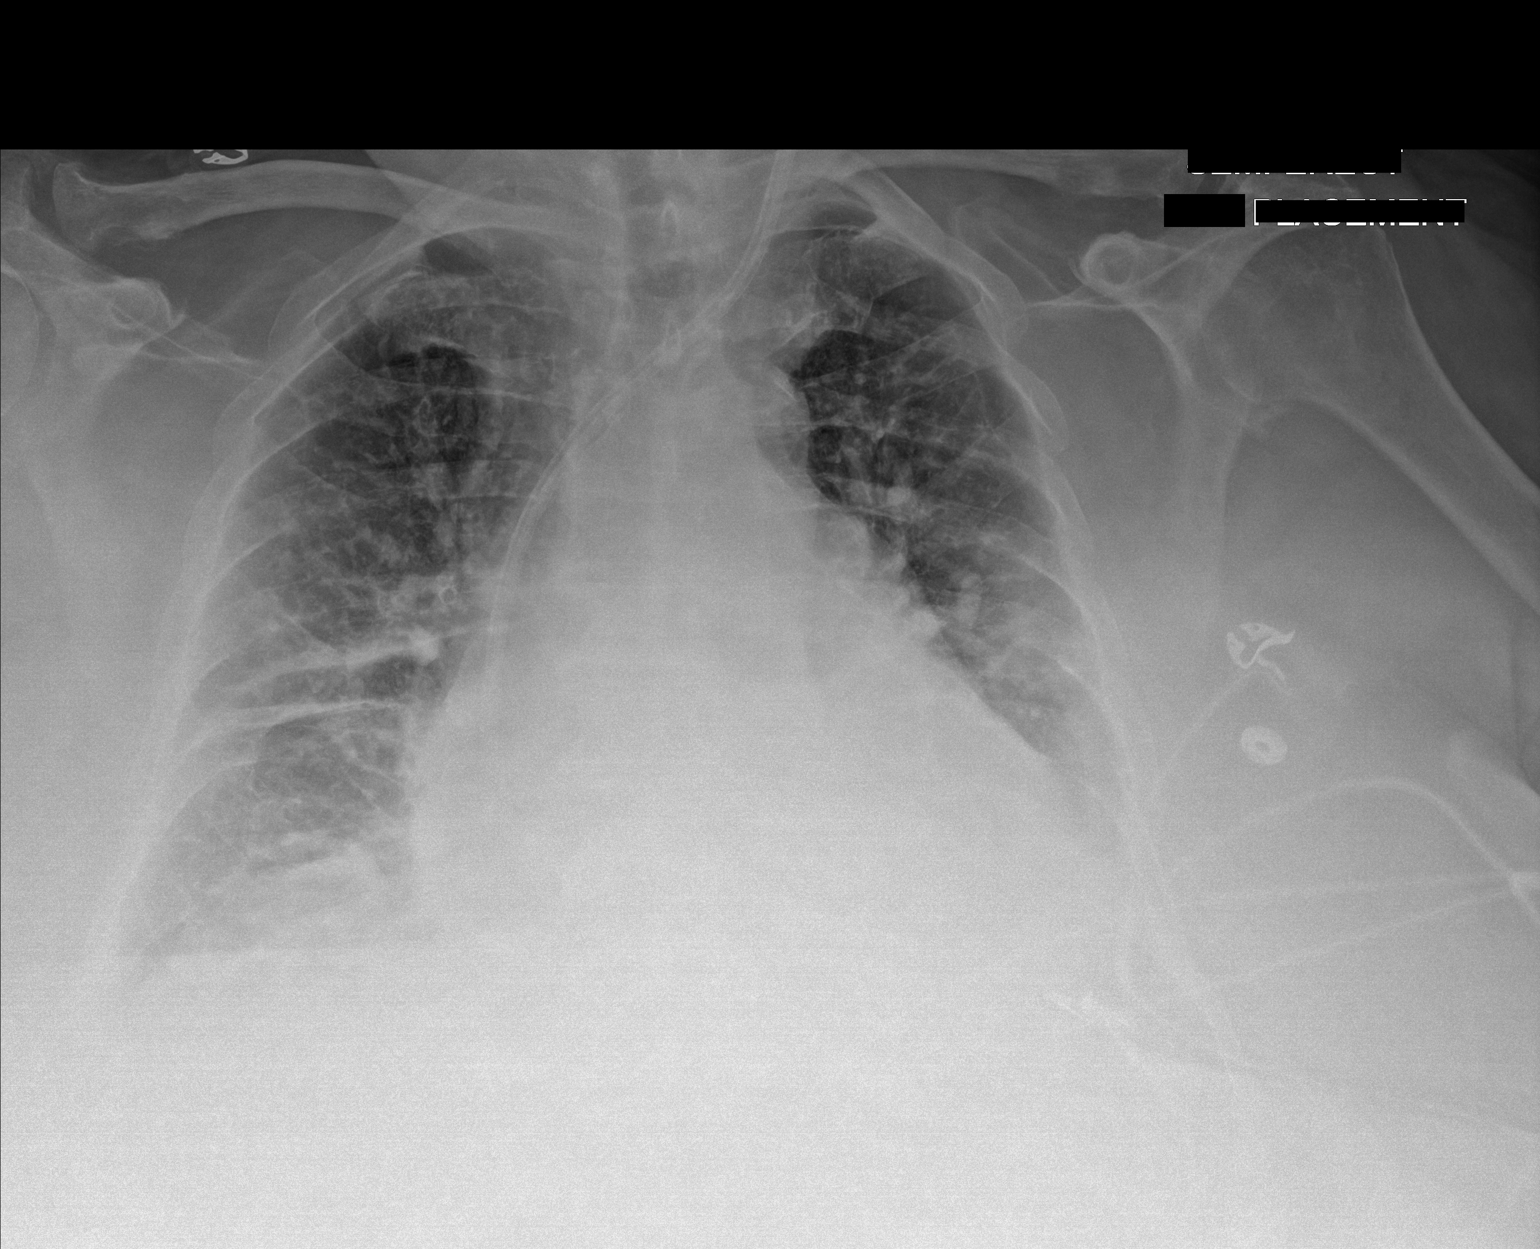

[1 of 1 positions shown; findings below may reference images not displayed]

FINDINGS: 6064 hours. There is a new left IJ hemodialysis catheter, projecting
to the level of the mid right atrium. No visible PICC line. Stable
cardiomegaly, aortic atherosclerosis and mild pulmonary edema. There
are probable small bilateral pleural effusions. No pneumothorax. The
bones appear unchanged.
IMPRESSION: 1. New left IJ hemodialysis catheter tip projects to the level of
the mid right atrium. No visible PICC line.
2. Stable cardiomegaly, mild pulmonary edema and probable small
bilateral pleural effusions.

## 2021-12-14 IMAGING — DX DG CHEST 1V PORT
1 series · 1 of 1 positions shown · non-contrast
Comparison: 09/11/2019

CLINICAL DATA: Acute respiratory failure with hypoxic

EXAM:
PORTABLE CHEST 1 VIEW

[chest ap]
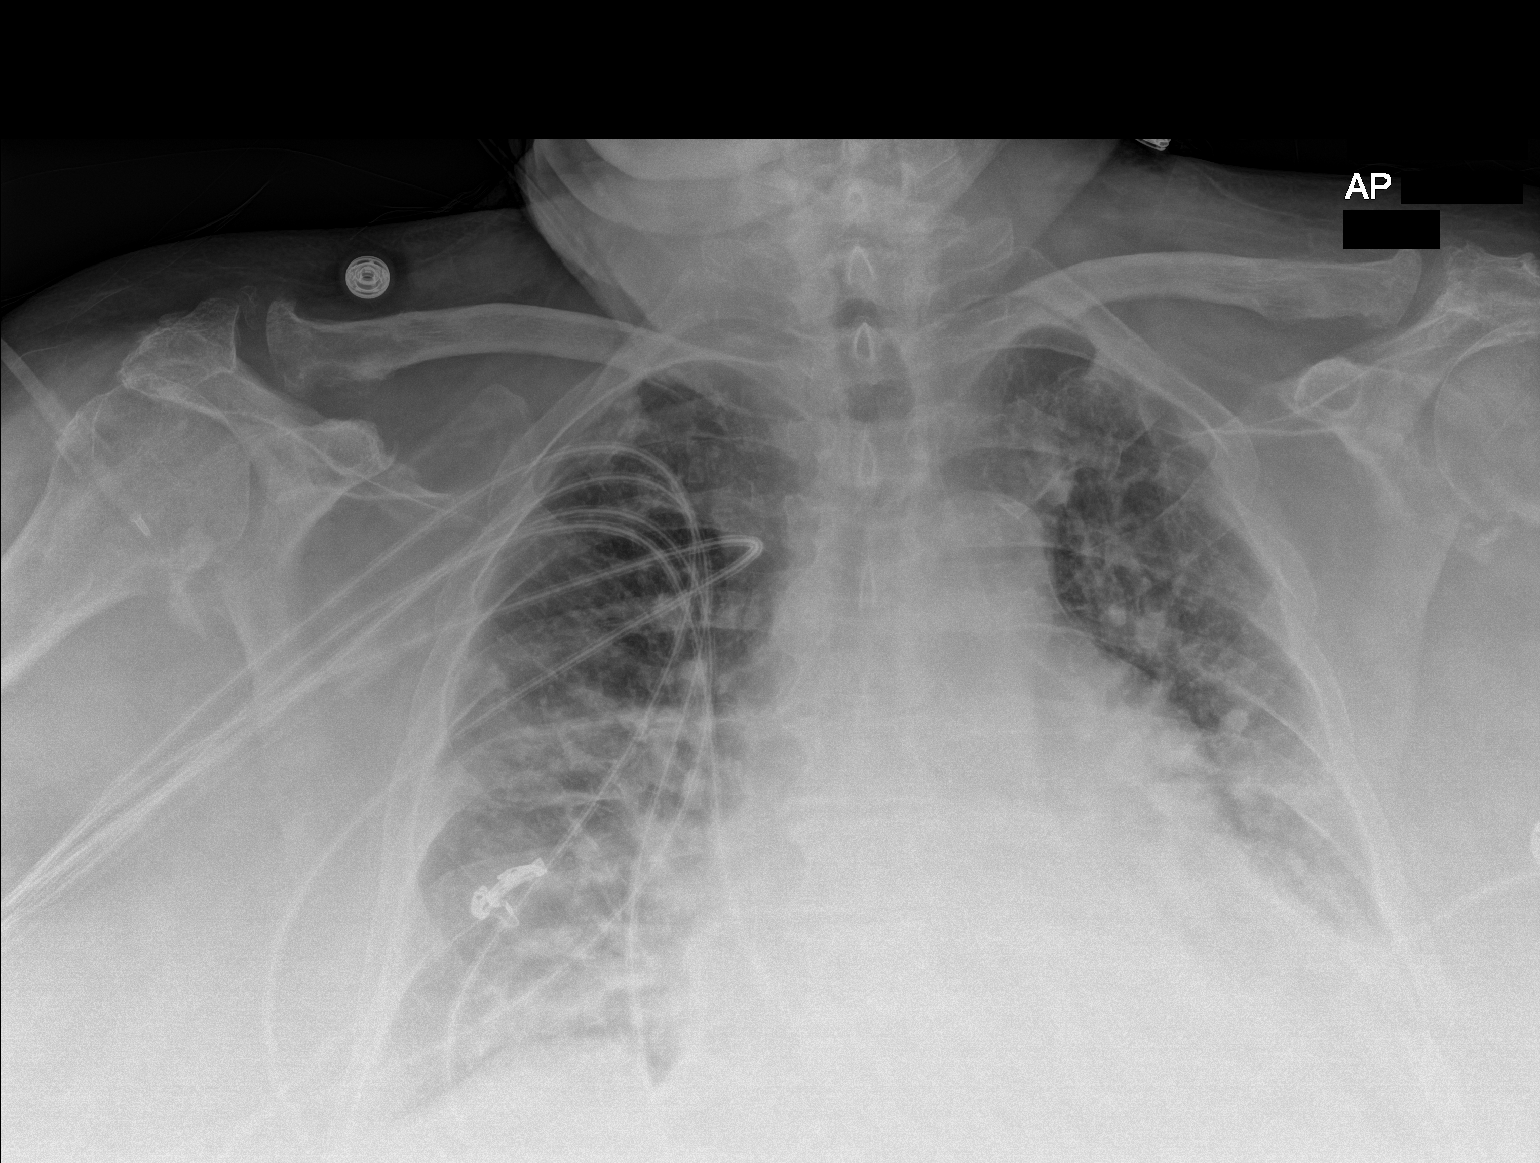

[1 of 1 positions shown; findings below may reference images not displayed]

FINDINGS: Cardiomegaly and bilateral airspace opacities are again noted.

No large pleural effusion or pneumothorax identified.

No acute bony abnormalities are present.

There has been little interval change since prior study except for
removal of a LEFT IJ central venous catheter.
IMPRESSION: Little significant change in bilateral airspace opacities and
cardiomegaly.
